# Patient Record
Sex: Female | Born: 1945 | Race: White | Hispanic: No | Marital: Married | Smoking: Current some day smoker
Health system: Southern US, Community
[De-identification: ages and names within clinical notes are randomized; demographics above are authoritative.]

## PROBLEM LIST (undated history)

## (undated) DIAGNOSIS — I10 Essential (primary) hypertension: Secondary | ICD-10-CM

## (undated) DIAGNOSIS — J309 Allergic rhinitis, unspecified: Secondary | ICD-10-CM

## (undated) DIAGNOSIS — IMO0002 Reserved for concepts with insufficient information to code with codable children: Secondary | ICD-10-CM

## (undated) DIAGNOSIS — K449 Diaphragmatic hernia without obstruction or gangrene: Secondary | ICD-10-CM

## (undated) DIAGNOSIS — N3020 Other chronic cystitis without hematuria: Secondary | ICD-10-CM

## (undated) DIAGNOSIS — D1803 Hemangioma of intra-abdominal structures: Secondary | ICD-10-CM

## (undated) DIAGNOSIS — K2289 Other specified disease of esophagus: Secondary | ICD-10-CM

## (undated) DIAGNOSIS — Z8719 Personal history of other diseases of the digestive system: Secondary | ICD-10-CM

## (undated) DIAGNOSIS — E43 Unspecified severe protein-calorie malnutrition: Secondary | ICD-10-CM

## (undated) DIAGNOSIS — K219 Gastro-esophageal reflux disease without esophagitis: Secondary | ICD-10-CM

## (undated) DIAGNOSIS — I209 Angina pectoris, unspecified: Secondary | ICD-10-CM

## (undated) DIAGNOSIS — I712 Thoracic aortic aneurysm, without rupture, unspecified: Secondary | ICD-10-CM

## (undated) HISTORY — PX: WISDOM TOOTH EXTRACTION: SHX21

## (undated) HISTORY — DX: Allergic rhinitis, unspecified: J30.9

## (undated) HISTORY — DX: Diaphragmatic hernia without obstruction or gangrene: K44.9

## (undated) HISTORY — DX: Hemangioma of intra-abdominal structures: D18.03

## (undated) HISTORY — DX: Thoracic aortic aneurysm, without rupture, unspecified: I71.20

## (undated) HISTORY — DX: Essential (primary) hypertension: I10

## (undated) HISTORY — DX: Gastro-esophageal reflux disease without esophagitis: K21.9

## (undated) HISTORY — DX: Unspecified severe protein-calorie malnutrition: E43

## (undated) HISTORY — PX: ABDOMINAL HYSTERECTOMY: SHX81

## (undated) HISTORY — PX: CHOLECYSTECTOMY: SHX55

## (undated) HISTORY — DX: Other specified disease of esophagus: K22.89

---

## 1998-05-13 ENCOUNTER — Other Ambulatory Visit: Admission: RE | Admit: 1998-05-13 | Discharge: 1998-05-13 | Payer: Self-pay | Admitting: Gynecology

## 1999-08-04 ENCOUNTER — Emergency Department (HOSPITAL_COMMUNITY): Admission: EM | Admit: 1999-08-04 | Discharge: 1999-08-04 | Payer: Self-pay | Admitting: Emergency Medicine

## 1999-08-04 ENCOUNTER — Encounter: Payer: Self-pay | Admitting: Emergency Medicine

## 2000-05-20 ENCOUNTER — Other Ambulatory Visit: Admission: RE | Admit: 2000-05-20 | Discharge: 2000-05-20 | Payer: Self-pay | Admitting: Gynecology

## 2000-06-29 ENCOUNTER — Encounter: Payer: Self-pay | Admitting: Internal Medicine

## 2000-06-29 ENCOUNTER — Encounter: Admission: RE | Admit: 2000-06-29 | Discharge: 2000-06-29 | Payer: Self-pay | Admitting: Internal Medicine

## 2000-07-06 ENCOUNTER — Encounter: Admission: RE | Admit: 2000-07-06 | Discharge: 2000-07-06 | Payer: Self-pay | Admitting: Internal Medicine

## 2000-07-06 ENCOUNTER — Encounter: Payer: Self-pay | Admitting: Internal Medicine

## 2000-07-12 ENCOUNTER — Ambulatory Visit (HOSPITAL_COMMUNITY): Admission: RE | Admit: 2000-07-12 | Discharge: 2000-07-13 | Payer: Self-pay | Admitting: General Surgery

## 2000-07-12 ENCOUNTER — Encounter: Payer: Self-pay | Admitting: General Surgery

## 2000-07-12 ENCOUNTER — Encounter (INDEPENDENT_AMBULATORY_CARE_PROVIDER_SITE_OTHER): Payer: Self-pay | Admitting: *Deleted

## 2001-02-21 ENCOUNTER — Observation Stay (HOSPITAL_COMMUNITY): Admission: RE | Admit: 2001-02-21 | Discharge: 2001-02-22 | Payer: Self-pay | Admitting: Urology

## 2001-06-01 ENCOUNTER — Other Ambulatory Visit: Admission: RE | Admit: 2001-06-01 | Discharge: 2001-06-01 | Payer: Self-pay | Admitting: Obstetrics and Gynecology

## 2002-05-11 ENCOUNTER — Ambulatory Visit (HOSPITAL_COMMUNITY): Admission: RE | Admit: 2002-05-11 | Discharge: 2002-05-11 | Payer: Self-pay | Admitting: Gastroenterology

## 2002-05-11 ENCOUNTER — Encounter (INDEPENDENT_AMBULATORY_CARE_PROVIDER_SITE_OTHER): Payer: Self-pay | Admitting: Specialist

## 2003-03-08 ENCOUNTER — Encounter: Admission: RE | Admit: 2003-03-08 | Discharge: 2003-03-08 | Payer: Self-pay | Admitting: Internal Medicine

## 2003-03-08 ENCOUNTER — Encounter: Payer: Self-pay | Admitting: Internal Medicine

## 2004-08-22 ENCOUNTER — Ambulatory Visit: Payer: Self-pay | Admitting: Family Medicine

## 2005-06-04 ENCOUNTER — Ambulatory Visit: Payer: Self-pay | Admitting: Family Medicine

## 2005-06-08 ENCOUNTER — Ambulatory Visit: Payer: Self-pay | Admitting: Family Medicine

## 2005-06-30 ENCOUNTER — Ambulatory Visit: Payer: Self-pay | Admitting: Family Medicine

## 2005-08-07 ENCOUNTER — Ambulatory Visit: Payer: Self-pay | Admitting: Family Medicine

## 2005-10-02 ENCOUNTER — Ambulatory Visit: Payer: Self-pay | Admitting: Family Medicine

## 2009-08-19 ENCOUNTER — Encounter: Admission: RE | Admit: 2009-08-19 | Discharge: 2009-08-19 | Payer: Self-pay | Admitting: Internal Medicine

## 2010-12-19 NOTE — Op Note (Signed)
[facility]  Patient:    Julie Day, Julie Day                       MRN: [id number] Proc. Date: 02/21/01 Adm. Date:  [id number] Attending:  Laqueta Jean                           Operative Report  PREOPERATIVE DIAGNOSIS:  Pelvic prolapse.  POSTOPERATIVE DIAGNOSES:  Urethrocele, cystocele, and enterocele.  OPERATION PERFORMED:  Pubovaginal sling using tutoplast "T" shaped fascia with anterior vaginal vault repair, and enterocele repair.  SURGEON:  Dr. Patsi Sears.  ANESTHESIA:  General endotracheal.  PREPARATION:  After appropriate preanesthesia, the patient was brought to the operating room and placed on the operating table in the dorsal supine position where general endotracheal anesthesia was introduced. She was then replaced in the low Allen stirrup dorsal lithotomy position where the pubis was prepped with Betadine solution and draped in the usual fashion.  DESCRIPTION OF PROCEDURE:  The 10 cc of Marcaine 0.25 with epinephrine 1:200,000 was injected into the large cystourethrocele area. Evaluation showed a large anterior vaginal vault prolapse. A Foley catheter was placed, and an incision was made from the mid urethra, to the scar from where the cervix had been removed during hysterectomy. The subcutaneous tissue was dissected with blunt and sharp dissection, and following this, a 2-0 PDS suture was placed through the remaining portion of the cardinal ligament. This was sutured in a horizontal mattress fashion.  A "T" shaped portion of tutoplast was then fashioned from a 6 x 8 rectangular piece of mentor fascia, and retropubic anchors were placed in a standard fashion w #1 Prolene sutures attached. A "T" shaped sling was then formed, and sutured in place with a right angle clamp around the fascia at the level of the urethra, to ensure that the sling was not sutured too tight.  It was rechecked again to make sure that the fascia was loose.  Following this, the "T" shaped portion of the tissue was sutured over the anterior vaginal vault repair, which was repaired with horizontal mattress 3-0 Vicryl sutures. Following this, the denuded vaginal epithelium was excised, and subcutaneous sutures and 3-0 Vicryl pop-offs were used and then the vaginal epithelium was was closed with inverted running 3-0 Vicryl suture.  A second large bulge was then identified, in the area of the enterocele and rectocele. The 10 more cc of 0.25 Marcaine with epinephrine 1:200,000 was injected, and a midline incision was made and subcutaneous tissue dissected with blunt and sharp dissection. This revealed a large enterocele and no rectocele. The enterocele sac was dissected, and a large circumferential 3-0 Vicryl suture was placed, and the enterocele was pushed back proximal-ward, and the suture ligated. Two portions of fascia then were placed in the vault area, and the denuded vaginal epithelium was again excised, and 3-0 Vicryl suture was then used in the subcutaneous tissue, and running 3-0 Vicryl suture was used to close the epithelial portion of the vaginal vault.  The patient tolerated the procedure well with mild to moderate bleeding. She remained stable during the procedure. There was no rectocele appreciated.  Following this, cystoscopy was accomplished, which showed normal trigone, and photo documentation was accomplished. Following this, a Foley catheter was placed, and vaginal packing was placed. The patient was awakened and taken to the recovery room in good condition. DD:  02/21/01 TD:  02/21/01 Job: [id number]

## 2010-12-19 NOTE — Op Note (Signed)
   NAME:  Julie Day, MEAS NO.:  [id number]   MEDICAL RECORD NO.:  [id number]                   PATIENT TYPE:  AMB   LOCATION:  ENDO                                 FACILITY:  [facility]   PHYSICIAN:  Danise Edge, M.D.                DATE OF BIRTH:  1946/01/04   DATE OF PROCEDURE:  05/11/2002  DATE OF DISCHARGE:                                 OPERATIVE REPORT   PROCEDURE:  Screening colonoscopy with polypectomy.   INDICATIONS:  The patient is a 65 year old female born 2045/12/23.  The  patient is scheduled to undergo her first screening colonoscopy with  polypectomy to prevent colon cancer.   ENDOSCOPIST:  Danise Edge, M.D.   PREMEDICATION:  Fentanyl 75 mcg, Versed 10 mg.   ENDOSCOPE:  Olympus pediatric colonoscope.   DESCRIPTION OF PROCEDURE:  After obtaining informed consent, the patient was  placed in the left lateral decubitus position.  I administered intravenous  fentanyl and intravenous Versed to achieve conscious sedation for the  procedure.  The patient's blood pressure, oxygen saturation, and cardiac  rhythm were monitored throughout the procedure and documented in the medical  record.   Anal inspection was normal.  Digital rectal exam was normal.  The Olympus  pediatric video colonoscope was introduced into the rectum and advanced to  the cecum.  Colonic preparation for the exam today was excellent.   Rectum and sigmoid colon:  Four 1 mm sessile polyps were removed from the  rectum and distal sigmoid colon with the hot biopsy forceps.  All polyps  were submitted in one bottle for pathologic evaluation.   Descending colon normal.   Splenic flexure normal.   Transverse colon normal.   Hepatic flexure normal.   Ascending colon normal.   Cecum and ileocecal valve normal.   ASSESSMENT:  Four 1 mm sessile polyps were removed from the distal sigmoid  colon and rectum.  All polyps were submitted in one bottle for pathologic  evaluation.    RECOMMENDATIONS:  If polyps return neoplastic pathologically, the patient  should undergo a repeat colonoscopy in five years.  If the polyps are non-  neoplastic pathologically, the patient should undergo a repeat colonoscopy  in approximately 10 years assuming her yearly stool Hemoccult card tests are  negative.                                                Danise Edge, M.D.    MJ/MEDQ  D:  05/11/2002  T:  05/11/2002  Job:  [id number]   cc:   Thora Lance, M.D.  [address] [place]  [place] [address]  Fax: [phone]

## 2012-10-20 ENCOUNTER — Other Ambulatory Visit: Payer: Self-pay | Admitting: Internal Medicine

## 2012-10-20 DIAGNOSIS — M5432 Sciatica, left side: Secondary | ICD-10-CM

## 2012-10-26 ENCOUNTER — Ambulatory Visit
Admission: RE | Admit: 2012-10-26 | Discharge: 2012-10-26 | Disposition: A | Discharge status: Home / Self Care | Payer: Medicare Other | Source: Ambulatory Visit | Attending: Internal Medicine | Admitting: Internal Medicine

## 2012-10-26 DIAGNOSIS — M5432 Sciatica, left side: Secondary | ICD-10-CM

## 2013-07-09 ENCOUNTER — Emergency Department (HOSPITAL_COMMUNITY): Payer: Medicare Other

## 2013-07-09 ENCOUNTER — Emergency Department (HOSPITAL_COMMUNITY)
Admission: EM | Admit: 2013-07-09 | Discharge: 2013-07-09 | Disposition: A | Discharge status: Home / Self Care | Payer: Medicare Other | Attending: Emergency Medicine | Admitting: Emergency Medicine

## 2013-07-09 ENCOUNTER — Encounter (HOSPITAL_COMMUNITY): Payer: Self-pay | Admitting: Emergency Medicine

## 2013-07-09 DIAGNOSIS — Z885 Allergy status to narcotic agent status: Secondary | ICD-10-CM | POA: Insufficient documentation

## 2013-07-09 DIAGNOSIS — M62830 Muscle spasm of back: Secondary | ICD-10-CM

## 2013-07-09 DIAGNOSIS — M79609 Pain in unspecified limb: Secondary | ICD-10-CM | POA: Insufficient documentation

## 2013-07-09 DIAGNOSIS — Z79899 Other long term (current) drug therapy: Secondary | ICD-10-CM | POA: Insufficient documentation

## 2013-07-09 DIAGNOSIS — IMO0002 Reserved for concepts with insufficient information to code with codable children: Secondary | ICD-10-CM | POA: Insufficient documentation

## 2013-07-09 DIAGNOSIS — Z87440 Personal history of urinary (tract) infections: Secondary | ICD-10-CM | POA: Insufficient documentation

## 2013-07-09 DIAGNOSIS — M62838 Other muscle spasm: Secondary | ICD-10-CM | POA: Insufficient documentation

## 2013-07-09 DIAGNOSIS — F17200 Nicotine dependence, unspecified, uncomplicated: Secondary | ICD-10-CM | POA: Insufficient documentation

## 2013-07-09 HISTORY — DX: Reserved for concepts with insufficient information to code with codable children: IMO0002

## 2013-07-09 HISTORY — DX: Other chronic cystitis without hematuria: N30.20

## 2013-07-09 MED ORDER — METHOCARBAMOL 500 MG PO TABS: 500.0000 mg | ORAL_TABLET | Freq: Two times a day (BID) | ORAL | Status: DC

## 2013-07-09 NOTE — ED Provider Notes (Signed)
CSN: [id number]     Arrival date & time 07/09/13  1129 History  This chart was scribed for Raymon Mutton, PA working with Ethelda Chick, MD by Quintella Reichert, ED Scribe. This patient was seen in room TR06C/TR06C and the patient's care was started at 2:07 PM.  Chief Complaint  Patient presents with  . Back Pain    The history is provided by the patient and medical records. No language interpreter was used.    HPI Comments: Julie Day is a 67 y.o. female with h/o degenerative disc disease who presents to the Emergency Department complaining of severe left-sided lower back pain that began 2 day ago.  Pt states she has "horrible" spasms in her back whenever she tries to stand up or get out of bed.  Pain is predominantly on the left side but extends all the way across her lower back.  Pt does have a h/o back pain due to degenerative disc disease but states her current pain feels more muscular.  She denies recent falls or injuries but notes that 4 days ago she was sitting on the floor playing with her granddaughter.  She denies weakness, numbness or tingling in legs.  She denies bowel or bladder dysfunction.  Pt has a PCP and states that he is aware of her back pain.  She is not taking any medications for back pain currenlty.  She was referred to an orthopedist for an MRI and received an epidural in April 2014 which provided significant relief.  She has not seen her orthopedist since then.  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  PCP: Lillia Mountain, MD   Past Medical History  Diagnosis Date  . Degenerative disc disease   . Chronic cystitis     Past Surgical History  Procedure Laterality Date  . Abdominal hysterectomy    . Cholecystectomy      History reviewed. No pertinent family history.   History  Substance Use Topics  .  Smoking status: Current Every Day Smoker    Types: Cigarettes  . Smokeless tobacco: Not on file  . Alcohol Use: No    OB History   Grav Para Term Preterm Abortions TAB SAB Ect Mult Living                  Review of Systems  Gastrointestinal: Negative for diarrhea.  Genitourinary: Negative for enuresis.  Musculoskeletal: Positive for back pain.  Neurological: Negative for weakness and numbness.  All other systems reviewed and are negative.     Allergies  Codeine  Home Medications   Current Outpatient Rx  Name  Route  Sig  Dispense  Refill  . acetaminophen (TYLENOL) 325 MG tablet   Oral   Take 650 mg by mouth every 6 (six) hours as needed for moderate pain.         . Calcium Citrate-Vitamin D (CITRACAL + D PO)   Oral   Take 1 tablet by mouth daily.         . cetirizine (ZYRTEC) 10 MG tablet   Oral   Take 10 mg by mouth daily.         Marland Kitchen estradiol (ESTRACE) 0.5 MG tablet   Oral   Take 0.5 mg by mouth daily.         Marland Kitchen ibuprofen (ADVIL,MOTRIN) 200 MG tablet   Oral  Take 400 mg by mouth every 6 (six) hours as needed for moderate pain.         Marland Kitchen lansoprazole (PREVACID) 30 MG capsule   Oral   Take 30 mg by mouth every evening.         . mirabegron ER (MYRBETRIQ) 50 MG TB24 tablet   Oral   Take 50 mg by mouth at bedtime.         . Omega-3 Fatty Acids (FISH OIL) 1200 MG CAPS   Oral   Take 2,400 mg by mouth daily.         Marland Kitchen OVER THE COUNTER MEDICATION   Oral   Take 100 mg by mouth daily. Stool Softner         . triamterene-hydrochlorothiazide (MAXZIDE-25) 37.5-25 MG per tablet   Oral   Take 1 tablet by mouth every evening.         . methocarbamol (ROBAXIN) 500 MG tablet   Oral   Take 1 tablet (500 mg total) by mouth 2 (two) times daily.   20 tablet   0     BP 139/89  Pulse 62  Temp(Src) 97.8 F (36.6 C) (Oral)  Resp 20  SpO2 99%  Physical Exam  Nursing note and vitals reviewed. Constitutional: She is oriented to person,  place, and time. She appears well-developed and well-nourished. No distress.  HENT:  Head: Normocephalic and atraumatic.  Eyes: Conjunctivae and EOM are normal. Pupils are equal, round, and reactive to light. Right eye exhibits no discharge. Left eye exhibits no discharge.  Neck: Normal range of motion. Neck supple.  Negative neck stiffness Negative nuchal rigidity  Negative pain upon palpation to the cervical spine   Cardiovascular: Normal rate, regular rhythm and normal heart sounds.  Exam reveals no friction rub.   No murmur heard. Pulmonary/Chest: Effort normal and breath sounds normal. No respiratory distress. She has no wheezes. She has no rales.  Musculoskeletal: Normal range of motion. She exhibits tenderness.       Back:  Negative bulging, erythema, inflammation, deformities noted to the lumbosacral spine-discomfort upon palpation to mid spinal region. Discomfort upon palpation to lumbosacral spine, mid spinal and bilateral paravertebral regions. Full range of motion to upper and lower extremities identified without difficulty.  Neurological: She is alert and oriented to person, place, and time. She exhibits normal muscle tone. Coordination normal.  Strength 5+5+ upper and lower tremors bilaterally with resistance applied, equal distribution noted Sensation intact  Skin: Skin is warm and dry. She is not diaphoretic.  Psychiatric: She has a normal mood and affect. Her behavior is normal. Thought content normal.    ED Course  Procedures (including critical care time)  DIAGNOSTIC STUDIES: Oxygen Saturation is 99% on room air, normal by my interpretation.    COORDINATION OF CARE: 2:13 PM-Discussed treatment plan which includes muscle relaxer, heat, massage, and f/u with PCP and orthopedics.  Advised return precautions.  Pt expressed understanding and agreed with plan.  Dg Lumbar Spine Complete  07/09/2013   CLINICAL DATA:  Lower back and left leg pain.  EXAM: LUMBAR SPINE -  COMPLETE 4+ VIEW  COMPARISON:  None.  FINDINGS: No fracture or spondylolisthesis is noted. Mild degenerative disc disease is noted at L2-3, L3-4 and L4-5. Minimal levoscoliosis of lumbar spine is noted. Status post cholecystectomy. Mild degenerative disc disease is seen involving the posterior facet joints of L4-5 and L5-S1.  IMPRESSION: Mild multilevel degenerative disc disease. No acute abnormality seen in the lumbar spine.  Electronically Signed   By: Roque Lias M.D.   On: 07/09/2013 13:33   *RADIOLOGY REPORT*  Clinical Data: Left lower extremity radiculopathy. Pain begins in  the groin radiates to the ankle. Left sciatica.  MRI LUMBAR SPINE WITHOUT CONTRAST  Technique: Multiplanar and multiecho pulse sequences of the lumbar  spine were obtained without intravenous contrast.  Comparison: None.  Findings: Normal conus tip at L2. Normal paraspinal soft tissues.  T12-L1 and L1-2: Normal.  L2-3: Slight disc space narrowing. Tiny broad-based disc bulge  with no neural impingement.  L3-4: Slight disc space narrowing. Small broad-based disc bulge  with no neural impingement.  L4-5: Disc space narrowing. Small broad-based disc bulge with  accompanying osteophytes most prominent lateral to the right neural  foramen adjacent to but not compressing the L4 nerve after it has  exited the neural foramen. Slight degenerative changes of the  right facet joint.  L5 S1: Tiny central annular tear and central disc bulge with no  neural impingement. The bulge extends to the left but there is no  neural impingement. Moderate bilateral facet arthritis.  IMPRESSION:  1. 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 Asymmetric bulge  of the disc to the left without neural impingement at L5-S1.  2. No other significant abnormalities.  Original Report Authenticated By: Francene Boyers, M.D.   Labs Review Labs Reviewed - No data to display   Imaging Review Dg Lumbar Spine Complete  07/09/2013   CLINICAL  DATA:  Lower back and left leg pain.  EXAM: LUMBAR SPINE - COMPLETE 4+ VIEW  COMPARISON:  None.  FINDINGS: No fracture or spondylolisthesis is noted. Mild degenerative disc disease is noted at L2-3, L3-4 and L4-5. Minimal levoscoliosis of lumbar spine is noted. Status post cholecystectomy. Mild degenerative disc disease is seen involving the posterior facet joints of L4-5 and L5-S1.  IMPRESSION: Mild multilevel degenerative disc disease. No acute abnormality seen in the lumbar spine.   Electronically Signed   By: Roque Lias M.D.   On: 07/09/2013 13:33    EKG Interpretation   None       MDM   1. Muscle spasm of back     Filed Vitals:   07/09/13 1144  BP: 139/89  Pulse: 62  Temp: 97.8 F (36.6 C)  TempSrc: Oral  Resp: 20  SpO2: 99%   I personally performed the services described in this documentation, which was scribed in my presence. The recorded information has been reviewed and is accurate.  Patient presenting to the ED with low back pain that has been ongoing for the past couple of days, described as more of a muscular discomfort. Reported that she has history of DDD and back pain intermittently. This provider reviewed the patient's chart. Patient has a history of back pain. Most recent MRI of the lumbar spine noted moderate bilateral facet arthritis at L5-S1 with asymmetric bulging of the disc to the left without neural impingement at L5-S1. Patient reported that she was being seen by an orthopedic where epidural shots were administered for pain control. Reported that her last dose was in 11/2012.  Alert and oriented. GCS 15. Heart rate and rhythm normal. Lungs clear to auscultation to upper and lower lobes. Negative deformities noted to the spine. Discomfort upon palpation to the lumbosacral spine - mid-spinal and bilateral paravertebral. Full ROM to upper and lower extremities bilaterally. Mild discomfort noted with motion - sitting to standing position, flexion of the torso.  Strength intact with resistance, equal distribution noted. Negative  neurological deficits noted.  Plain film of lumbar spine noted DDD. Patient had chronic back pain - acute exacerbation of chronic back pain, suspicion high for muscular discomfort secondary to pain with palpation and motion. Doubt cauda equina. Doubt epidural abscess. Doubt nephrolithasis. Patient stable, afebrile. Discharged patient. Discharged patient with muscle relaxers. Referred patient to PCP and orthopedics. Discussed with patient to rest, stay hydrated, avoid strenuous activity. Discussed with patient to continue to monitor symptoms closely and if symptoms are to worsen or change to report back to the ED - strict return instructions given.  Patient agreed to plan of care, understood, all questions answered.   Raymon Mutton, PA-C 07/11/13 1349

## 2013-07-09 NOTE — ED Notes (Signed)
Pt reports left side lower back pain since Friday, hx of back pain. Denies injury or fall, ambulatory at triage.

## 2013-07-13 NOTE — ED Provider Notes (Signed)
Medical screening examination/treatment/procedure(s) were performed by non-physician practitioner and as supervising physician I was immediately available for consultation/collaboration.  EKG Interpretation   None        Martha K Linker, MD 07/13/13 1504 

## 2013-07-18 ENCOUNTER — Ambulatory Visit: Payer: Medicare Other | Attending: Family Medicine | Admitting: Physical Therapy

## 2013-07-18 DIAGNOSIS — R293 Abnormal posture: Secondary | ICD-10-CM | POA: Insufficient documentation

## 2013-07-18 DIAGNOSIS — IMO0001 Reserved for inherently not codable concepts without codable children: Secondary | ICD-10-CM | POA: Insufficient documentation

## 2013-07-18 DIAGNOSIS — R5381 Other malaise: Secondary | ICD-10-CM | POA: Insufficient documentation

## 2013-07-18 DIAGNOSIS — M545 Low back pain, unspecified: Secondary | ICD-10-CM | POA: Insufficient documentation

## 2013-07-19 ENCOUNTER — Ambulatory Visit: Payer: Medicare Other | Admitting: Physical Therapy

## 2013-07-24 ENCOUNTER — Ambulatory Visit: Payer: Medicare Other | Admitting: Physical Therapy

## 2013-07-25 ENCOUNTER — Ambulatory Visit: Payer: Medicare Other | Admitting: Physical Therapy

## 2013-07-31 ENCOUNTER — Ambulatory Visit: Payer: Medicare Other | Admitting: *Deleted

## 2013-08-02 ENCOUNTER — Ambulatory Visit: Payer: Medicare Other | Admitting: *Deleted

## 2013-08-07 ENCOUNTER — Ambulatory Visit: Payer: Medicare Other | Attending: Family Medicine | Admitting: Physical Therapy

## 2013-08-07 DIAGNOSIS — R293 Abnormal posture: Secondary | ICD-10-CM | POA: Insufficient documentation

## 2013-08-07 DIAGNOSIS — M545 Low back pain, unspecified: Secondary | ICD-10-CM | POA: Insufficient documentation

## 2013-08-07 DIAGNOSIS — IMO0001 Reserved for inherently not codable concepts without codable children: Secondary | ICD-10-CM | POA: Insufficient documentation

## 2013-08-07 DIAGNOSIS — R5381 Other malaise: Secondary | ICD-10-CM | POA: Insufficient documentation

## 2013-08-10 ENCOUNTER — Ambulatory Visit: Payer: Medicare Other | Admitting: Physical Therapy

## 2013-08-14 ENCOUNTER — Ambulatory Visit: Payer: Medicare Other | Admitting: Physical Therapy

## 2013-08-17 ENCOUNTER — Ambulatory Visit: Payer: Medicare Other | Admitting: Physical Therapy

## 2013-08-21 ENCOUNTER — Ambulatory Visit: Payer: Medicare Other | Admitting: Physical Therapy

## 2013-08-24 ENCOUNTER — Ambulatory Visit: Payer: Medicare Other | Admitting: Physical Therapy

## 2015-02-13 ENCOUNTER — Ambulatory Visit (INDEPENDENT_AMBULATORY_CARE_PROVIDER_SITE_OTHER): Payer: Medicare HMO | Admitting: Nurse Practitioner

## 2015-02-13 ENCOUNTER — Other Ambulatory Visit: Payer: Self-pay | Admitting: Gastroenterology

## 2015-02-13 ENCOUNTER — Encounter: Payer: Self-pay | Admitting: Nurse Practitioner

## 2015-02-13 VITALS — BP 128/78 | HR 58 | Ht 63.0 in | Wt 149.2 lb

## 2015-02-13 DIAGNOSIS — R1310 Dysphagia, unspecified: Secondary | ICD-10-CM

## 2015-02-13 DIAGNOSIS — R9439 Abnormal result of other cardiovascular function study: Secondary | ICD-10-CM | POA: Diagnosis not present

## 2015-02-13 LAB — TROPONIN I: Troponin I: <0.01 ng/mL (ref <0.06)

## 2015-02-13 NOTE — Addendum Note (Signed)
Addended by: Eulis Foster on: 02/13/2015 04:20 PM   Modules accepted: Orders

## 2015-02-13 NOTE — Addendum Note (Signed)
Addended by: Eulis Foster on: 02/13/2015 04:21 PM   Modules accepted: Orders

## 2015-02-13 NOTE — Patient Instructions (Addendum)
We will be checking the following labs today - stat Troponin, BMET   Medication Instructions:    Continue with your current medicines.  Use your NTG under your tongue for recurrent chest pain. May take one tablet every 5 minutes. If you are still having discomfort after 3 tablets in 15 minutes, call 911.     Testing/Procedures To Be Arranged:  Stress echo  Exercise Stress Echocardiogram An exercise stress echocardiogram is a heart (cardiac) test used to check the function of your heart. This test may also be called an exercise stress echocardiography or stress echo. This stress test will check how well your heart muscle and valves are working and determine if your heart muscle is getting enough blood. You will exercise on a treadmill to naturally increase or stress the functioning of your heart.  An echocardiogram uses sound waves (ultrasound) to produce an image of your heart. If your heart does not work normally, it may indicate coronary artery disease with poor coronary blood supply. The coronary arteries are the arteries that bring blood and oxygen to your heart. LET [facility] CARE PROVIDER KNOW ABOUT:  Any allergies you have.  All medicines you are taking, including vitamins, herbs, eye drops, creams, and over-the-counter medicines.  Previous problems you or members of your family have had with the use of anesthetics.  Any blood disorders you have.  Previous surgeries you have had.  Medical conditions you have.  Possibility of pregnancy, if this applies. RISKS AND COMPLICATIONS Generally, this is a safe procedure. However, as with any procedure, complications can occur. Possible complications can include:  You develop pain or pressure in the following areas:  Chest.  Jaw or neck.  Between your shoulder blades.  Radiating down your left arm.  Dizziness or lightheadedness.  Shortness of breath.  Increased or irregular heartbeat.  Nausea or vomiting.  Heart  attack (rare). BEFORE THE PROCEDURE  Avoid all forms of caffeine for 24 hours before your test or as directed by your health care provider. This includes coffee, tea (even decaffeinated tea), caffeinated sodas, chocolate, cocoa, and certain pain medicines.  Follow your health care provider's instructions regarding eating and drinking before the test.  Take your medicines as directed at regular times with water unless instructed otherwise. Exceptions may include:  If you have diabetes, ask how you are to take your insulin or pills. It is common to adjust insulin dosing the morning of the test.  If you are taking beta-blocker medicines, it is important to talk to your health care provider about these medicines well before the date of your test. Taking beta-blocker medicines may interfere with the test. In some cases, these medicines need to be changed or stopped 24 hours or more before the test.  If you wear a nitroglycerin patch, it may need to be removed prior to the test. Ask your health care provider if the patch should be removed before the test.  If you use an inhaler for any breathing condition, bring it with you to the test.  If you are an outpatient, bring a snack so you can eat right after the stress phase of the test.  Do not smoke for 4 hours prior to the test or as directed by your health care provider.  Wear loose-fitting clothes and comfortable shoes for the test. This test involves walking on a treadmill. PROCEDURE   Multiple electrodes will be put on your chest. If needed, small areas of your chest may be shaved to  get better contact with the electrodes. Once the electrodes are attached to your body, multiple wires will be attached to the electrodes, and your heart rate will be monitored.  You will have an echocardiogram done at rest.  To produce this image of your heart, gel is applied to your chest, and a wand-like tool (transducer) is moved over the chest. The transducer  sends the sound waves through the chest to create the moving images of your heart.  You may need an IV to receive a medication that improves the quality of the pictures.  You will then walk on a treadmill. The treadmill will be started at a slow pace. The treadmill speed and incline will gradually be increased to raise your heart rate.  At the peak of exercise, the treadmill will be stopped. You will lie down immediately on a bed so that a second echocardiogram can be done to visualize your heart's motion with exercise.  The test usually takes 30-60 minutes to complete. AFTER THE PROCEDURE  Your heart rate and blood pressure will be monitored after the test.  You may return to your normal schedule, including diet, activities, and medicines, unless your health care provider tells you otherwise.  Follow-Up:   Will see how your test turns out.    Other Special Instructions:   No Smoking!!  Call the [place] office at [phone] if you have any questions, problems or concerns.

## 2015-02-13 NOTE — Progress Notes (Addendum)
CARDIOLOGY OFFICE NOTE  Date:  0/62/3762    [name] Date of Birth: 25-Apr-1946 Medical Record #[id number]  PCP:  Irven Shelling, MD  Cardiologist:  Johnsie Cancel (new)  Chief Complaint  Patient presents with  . Chest Pain    Work in new patient visit. Seen for Dr. Johnsie Cancel (DOD)    History of Present Illness: Julie Day is a 69 y.o. female who presents today for a new patient visit. Will establish with Dr. Johnsie Cancel (DOD today).   She has a history of HTN. No history of DM, HLD or known CAD. She has had longstanding GERD. She is a smoker - previously 1 pack a day - now just a rare cigarette. FH negative for CAD.   Presents from Dr. Wynetta Emery of GI - has had 6 to 8 weeks of chest heaviness. Notes that it just "hurts". Worse with lying down. Better if upright. Saw her PCP - had her PPI increased and then sent to GI. Symptoms continue - maybe getting worse - certainly no better. Will last 2 to 3 hours or even up to 6 hours. It will just go away. Nothing that she can do to bring on and nothing she can do to make go away. No associated symptoms whatsoever. Not short of breath. Not lightheaded, clammy, nauseated and no radiation. She has NTG - given to her by PCP - she has not taken.    Past Medical History  Diagnosis Date  . Degenerative disc disease   . Chronic cystitis   . Hypertension   . GERD (gastroesophageal reflux disease)   . Allergic rhinitis   . Hemangioma of liver     Past Surgical History  Procedure Laterality Date  . Abdominal hysterectomy    . Cholecystectomy       Medications: Current Outpatient Prescriptions  Medication Sig Dispense Refill  . Calcium Citrate-Vitamin D (CITRACAL + D PO) Take 1 tablet by mouth daily.    . cetirizine (ZYRTEC) 10 MG tablet Take 10 mg by mouth daily.    Marland Kitchen estradiol (ESTRACE) 0.5 MG tablet Take 0.5 mg by mouth daily.    . nitroGLYCERIN (NITROSTAT) 0.4 MG SL tablet Place 0.4 mg under the tongue every 5 (five) minutes as needed  for chest pain.    Marland Kitchen OVER THE COUNTER MEDICATION Take 100 mg by mouth daily. Stool Softner    . oxybutynin (DITROPAN-XL) 5 MG 24 hr tablet Take 5 mg by mouth at bedtime.    . pantoprazole (PROTONIX) 40 MG tablet Take 40 mg by mouth 2 (two) times daily.    Marland Kitchen triamterene-hydrochlorothiazide (MAXZIDE-25) 37.5-25 MG per tablet Take 1 tablet by mouth every evening.     No current facility-administered medications for this visit.    Allergies: Allergies  Allergen Reactions  . Codeine Nausea And Vomiting    Social History: The patient  reports that she has been smoking Cigarettes.  She does not have any smokeless tobacco history on file. She reports that she does not drink alcohol or use illicit drugs.   Family History: The patient's family history includes Hypertension in her mother and sister.   Review of Systems: Please see the history of present illness.   Otherwise, the review of systems is positive for none.   All other systems are reviewed and negative.   Physical Exam: VS:  BP 128/78 mmHg  Pulse 58  Ht 5\' 3"  (1.6 m)  Wt 149 lb 3.2 oz (67.677 kg)  BMI 26.44 kg/m2 .  BMI Body mass index is 26.44 kg/(m^2).  Wt Readings from Last 3 Encounters:  02/13/15 149 lb 3.2 oz (67.677 kg)    General: Pleasant. Well developed, well nourished and in no acute distress.  HEENT: Normal. Neck: Supple, no JVD, carotid bruits, or masses noted.  Cardiac: Regular rate and rhythm. No murmurs, rubs, or gallops. No edema.  Respiratory:  Lungs are clear to auscultation bilaterally with normal work of breathing.  GI: Soft and nontender.  MS: No deformity or atrophy. Gait and ROM intact. Skin: Warm and dry. Color is normal.  Neuro:  Strength and sensation are intact and no gross focal deficits noted.  Psych: Alert, appropriate and with normal affect.   LABORATORY DATA:  EKG:  EKG is ordered today. This demonstrates sinus brady - rate of 58 - reviewed with Dr. Johnsie Cancel.  No results found for: WBC,  HGB, HCT, PLT, GLUCOSE, CHOL, TRIG, HDL, LDLDIRECT, LDLCALC, ALT, AST, NA, K, CL, CREATININE, BUN, CO2, TSH, PSA, INR, GLUF, HGBA1C, MICROALBUR  BNP (last 3 results) No results for input(s): BNP in the last 8760 hours.  ProBNP (last 3 results) No results for input(s): PROBNP in the last 8760 hours.   Other Studies Reviewed Today: N/A  Noted to have had negative stress test in 2001  Assessment/Plan: 1. Chest pain - her risk factors include HTN and smoking. Symptoms are more frequent. She has NTG on hand - she is encouraged to use prn - reminded how to use as well. Will check troponin today as well. Discussed with Dr. Johnsie Cancel - will proceed on with stress echo - scheduled for this Friday Am.   2. GERD - already on PPI - for barium swallow on Friday as well  3. HTN - BP good on current regimen.   4. Smoking - total cessation encouraged  Current medicines are reviewed with the patient today.  The patient does not have concerns regarding medicines other than what has been noted above.  The following changes have been made:  See above.  Labs/ tests ordered today include:    Orders Placed This Encounter  Procedures  . Basic metabolic panel  . Troponin I  . EKG 12-Lead  . ECHO STRESS TEST     Disposition:   FU based on outcomes of her testing.  Patient is agreeable to this plan and will call if any problems develop in the interim.   Signed: Burtis Junes, RN, ANP-C 02/13/2015 3:45 PM  [facility] [address] [place], [place]  [address] Phone: [phone] Fax: [phone]

## 2015-02-14 ENCOUNTER — Other Ambulatory Visit: Payer: Self-pay | Admitting: *Deleted

## 2015-02-14 ENCOUNTER — Telehealth (HOSPITAL_COMMUNITY): Payer: Self-pay

## 2015-02-14 ENCOUNTER — Telehealth: Payer: Self-pay | Admitting: *Deleted

## 2015-02-14 DIAGNOSIS — R079 Chest pain, unspecified: Secondary | ICD-10-CM

## 2015-02-14 LAB — BASIC METABOLIC PANEL
BUN: 13 mg/dL (ref 6–23)
CO2: 29 mEq/L (ref 19–32)
Calcium: 9.9 mg/dL (ref 8.4–10.5)
Chloride: 104 mEq/L (ref 96–112)
Creatinine, Ser: 0.91 mg/dL (ref 0.40–1.20)
GFR: 65.21 mL/min (ref >60.00)
Glucose, Bld: 84 mg/dL (ref 70–99)
Potassium: 3.8 mEq/L (ref 3.5–5.1)
Sodium: 141 mEq/L (ref 135–145)

## 2015-02-14 NOTE — Telephone Encounter (Signed)
Patient given detailed instructions per Stress Test Requisition Sheet for test on 02-15-2015 at 7:30am.Patient Notified to arrive 20 minutes early, and that it is imperative to arrive on time for appointment to keep from having the test rescheduled.  Patient verbalized understanding. Oletta Lamas, Bronwen Pendergraft A

## 2015-02-14 NOTE — Telephone Encounter (Signed)
Informed patient that insurance will not pay for stress echo testing. Will switch patient to GXT test - this insurance will pay for. Patient is relieved and agreeable to have test done prior to leaving [place] beach [place]. Was able to arrange testing tomorrow at hospital. Instructions given to patient and she verbalized understanding and is agreeable to changed plan.

## 2015-02-15 ENCOUNTER — Ambulatory Visit
Admission: RE | Admit: 2015-02-15 | Discharge: 2015-02-15 | Disposition: A | Discharge status: Home / Self Care | Payer: Medicare HMO | Source: Ambulatory Visit | Attending: Gastroenterology | Admitting: Gastroenterology

## 2015-02-15 ENCOUNTER — Ambulatory Visit (HOSPITAL_COMMUNITY)
Admission: RE | Admit: 2015-02-15 | Discharge: 2015-02-15 | Disposition: A | Discharge status: Home / Self Care | Payer: Medicare HMO | Source: Ambulatory Visit | Attending: Nurse Practitioner | Admitting: Nurse Practitioner

## 2015-02-15 ENCOUNTER — Ambulatory Visit (HOSPITAL_COMMUNITY): Payer: Medicare HMO

## 2015-02-15 DIAGNOSIS — R079 Chest pain, unspecified: Secondary | ICD-10-CM | POA: Diagnosis not present

## 2015-02-15 DIAGNOSIS — R1310 Dysphagia, unspecified: Secondary | ICD-10-CM

## 2015-02-20 ENCOUNTER — Other Ambulatory Visit: Payer: Self-pay | Admitting: Gastroenterology

## 2015-02-20 ENCOUNTER — Encounter (HOSPITAL_COMMUNITY): Payer: Self-pay | Admitting: *Deleted

## 2015-02-25 ENCOUNTER — Other Ambulatory Visit: Payer: Self-pay | Admitting: Gastroenterology

## 2015-02-26 ENCOUNTER — Ambulatory Visit (HOSPITAL_COMMUNITY): Payer: Medicare HMO | Admitting: Certified Registered Nurse Anesthetist

## 2015-02-26 ENCOUNTER — Encounter (HOSPITAL_COMMUNITY)
Admission: RE | Disposition: A | Discharge status: Home / Self Care | Payer: Self-pay | Source: Ambulatory Visit | Attending: Gastroenterology

## 2015-02-26 ENCOUNTER — Encounter (HOSPITAL_COMMUNITY): Payer: Self-pay

## 2015-02-26 ENCOUNTER — Ambulatory Visit (HOSPITAL_COMMUNITY)
Admission: RE | Admit: 2015-02-26 | Discharge: 2015-02-26 | Disposition: A | Discharge status: Home / Self Care | Payer: Medicare HMO | Source: Ambulatory Visit | Attending: Gastroenterology | Admitting: Gastroenterology

## 2015-02-26 DIAGNOSIS — Z79899 Other long term (current) drug therapy: Secondary | ICD-10-CM | POA: Diagnosis not present

## 2015-02-26 DIAGNOSIS — K440 Diaphragmatic hernia with obstruction, without gangrene: Secondary | ICD-10-CM | POA: Insufficient documentation

## 2015-02-26 DIAGNOSIS — J449 Chronic obstructive pulmonary disease, unspecified: Secondary | ICD-10-CM | POA: Diagnosis not present

## 2015-02-26 DIAGNOSIS — K219 Gastro-esophageal reflux disease without esophagitis: Secondary | ICD-10-CM | POA: Insufficient documentation

## 2015-02-26 DIAGNOSIS — R1319 Other dysphagia: Secondary | ICD-10-CM | POA: Diagnosis present

## 2015-02-26 DIAGNOSIS — Z85828 Personal history of other malignant neoplasm of skin: Secondary | ICD-10-CM | POA: Diagnosis not present

## 2015-02-26 DIAGNOSIS — R0789 Other chest pain: Secondary | ICD-10-CM | POA: Diagnosis present

## 2015-02-26 DIAGNOSIS — Z9049 Acquired absence of other specified parts of digestive tract: Secondary | ICD-10-CM | POA: Diagnosis not present

## 2015-02-26 DIAGNOSIS — I10 Essential (primary) hypertension: Secondary | ICD-10-CM | POA: Insufficient documentation

## 2015-02-26 DIAGNOSIS — Z87891 Personal history of nicotine dependence: Secondary | ICD-10-CM | POA: Insufficient documentation

## 2015-02-26 HISTORY — PX: ESOPHAGOGASTRODUODENOSCOPY (EGD) WITH PROPOFOL: SHX5813

## 2015-02-26 SURGERY — ESOPHAGOGASTRODUODENOSCOPY (EGD) WITH PROPOFOL
Anesthesia: Monitor Anesthesia Care

## 2015-02-26 MED ORDER — LACTATED RINGERS IV SOLN
INTRAVENOUS | Status: DC
  Administered 2015-02-26: 1000 mL via INTRAVENOUS

## 2015-02-26 MED ORDER — LIDOCAINE HCL (CARDIAC) 20 MG/ML IV SOLN
INTRAVENOUS | Status: AC
  Filled 2015-02-26: qty 5

## 2015-02-26 MED ORDER — SODIUM CHLORIDE 0.9 % IV SOLN: INTRAVENOUS | Status: DC

## 2015-02-26 MED ORDER — PROPOFOL 10 MG/ML IV BOLUS
INTRAVENOUS | Status: AC
  Filled 2015-02-26: qty 20

## 2015-02-26 MED ORDER — PROPOFOL 10 MG/ML IV BOLUS
INTRAVENOUS | Status: DC | PRN
  Administered 2015-02-26: 75 mg via INTRAVENOUS

## 2015-02-26 MED ORDER — LIDOCAINE HCL (CARDIAC) 20 MG/ML IV SOLN
INTRAVENOUS | Status: DC | PRN
  Administered 2015-02-26: 50 mg via INTRAVENOUS

## 2015-02-26 SURGICAL SUPPLY — 14 items

## 2015-02-26 NOTE — Discharge Instructions (Signed)
Conscious Sedation, Adult, Care After °Refer to this sheet in the next few weeks. These instructions provide you with information on caring for yourself after your procedure. Your health care provider may also give you more specific instructions. Your treatment has been planned according to current medical practices, but problems sometimes occur. Call your health care provider if you have any problems or questions after your procedure. °WHAT TO EXPECT AFTER THE PROCEDURE  °After your procedure: °· You may feel sleepy, clumsy, and have poor balance for several hours. °· Vomiting may occur if you eat too soon after the procedure. °HOME CARE INSTRUCTIONS °· Do not participate in any activities where you could become injured for at least 24 hours. Do not: °· Drive. °· Swim. °· Ride a bicycle. °· Operate heavy machinery. °· Cook. °· Use power tools. °· Climb ladders. °· Work from a high place. °· Do not make important decisions or sign legal documents until you are improved. °· If you vomit, drink water, juice, or soup when you can drink without vomiting. Make sure you have little or no nausea before eating solid foods. °· Only take over-the-counter or prescription medicines for pain, discomfort, or fever as directed by your health care provider. °· Make sure you and your family fully understand everything about the medicines given to you, including what side effects may occur. °· You should not drink alcohol, take sleeping pills, or take medicines that cause drowsiness for at least 24 hours. °· If you smoke, do not smoke without supervision. °· If you are feeling better, you may resume normal activities 24 hours after you were sedated. °· Keep all appointments with your health care provider. °SEEK MEDICAL CARE IF: °· Your skin is pale or bluish in color. °· You continue to feel nauseous or vomit. °· Your pain is getting worse and is not helped by medicine. °· You have bleeding or swelling. °· You are still sleepy or  feeling clumsy after 24 hours. °SEEK IMMEDIATE MEDICAL CARE IF: °· You develop a rash. °· You have difficulty breathing. °· You develop any type of allergic problem. °· You have a fever. °MAKE SURE YOU: °· Understand these instructions. °· Will watch your condition. °· Will get help right away if you are not doing well or get worse. °Document Released: 05/10/2013 Document Reviewed: 05/10/2013 °ExitCare® Patient Information ©2015 [employer]. This information is not intended to replace advice given to you by your health care provider. Make sure you discuss any questions you have with your health care provider. ° °Esophagogastroduodenoscopy °Care After °Refer to this sheet in the next few weeks. These instructions provide you with information on caring for yourself after your procedure. Your caregiver may also give you more specific instructions. Your treatment has been planned according to current medical practices, but problems sometimes occur. Call your caregiver if you have any problems or questions after your procedure.  °HOME CARE INSTRUCTIONS °· Do not eat or drink anything until the numbing medicine (local anesthetic) has worn off and your gag reflex has returned. You will know that the local anesthetic has worn off when you can swallow comfortably. °· Do not drive for 12 hours after the procedure or as directed by your caregiver. °· Only take medicines as directed by your caregiver. °SEEK MEDICAL CARE IF:  °· You cannot stop coughing. °· You are not urinating at all or less than usual. °SEEK IMMEDIATE MEDICAL CARE IF: °· You have difficulty swallowing. °· You cannot eat or drink. °·   You have worsening throat or chest pain. °· You have dizziness, lightheadedness, or you faint. °· You have nausea or vomiting. °· You have chills. °· You have a fever. °· You have severe abdominal pain. °· You have black, tarry, or bloody stools. °Document Released: 07/06/2012 Document Reviewed: 07/06/2012 °ExitCare® Patient  Information ©2015 [employer]. This information is not intended to replace advice given to you by your health care provider. Make sure you discuss any questions you have with your health care provider. ° °

## 2015-02-26 NOTE — Anesthesia Preprocedure Evaluation (Addendum)
Anesthesia Evaluation  Patient identified by MRN, date of birth, ID band Patient awake    Reviewed: Allergy & Precautions, NPO status , Patient's Chart, lab work & pertinent test results  History of Anesthesia Complications Negative for: history of anesthetic complications  Airway Mallampati: II  TM Distance: >3 FB Neck ROM: Full    Dental  (+) Chipped, Dental Advisory Given   Pulmonary COPDformer smoker (quit 2010),  breath sounds clear to auscultation        Cardiovascular hypertension, Pt. on medications - anginaRhythm:Regular Rate:Normal  02/15/15 exercise stress test: pt told was OK   Neuro/Psych negative neurological ROS     GI/Hepatic GERD-  Medicated and Controlled,Liver hemangioma   Endo/Other  negative endocrine ROS  Renal/GU negative Renal ROS     Musculoskeletal   Abdominal   Peds  Hematology negative hematology ROS (+)   Anesthesia Other Findings   Reproductive/Obstetrics                          Anesthesia Physical Anesthesia Plan  ASA: II  Anesthesia Plan: MAC   Post-op Pain Management:    Induction: Intravenous  Airway Management Planned: Nasal Cannula and Natural Airway  Additional Equipment:   Intra-op Plan:   Post-operative Plan:   Informed Consent: I have reviewed the patients History and Physical, chart, labs and discussed the procedure including the risks, benefits and alternatives for the proposed anesthesia with the patient or authorized representative who has indicated his/her understanding and acceptance.   Dental advisory given  Plan Discussed with: CRNA and Surgeon  Anesthesia Plan Comments: (Plan routine monitors, MAC)        Anesthesia Quick Evaluation

## 2015-02-26 NOTE — Transfer of Care (Signed)
Immediate Anesthesia Transfer of Care Note  Patient: Julie Day  Procedure(s) Performed: Procedure(s): ESOPHAGOGASTRODUODENOSCOPY (EGD) WITH PROPOFOL (N/A)  Patient Location: PACU  Anesthesia Type:MAC  Level of Consciousness: awake, alert  and oriented  Airway & Oxygen Therapy: Patient Spontanous Breathing and Patient connected to face mask oxygen  Post-op Assessment: Report given to RN and Post -op Vital signs reviewed and stable  Post vital signs: Reviewed and stable  Last Vitals:  Filed Vitals:   02/26/15 0701  BP: 142/59  Pulse: 63  Temp: 36.7 C  Resp: 11    Complications: No apparent anesthesia complications

## 2015-02-26 NOTE — H&P (Signed)
  Problem: Atypical chest pain with esophageal dysphagia. Barium esophagram showed an 11 cm hiatal hernia (the entire fundus of the stomach is in the chest) with intermittent tertiary contractions of the mid and distal esophagus. No esophageal stricture or esophageal obstruction was demonstrated. Normal screening colonoscopies performed in 2003 and 2013.  History: The patient is a 69 year old female born [birth year]. She has long-term gastroesophageal reflux treated with proton pump inhibitor therapy.  She has developed bouts of atypical chest pain. Her bouts of chest pain can occur with exertion as well as at rest. She will developed severe anterior retrosternal chest discomfort which is dull and pressure-like unassociated with nausea and heartburn. If she eats, the intensity of her chest discomfort diminishes and she occasionally experiences esophageal dysphagia without odynophagia.  There is no history of coronary artery disease. In 2001, she underwent a negative cardiac stress test for coronary artery disease.  The patient has been seen by her cardiologist. She underwent a barium esophagram with tablet which showed a large hiatal hernia (the entire fundus of her stomach is in the chest) associated with tertiary contractions of the mid-distal esophagus. No esophageal stricture or esophageal obstruction was demonstrated.  The patient is scheduled to undergo diagnostic esophagogastroduodenoscopy today.  Past medical history: Total abdominal hysterectomy for fibroids. Bilateral tubal ligation. Bladder tacking surgery. Laparoscopic cholecystectomy performed by Dr. Fanny Skates in 2001. Basal cell skin cancer removed from the right chest in 2002. Hypertension. Gastroesophageal reflux. Allergic rhinitis. Muscle contraction headaches. Liver hemangioma involving the right hepatic lobe. Baker's cyst of the left knee. Degenerative disc disease.  Medication allergies: Prevnar caused swelling and  fever  Exam: The patient is alert and lying comfortably on the endoscopy stretcher. Abdomen is soft and nontender to palpation. Lungs are clear to auscultation. Cardiac exam reveals a regular rhythm.  Plan: Proceed with diagnostic esophagogastroduodenoscopy. If the exam is normal, schedule esophageal manometry prior to referral for Nissen fundoplication.

## 2015-02-26 NOTE — Op Note (Signed)
Problems: Atypical chest pain with esophageal dysphagia. Barium esophagram with tablet showed an 11 cm hiatal hernia (the entire gastric fundus was in the chest). Tertiary contractions were present in the mid-distal esophagus. There was no esophageal stricture formation or esophageal obstruction. Normal screening colonoscopies performed in 2003 and 2013.   Endoscopist: Earle Gell  Premedication: Propofol administered by anesthesia  Procedure: Diagnostic esophagogastroduodenoscopy The patient was placed in the left lateral decubitus position. The Pentax gastroscope was passed through the posterior hypopharynx into the proximal esophagus without difficulty. The hypopharynx, larynx, and vocal cords appeared normal.  Esophagoscopy: The proximal, mid, and lower segments of the esophageal mucosa appeared normal. The squamocolumnar junction appeared regular and noted at 35 cm from the incisor teeth. There was no endoscopic evidence for the presence of erosive esophagitis, esophageal stricture formation, for Barrett's esophagus.  Gastroscopy: An 11 cm hiatal hernia was present. Retroflexed view of the gastric cardia and fundus was normal. The gastric body, antrum, and pylorus appeared normal. No gastric erosions were noted at the diaphragmatic hiatus.  Duodenoscopy: The duodenal bulb and descending duodenum appeared normal.  Assessment: Esophagogastroduodenoscopy showed an 11 cm hiatal hernia with normal esophageal, gastric, and duodenal mucosa.  Recommendation: Schedule high resolution esophageal manometry.

## 2015-02-26 NOTE — Anesthesia Postprocedure Evaluation (Signed)
  Anesthesia Post-op Note  Patient: Julie Day  Procedure(s) Performed: Procedure(s): ESOPHAGOGASTRODUODENOSCOPY (EGD) WITH PROPOFOL (N/A)  Patient Location: Endoscopy Unit  Anesthesia Type:MAC  Level of Consciousness: awake, alert , oriented and patient cooperative  Airway and Oxygen Therapy: Patient Spontanous Breathing  Post-op Pain: none  Post-op Assessment: Post-op Vital signs reviewed, Patient's Cardiovascular Status Stable, Respiratory Function Stable, Patent Airway, No signs of Nausea or vomiting, Pain level controlled and No headache              Post-op Vital Signs: Reviewed and stable  Last Vitals:  Filed Vitals:   02/26/15 0840  BP: 136/63  Pulse: 54  Temp:   Resp: 16    Complications: No apparent anesthesia complications

## 2015-02-26 NOTE — Transfer of Care (Signed)
Immediate Anesthesia Transfer of Care Note  Patient: Julie Day  Procedure(s) Performed: Procedure(s): ESOPHAGOGASTRODUODENOSCOPY (EGD) WITH PROPOFOL (N/A)  Patient Location: PACU  Anesthesia Type:MAC  Level of Consciousness: awake, alert  and oriented  Airway & Oxygen Therapy: Patient Spontanous Breathing and Patient connected to face mask oxygen  Post-op Assessment: Report given to RN and Post -op Vital signs reviewed and unstable, Anesthesiologist notified  Post vital signs: Reviewed and stable  Last Vitals:  Filed Vitals:   02/26/15 0701  BP: 142/59  Pulse: 63  Temp: 36.7 C  Resp: 11    Complications: No apparent anesthesia complications

## 2015-02-27 ENCOUNTER — Encounter (HOSPITAL_COMMUNITY)
Admission: RE | Disposition: A | Discharge status: Home / Self Care | Payer: Self-pay | Source: Ambulatory Visit | Attending: Gastroenterology

## 2015-02-27 ENCOUNTER — Ambulatory Visit (HOSPITAL_COMMUNITY)
Admission: RE | Admit: 2015-02-27 | Discharge: 2015-02-27 | Disposition: A | Discharge status: Home / Self Care | Payer: Medicare HMO | Source: Ambulatory Visit | Attending: Gastroenterology | Admitting: Gastroenterology

## 2015-02-27 ENCOUNTER — Encounter (HOSPITAL_COMMUNITY): Payer: Self-pay | Admitting: Gastroenterology

## 2015-02-27 DIAGNOSIS — K219 Gastro-esophageal reflux disease without esophagitis: Secondary | ICD-10-CM | POA: Diagnosis not present

## 2015-02-27 HISTORY — PX: ESOPHAGEAL MANOMETRY: SHX5429

## 2015-02-27 SURGERY — MANOMETRY, ESOPHAGUS

## 2015-02-27 MED ORDER — LIDOCAINE VISCOUS 2 % MT SOLN
OROMUCOSAL | Status: AC
  Filled 2015-02-27: qty 15

## 2015-02-27 SURGICAL SUPPLY — 2 items
FACESHIELD LNG OPTICON STERILE (SAFETY) IMPLANT
GLOVE BIO SURGEON STRL SZ8 (GLOVE) ×4 IMPLANT

## 2015-03-01 ENCOUNTER — Encounter (HOSPITAL_COMMUNITY): Payer: Self-pay | Admitting: Gastroenterology

## 2015-03-04 NOTE — H&P (Signed)
  Problems: Atypical chest pain with esophageal dysphagia. Barium esophagram showed an 11 cm hiatal hernia (the entire fundus of the stomach teasingly chest) with intermittent tertiary contractions of the mid and distal esophagus but no esophageal stricture or esophageal obstruction. 02/26/2015 esophagogastroduodenoscopy was normal except for the presence of a large hiatal hernia. Normal screening colonoscopies performed in 2003 and 2013.  Procedure: Preoperative high resolution esophageal manometry prior to possible hiatal hernia surgery  History: The patient is a 69 year old female born 02-07-1946. She has long-term gastroesophageal reflux treated with proton pump inhibitor therapy.  The patient has developed bouts of atypical chest pain. Her episodes of chest pain can occur with exertion as well as at rest. She experiences severe anterior retrosternal chest discomfort which is dull and pressure-like but unassociated with nausea and heartburn. If she consumes food, the intensity of her chest discomfort diminishes. Intermittently she experiences esophageal dysphagia without odynophagia.  There is no history of coronary artery disease. In 2001, she underwent a negative cardiac stress test for coronary artery disease.  She has been evaluated by her cardiologist.  Her episodes of chest pain may be related to gastric ischemia.  She is scheduled to undergo a preoperative high resolution esophageal manometry followed by referral to Dr. Fanny Skates to be evaluated for surgery.  Past medical history: Total abdominal hysterectomy for fibroids. Bilateral tubal ligation. Bladder tacking surgery. Laparoscopic cholecystectomy performed in 2001. Basal cell skin cancer removed from the right chest in 2002. Hypertension. Gastroesophageal reflux. Allergic rhinitis. Muscle contraction headaches. Liver hemangioma involving the right hepatic lobe. Baker's cyst of the left knee. Degenerative disc  disease.  Medication allergies: Prevnar caused swelling and fever  Plan: Proceed with preoperative high resolution esophageal manometry.

## 2015-04-04 ENCOUNTER — Other Ambulatory Visit: Payer: Self-pay | Admitting: General Surgery

## 2015-04-09 ENCOUNTER — Telehealth: Payer: Self-pay | Admitting: Nurse Practitioner

## 2015-04-09 NOTE — Telephone Encounter (Signed)
New message     Returning call regarding stress echo results.  They do not have results for this test or see where it was ever done

## 2015-04-09 NOTE — Telephone Encounter (Signed)
Call is from [place] @ [facility].  Pt did not have stress echo, according to notes she had exercise tolerance test.  Department closed at 5pm.  Unable to reach anyone at this time.

## 2015-04-10 NOTE — Telephone Encounter (Signed)
I spoke with Glenn/Sandy at [facility] E[facility], report finalized after provider goes in to [facility] and report is interpreted.

## 2015-04-10 NOTE — Telephone Encounter (Signed)
Julie Day aware.

## 2015-04-10 NOTE — Telephone Encounter (Signed)
I spoke to EKG at [facility] yesterday. This study still reads "in process" and is not completed.   Can you call back to [facility] and see if they can get this fixed?  Cecille Rubin

## 2015-05-15 DIAGNOSIS — K219 Gastro-esophageal reflux disease without esophagitis: Secondary | ICD-10-CM | POA: Diagnosis not present

## 2015-05-15 DIAGNOSIS — Z23 Encounter for immunization: Secondary | ICD-10-CM | POA: Diagnosis not present

## 2015-05-15 DIAGNOSIS — I10 Essential (primary) hypertension: Secondary | ICD-10-CM | POA: Diagnosis not present

## 2015-05-21 NOTE — Pre-Procedure Instructions (Signed)
    Julie Day  [birth year]      [facility] - [place], [place] - [address] [place] [address] Phone: [phone] Fax: [phone]    Your procedure is scheduled on 05/31/15.  Report to [facility] Admitting at 730 A.M.  Call this number if you have problems the morning of surgery:  [phone]   Remember:  Do not eat food or drink liquids after midnight.  Take these medicines the morning of surgery with A SIP OF WATER --zytrec,estrace,protonix   Do not wear jewelry, make-up or nail polish.  Do not wear lotions, powders, or perfumes.  You may wear deodorant.  Do not shave 48 hours prior to surgery.  Men may shave face and neck.  Do not bring valuables to the hospital.  [facility] is not responsible for any belongings or valuables.  Contacts, dentures or bridgework may not be worn into surgery.  Leave your suitcase in the car.  After surgery it may be brought to your room.  For patients admitted to the hospital, discharge time will be determined by your treatment team.  Patients discharged the day of surgery will not be allowed to drive home.   Name and phone number of your driver:   Special instructions:   Please read over the following fact sheets that you were given. Pain Booklet, Coughing and Deep Breathing and Surgical Site Infection Prevention

## 2015-05-22 ENCOUNTER — Encounter (HOSPITAL_COMMUNITY): Payer: Self-pay

## 2015-05-22 ENCOUNTER — Ambulatory Visit (HOSPITAL_COMMUNITY)
Admission: RE | Admit: 2015-05-22 | Discharge: 2015-05-22 | Disposition: A | Discharge status: Home / Self Care | Payer: Medicare HMO | Source: Ambulatory Visit | Attending: General Surgery | Admitting: General Surgery

## 2015-05-22 ENCOUNTER — Encounter (HOSPITAL_COMMUNITY)
Admission: RE | Admit: 2015-05-22 | Discharge: 2015-05-22 | Disposition: A | Discharge status: Home / Self Care | Payer: Medicare HMO | Source: Ambulatory Visit | Attending: General Surgery | Admitting: General Surgery

## 2015-05-22 DIAGNOSIS — Z01818 Encounter for other preprocedural examination: Secondary | ICD-10-CM | POA: Insufficient documentation

## 2015-05-22 DIAGNOSIS — Z01811 Encounter for preprocedural respiratory examination: Secondary | ICD-10-CM

## 2015-05-22 DIAGNOSIS — Z87891 Personal history of nicotine dependence: Secondary | ICD-10-CM | POA: Insufficient documentation

## 2015-05-22 DIAGNOSIS — R918 Other nonspecific abnormal finding of lung field: Secondary | ICD-10-CM | POA: Insufficient documentation

## 2015-05-22 DIAGNOSIS — K449 Diaphragmatic hernia without obstruction or gangrene: Secondary | ICD-10-CM | POA: Diagnosis not present

## 2015-05-22 DIAGNOSIS — D1809 Hemangioma of other sites: Secondary | ICD-10-CM | POA: Insufficient documentation

## 2015-05-22 DIAGNOSIS — Z01812 Encounter for preprocedural laboratory examination: Secondary | ICD-10-CM | POA: Insufficient documentation

## 2015-05-22 DIAGNOSIS — K219 Gastro-esophageal reflux disease without esophagitis: Secondary | ICD-10-CM | POA: Diagnosis not present

## 2015-05-22 LAB — CBC WITH DIFFERENTIAL/PLATELET
BASOS PCT: 1 %
Basophils Absolute: 0.1 10*3/uL (ref 0.0–0.1)
Eosinophils Absolute: 0.5 10*3/uL (ref 0.0–0.7)
Eosinophils Relative: 6 %
HEMATOCRIT: 42.6 % (ref 36.0–46.0)
HEMOGLOBIN: 14.5 g/dL (ref 12.0–15.0)
LYMPHS ABS: 2.6 10*3/uL (ref 0.7–4.0)
Lymphocytes Relative: 33 %
MCH: 30.9 pg (ref 26.0–34.0)
MCHC: 34 g/dL (ref 30.0–36.0)
MCV: 90.8 fL (ref 78.0–100.0)
MONO ABS: 0.4 10*3/uL (ref 0.1–1.0)
MONOS PCT: 5 %
NEUTROS ABS: 4.5 10*3/uL (ref 1.7–7.7)
Neutrophils Relative %: 55 %
PLATELETS: 239 10*3/uL (ref 150–400)
RBC: 4.69 MIL/uL (ref 3.87–5.11)
RDW: 13.7 % (ref 11.5–15.5)
WBC: 8 10*3/uL (ref 4.0–10.5)

## 2015-05-22 LAB — COMPREHENSIVE METABOLIC PANEL
ALT: 12 U/L — ABNORMAL LOW (ref 14–54)
AST: 22 U/L (ref 15–41)
Albumin: 3.9 g/dL (ref 3.5–5.0)
Alkaline Phosphatase: 69 U/L (ref 38–126)
Anion gap: 9 (ref 5–15)
BUN: 11 mg/dL (ref 6–20)
CHLORIDE: 100 mmol/L — AB (ref 101–111)
CO2: 29 mmol/L (ref 22–32)
Calcium: 10 mg/dL (ref 8.9–10.3)
Creatinine, Ser: 0.9 mg/dL (ref 0.44–1.00)
GFR calc Af Amer: >60 mL/min (ref >60)
Glucose, Bld: 86 mg/dL (ref 65–99)
POTASSIUM: 3.4 mmol/L — AB (ref 3.5–5.1)
SODIUM: 138 mmol/L (ref 135–145)
Total Bilirubin: 0.5 mg/dL (ref 0.3–1.2)
Total Protein: 7 g/dL (ref 6.5–8.1)

## 2015-05-22 NOTE — Progress Notes (Addendum)
Anesthesia Chart Review: Patient is a 69 year old female scheduled for laparoscopic repair of hiatal hernia, possible open on 05/31/15 by Dr. Dalbert Batman.  History includes former smoker, DDD, GERD, chronic cystitis, liver hemangioma, hysterectomy, cholecystectomy. PCP is Dr. Lavone Orn. She was seen at [facility] on 02/13/15 by Marcellina Millin, NP (Dr. Johnsie Cancel) for chest pain. She had a ETT which was reportedly normal.  02/13/15 EKG: SB  02/15/15 ETT: ECG: Baseline ECG is normal. At baseline, ECG shows </= 74mm ST depression in 2 or more leads. Stress Findings: The patient exercised following the Bruce protocol. The patient reported dyspnea and shortness of breath during the stress test. The patient experienced no angina during the stress test. The test was stopped because the patient complained of fatigue and shortness of breath. Blood pressure and heart rate demonstrated a normal response to exercise. Overall, the patient's exercise capacity was normal. The patient's response to exercise was adequate for diagnosis. Cardiologist interpretation pending. Response to Stress: Horizontal ST segment depression ST segment depression was noted during stress, and returning to baseline after less than 1 minute of recovery. Arrhythmias during stress: none.  Arrhythmias during recovery: none. There were no significant arrhythmias noted during the test. (The report is till not reading as finalized. I sent a staff message to Cecille Rubin to see if she can confirm that results were okay.)   05/22/15 CXR:  IMPRESSION: 1. Mild bilateral from interstitial prominence noted. Mild pneumonitis cannot be excluded. 2. Hiatal hernia again noted.  Preoperative labs noted.   Chart will be left for follow-up regarding finalized stress test results. If results are okay and no acute cardiopulmonary issues then I would anticipate that she could proceed as planned.  George Hugh [facility] Short Stay Center/Anesthesiology Phone [phone] 05/22/2015 6:07 PM  Addendum: Have communicated with both Cecille Rubin and with [facility] in [facility] EKG department. Dr. Johnsie Cancel did read her study and is aware of planned surgery next week. She had 2 mm upsloping ST depression in lateral leads at peak exercise with horizontal ST depression in recovery. He has recommended a nuclear stress test which has been scheduled for 05/28/15. I will send Dr. Dalbert Batman a staff message to update and leave chart for follow-up stress test results and clearance status.    George Hugh [facility] Short Stay Center/Anesthesiology Phone [phone] 05/23/2015 1:33 PM  Addendum:  Nuclear stress test today (05/28/15) showed:   - Nuclear stress EF: 65%.  - There was no ST segment deviation noted during stress.  - The study is normal.  - This is a low risk study.  George Hugh [facility] Short Stay Center/Anesthesiology Phone [phone] 05/28/2015 4:02 PM

## 2015-05-23 ENCOUNTER — Telehealth: Payer: Self-pay | Admitting: *Deleted

## 2015-05-23 ENCOUNTER — Other Ambulatory Visit: Payer: Self-pay | Admitting: *Deleted

## 2015-05-23 DIAGNOSIS — R079 Chest pain, unspecified: Secondary | ICD-10-CM

## 2015-05-23 LAB — EXERCISE TOLERANCE TEST
Estimated workload: 7 METS
Exercise duration (min): 5 min
Exercise duration (sec): 0 s
MPHR: 152 {beats}/min
Peak HR: 144 {beats}/min
Percent HR: 94 %
Rest HR: 68 {beats}/min

## 2015-05-23 NOTE — Telephone Encounter (Signed)
   Not sure what happenned to this study. I was asked to read it this morning. ETT was positive Apparently has surgery next week. Needs stress myovue tomorrow or early next week to clear for surgery    PT NOTIFIED ./CY

## 2015-05-23 NOTE — Telephone Encounter (Signed)
F/u       Pt calling back to speak to Altha Harm, pt has lots of questions about stress test.

## 2015-05-23 NOTE — Telephone Encounter (Signed)
ANSWERED QUESTIONS THAT PT HAD  RE   TESTS .Julie Day

## 2015-05-23 NOTE — Telephone Encounter (Signed)
STRESS MYOVIEW SCHEDULED  FOR  05-28-15 AT  7:15  AM./CY

## 2015-05-28 ENCOUNTER — Ambulatory Visit (HOSPITAL_COMMUNITY): Payer: Medicare HMO | Attending: Internal Medicine

## 2015-05-28 DIAGNOSIS — I10 Essential (primary) hypertension: Secondary | ICD-10-CM | POA: Diagnosis not present

## 2015-05-28 DIAGNOSIS — R079 Chest pain, unspecified: Secondary | ICD-10-CM | POA: Insufficient documentation

## 2015-05-28 DIAGNOSIS — R0609 Other forms of dyspnea: Secondary | ICD-10-CM | POA: Insufficient documentation

## 2015-05-28 LAB — MYOCARDIAL PERFUSION IMAGING
CHL CUP MPHR: 152 {beats}/min
CHL CUP NUCLEAR SDS: 5
CHL CUP NUCLEAR SRS: 1
CHL CUP NUCLEAR SSS: 6
CHL CUP RESTING HR STRESS: 61 {beats}/min
CHL CUP STRESS STAGE 1 DBP: 76 mmHg
CHL CUP STRESS STAGE 1 GRADE: 0 %
CHL CUP STRESS STAGE 2 DBP: 74 mmHg
CHL CUP STRESS STAGE 2 GRADE: 0 %
CHL CUP STRESS STAGE 2 HR: 71 {beats}/min
CHL CUP STRESS STAGE 2 SBP: 139 mmHg
CHL CUP STRESS STAGE 3 HR: 71 {beats}/min
CHL CUP STRESS STAGE 4 SBP: 148 mmHg
CHL CUP STRESS STAGE 4 SPEED: 1.7 mph
CHL CUP STRESS STAGE 5 GRADE: 12 %
CHL CUP STRESS STAGE 6 HR: 142 {beats}/min
CHL CUP STRESS STAGE 6 SPEED: 3.4 mph
CHL CUP STRESS STAGE 7 DBP: 80 mmHg
CHL CUP STRESS STAGE 7 GRADE: 0 %
CHL CUP STRESS STAGE 7 SBP: 160 mmHg
CHL CUP STRESS STAGE 7 SPEED: 0 mph
CHL CUP STRESS STAGE 8 DBP: 67 mmHg
CHL CUP STRESS STAGE 8 SBP: 104 mmHg
CSEPED: 7 min
CSEPEDS: 30 s
CSEPEW: 9.3 METS
CSEPHR: 94 %
CSEPPHR: 142 {beats}/min
LV dias vol: 63 mL
LV sys vol: 22 mL
NUC STRESS TID: 0.94
Percent of predicted max HR: 93 %
RATE: 0.34
Stage 1 HR: 73 {beats}/min
Stage 1 SBP: 139 mmHg
Stage 1 Speed: 0 mph
Stage 2 Speed: 0 mph
Stage 3 Grade: 0.1 %
Stage 3 Speed: 0 mph
Stage 4 DBP: 76 mmHg
Stage 4 Grade: 10 %
Stage 4 HR: 105 {beats}/min
Stage 5 DBP: 80 mmHg
Stage 5 HR: 125 {beats}/min
Stage 5 SBP: 155 mmHg
Stage 5 Speed: 2.5 mph
Stage 6 Grade: 14 %
Stage 7 HR: 121 {beats}/min
Stage 8 Grade: 0 %
Stage 8 HR: 84 {beats}/min
Stage 8 Speed: 0 mph

## 2015-05-28 MED ORDER — TECHNETIUM TC 99M SESTAMIBI GENERIC - CARDIOLITE
10.6000 | Freq: Once | INTRAVENOUS | Status: AC | PRN
  Administered 2015-05-28: 11 via INTRAVENOUS

## 2015-05-28 MED ORDER — TECHNETIUM TC 99M SESTAMIBI GENERIC - CARDIOLITE
33.0000 | Freq: Once | INTRAVENOUS | Status: AC | PRN
  Administered 2015-05-28: 33 via INTRAVENOUS

## 2015-05-29 ENCOUNTER — Telehealth: Payer: Self-pay | Admitting: Cardiovascular Disease

## 2015-05-29 NOTE — Telephone Encounter (Signed)
Pt called for stress test results a preliminary results given pt is aware that MD has not review results  at this time . We will call her back when is does. Pt verbalized understanding.

## 2015-05-29 NOTE — Telephone Encounter (Signed)
New message      Pt is calling to get stress test results.  She has surgery scheduled for Friday and is anxious to know the results

## 2015-05-29 NOTE — H&P (Signed)
Julie Day. [facility]  Location: [facility] Surgery Patient #: [id number] DOB: 1946/03/13 Married / Language: English / Race: White Female      History of Present Illness   The patient is a 69 year old female who presents with a complaint of hiatal hernia with obstructive symptoms. This is a 69 year old Caucasian female, former patient of mine who underwent laparoscopic cholecystectomy by me in 2001. She is referred by Dr. Earle Gell for evaluation and management of a hiatal hernia with obstructive symptoms. Dr. Lavone Orn is her PCP. Dr. Jenkins Rouge is her cardiologist.  The patient has had reflux symptoms for many years with heartburn. She's been taking proton pump inhibitors for a long time. Starting in March of this year she began having episodes of anterior substernal chest pain. This would occur randomly, usually during the middle of the day and would last into the late day. Very distressing to her. No vomiting but occasionally had a little bit of nausea. She says she swallows well 95% of the time but rarely she'll feel like the food sticks in her upper chest and lower neck. No pain when she swallows. Weight has been stable and asked that she's been gaining a little bit of weight.  She underwent cardiac evaluation stress testing by Dr. Johnsie Cancel recently which was normal. She underwent upper endoscopy by Dr. Wynetta Emery which shows an 11 cm hiatal hernia but otherwise the esophagus and gastric and duodenal mucosa looked fine. A barium swallow was performed which shows normal swallowing mechanism, some tertiary contractions in the esophagus but no stricture or mass. 13 mm barium tablet passed easily. There was an 11 cm. hiatal hernia. The radiologist did not characterize this further, only said that the fundus was up in the chest. It looks like the GE junction is in normal location and that this is a paraesophageal hernia. She had esophageal manometry which looks good.  Good relaxation of the lower esophageal sphincter. Good peristalsis propagation.  Comorbidities include tobacco abuse, although she says she is down to 1 or 2 cigarettes a day. She was urged to quit smoking completely. Chronic GERD. Hypertension. Status post laparoscopic cholecystectomy. Status post hysterectomy. Status post bladder suspension through Pfannenstiel incision, possibly with mesh. Multiple hepatic cyst. Small hemangioma in the right hepatic lobe.  I told the patient and her husband that I thought she was having obstructive symptoms from her hiatal hernia, possibly due to intermittent volvulus torsion. Offered to proceed with laparoscopic reduction and repair of her hiatal hernia and Nissen fundoplication. I explained the anatomy and pathophysiology of this situation with them. I discussed the indications, details, techniques, and numerous risk of the surgery. They're aware the risks of bleeding, infection, injury to the spleen or liver with bleeding, recurrence of the hernia, stricture requiring dilatation, recurrence of the hernia, pneumothorax, intestinal perforation with sepsis, conversion to open laparotomy. They understand all of these issues. At this time all of the questions were answered. She is very much in favor of proceeding with this surgery and this will be scheduled.   Other Problems  Arthritis Back Pain Bladder Problems Chest pain Cholelithiasis Gastroesophageal Reflux Disease Hemorrhoids High blood pressure Migraine Headache Oophorectomy Bilateral. Other disease, cancer, significant illness  Past Surgical History Gallbladder Surgery - Open Hysterectomy (not due to cancer) - Complete  Diagnostic Studies History  Colonoscopy 5-10 years ago Mammogram within last year  Allergies  Codeine Phosphate *ANALGESICS - OPIOID*  Medication History  Nitrostat (0.4MG  Tab Sublingual, Sublingual) Active. Oxybutynin  Chloride  ER (10MG  Tablet ER 24HR, Oral) Active. Pantoprazole Sodium (40MG  Tablet DR, Oral) Active. Triamterene-HCTZ (37.5-25MG  Tablet, Oral) Active. Medications Reconciled  Social History  Caffeine use Carbonated beverages, Coffee. No alcohol use No drug use Tobacco use Current some day smoker.  Family History  Alcohol Abuse Father. Arthritis Mother, Sister. Hypertension Mother, Sister. Migraine Headache Mother, Sister.  Pregnancy / Birth History  Age at menarche 65 years. Age of menopause [age] Contraceptive History Oral contraceptives. Gravida 3 Maternal age 59-25 Para 3    Review of Systems  General Not Present- Appetite Loss, Chills, Fatigue, Fever, Night Sweats, Weight Gain and Weight Loss. Skin Not Present- Change in Wart/Mole, Dryness, Hives, Jaundice, New Lesions, Non-Healing Wounds, Rash and Ulcer. HEENT Present- Ringing in the Ears, Seasonal Allergies and Wears glasses/contact lenses. Not Present- Earache, Hearing Loss, Hoarseness, Nose Bleed, Oral Ulcers, Sinus Pain, Sore Throat, Visual Disturbances and Yellow Eyes. Respiratory Not Present- Bloody sputum, Chronic Cough, Difficulty Breathing, Snoring and Wheezing. Breast Not Present- Breast Mass, Breast Pain, Nipple Discharge and Skin Changes. Cardiovascular Present- Chest Pain. Not Present- Difficulty Breathing Lying Down, Leg Cramps, Palpitations, Rapid Heart Rate, Shortness of Breath and Swelling of Extremities. Gastrointestinal Present- Constipation, Difficulty Swallowing, Excessive gas and Indigestion. Not Present- Abdominal Pain, Bloating, Bloody Stool, Change in Bowel Habits, Chronic diarrhea, Gets full quickly at meals, Hemorrhoids, Nausea, Rectal Pain and Vomiting. Female Genitourinary Present- Frequency and Nocturia. Not Present- Painful Urination, Pelvic Pain and Urgency. Musculoskeletal Present- Back Pain and Joint Pain. Not Present- Joint Stiffness, Muscle Pain, Muscle Weakness and Swelling  of Extremities. Neurological Present- Headaches. Not Present- Decreased Memory, Fainting, Numbness, Seizures, Tingling, Tremor, Trouble walking and Weakness. Psychiatric Not Present- Anxiety, Bipolar, Change in Sleep Pattern, Depression, Fearful and Frequent crying. Endocrine Present- Cold Intolerance and Hot flashes. Not Present- Excessive Hunger, Hair Changes, Heat Intolerance and New Diabetes. Hematology Present- Easy Bruising. Not Present- Excessive bleeding, Gland problems, HIV and Persistent Infections.  Vitals   Weight: 150 lb Height: 64in Body Surface Area: 1.75 m Body Mass Index: 25.75 kg/m  Pulse: 60 (Regular)  BP: 120/80 (Sitting, Left Arm, Standard)     Physical Exam  General Mental Status-Alert. General Appearance-Not in acute distress. Build & Nutrition-Well nourished. Posture-Normal posture. Gait-Normal. Note: Husband is with her throughout the encounter.   Head and Neck Head-normocephalic, atraumatic with no lesions or palpable masses. Trachea-midline. Thyroid Gland Characteristics - normal size and consistency and no palpable nodules.  Chest and Lung Exam Chest and lung exam reveals -on auscultation, normal breath sounds, no adventitious sounds and normal vocal resonance.  Cardiovascular Cardiovascular examination reveals -normal heart sounds, regular rate and rhythm with no murmurs and femoral artery auscultation bilaterally reveals normal pulses, no bruits, no thrills.  Abdomen Inspection Inspection of the abdomen reveals - No Hernias. Palpation/Percussion Palpation and Percussion of the abdomen reveal - Soft, Non Tender, No Rigidity (guarding), No hepatosplenomegaly and No Palpable abdominal masses. Note: Abdomen is soft and nontender. Multiple trocar sites. No mass or hernia. Well-healed Pfannenstiel incision.   Neurologic Neurologic evaluation reveals -alert and oriented x 3 with no impairment of recent or remote  memory, normal attention span and ability to concentrate, normal sensation and normal coordination.  Musculoskeletal Normal Exam - Bilateral-Upper Extremity Strength Normal and Lower Extremity Strength Normal.    Assessment & Plan  HIATAL HERNIA WITH GERD (530.81  K21.9) Current Plans You are being scheduled for surgery - Our schedulers will call you.  You have a medium-sized hiatal hernia, and the severe chest pain  that you're having sounds a lot like intermittent twisting and obstruction. 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 Dalbert Batman, M.D., [facility] General and Minimally invasive Surgery Breast and Colorectal Surgery Office:   [phone] Pager:   [phone]

## 2015-05-30 MED ORDER — CHLORHEXIDINE GLUCONATE 4 % EX LIQD: 1.0000 "application " | Freq: Once | CUTANEOUS | Status: DC

## 2015-05-30 MED ORDER — CEFAZOLIN SODIUM-DEXTROSE 2-3 GM-% IV SOLR
2.0000 g | INTRAVENOUS | Status: AC
  Administered 2015-05-31: 2 g via INTRAVENOUS
  Filled 2015-05-30: qty 50

## 2015-05-31 ENCOUNTER — Encounter (HOSPITAL_COMMUNITY): Payer: Self-pay | Admitting: *Deleted

## 2015-05-31 ENCOUNTER — Ambulatory Visit (HOSPITAL_COMMUNITY): Payer: Medicare HMO | Admitting: Anesthesiology

## 2015-05-31 ENCOUNTER — Encounter (HOSPITAL_COMMUNITY)
Admission: AD | Disposition: A | Discharge status: Home / Self Care | Payer: Self-pay | Source: Ambulatory Visit | Attending: General Surgery

## 2015-05-31 ENCOUNTER — Inpatient Hospital Stay (HOSPITAL_COMMUNITY)
Admission: AD | Admit: 2015-05-31 | Discharge: 2015-06-03 | DRG: 327 | Disposition: A | Discharge status: Home / Self Care | Payer: Medicare HMO | Source: Ambulatory Visit | Attending: General Surgery | Admitting: General Surgery

## 2015-05-31 ENCOUNTER — Ambulatory Visit (HOSPITAL_COMMUNITY): Payer: Medicare HMO | Admitting: Vascular Surgery

## 2015-05-31 DIAGNOSIS — D1803 Hemangioma of intra-abdominal structures: Secondary | ICD-10-CM | POA: Diagnosis present

## 2015-05-31 DIAGNOSIS — I10 Essential (primary) hypertension: Secondary | ICD-10-CM | POA: Diagnosis present

## 2015-05-31 DIAGNOSIS — K7689 Other specified diseases of liver: Secondary | ICD-10-CM | POA: Diagnosis present

## 2015-05-31 DIAGNOSIS — K219 Gastro-esophageal reflux disease without esophagitis: Secondary | ICD-10-CM | POA: Diagnosis not present

## 2015-05-31 DIAGNOSIS — K440 Diaphragmatic hernia with obstruction, without gangrene: Secondary | ICD-10-CM | POA: Diagnosis not present

## 2015-05-31 DIAGNOSIS — J9811 Atelectasis: Secondary | ICD-10-CM | POA: Diagnosis not present

## 2015-05-31 DIAGNOSIS — K449 Diaphragmatic hernia without obstruction or gangrene: Secondary | ICD-10-CM

## 2015-05-31 DIAGNOSIS — M25512 Pain in left shoulder: Secondary | ICD-10-CM | POA: Diagnosis not present

## 2015-05-31 DIAGNOSIS — R0989 Other specified symptoms and signs involving the circulatory and respiratory systems: Secondary | ICD-10-CM

## 2015-05-31 DIAGNOSIS — F17210 Nicotine dependence, cigarettes, uncomplicated: Secondary | ICD-10-CM | POA: Diagnosis present

## 2015-05-31 DIAGNOSIS — Z90710 Acquired absence of both cervix and uterus: Secondary | ICD-10-CM

## 2015-05-31 DIAGNOSIS — E876 Hypokalemia: Secondary | ICD-10-CM | POA: Diagnosis not present

## 2015-05-31 HISTORY — PX: HIATAL HERNIA REPAIR: SHX195

## 2015-05-31 HISTORY — DX: Personal history of other diseases of the digestive system: Z87.19

## 2015-05-31 HISTORY — PX: COLON RESECTION: SHX5231

## 2015-05-31 LAB — CBC
HEMATOCRIT: 37.1 % (ref 36.0–46.0)
Hemoglobin: 12.4 g/dL (ref 12.0–15.0)
MCH: 30 pg (ref 26.0–34.0)
MCHC: 33.4 g/dL (ref 30.0–36.0)
MCV: 89.8 fL (ref 78.0–100.0)
Platelets: 207 10*3/uL (ref 150–400)
RBC: 4.13 MIL/uL (ref 3.87–5.11)
RDW: 13.7 % (ref 11.5–15.5)
WBC: 14.1 10*3/uL — ABNORMAL HIGH (ref 4.0–10.5)

## 2015-05-31 LAB — CREATININE, SERUM
Creatinine, Ser: 0.87 mg/dL (ref 0.44–1.00)
GFR calc non Af Amer: >60 mL/min (ref >60)

## 2015-05-31 SURGERY — COLON RESECTION LAPAROSCOPIC
Anesthesia: General | Site: Abdomen

## 2015-05-31 MED ORDER — 0.9 % SODIUM CHLORIDE (POUR BTL) OPTIME
TOPICAL | Status: DC | PRN
  Administered 2015-05-31 (×2): 1000 mL

## 2015-05-31 MED ORDER — ROCURONIUM BROMIDE 100 MG/10ML IV SOLN
INTRAVENOUS | Status: DC | PRN
  Administered 2015-05-31: 20 mg via INTRAVENOUS
  Administered 2015-05-31: 40 mg via INTRAVENOUS
  Administered 2015-05-31: 10 mg via INTRAVENOUS
  Administered 2015-05-31: 30 mg via INTRAVENOUS

## 2015-05-31 MED ORDER — HYDROMORPHONE HCL 1 MG/ML IJ SOLN
1.0000 mg | INTRAMUSCULAR | Status: DC | PRN
  Administered 2015-05-31 – 2015-06-01 (×6): 1 mg via INTRAVENOUS
  Filled 2015-05-31 (×6): qty 1

## 2015-05-31 MED ORDER — GLYCOPYRROLATE 0.2 MG/ML IJ SOLN
INTRAMUSCULAR | Status: AC
  Filled 2015-05-31: qty 3

## 2015-05-31 MED ORDER — FENTANYL CITRATE (PF) 250 MCG/5ML IJ SOLN
INTRAMUSCULAR | Status: AC
  Filled 2015-05-31: qty 5

## 2015-05-31 MED ORDER — ONDANSETRON HCL 4 MG/2ML IJ SOLN
INTRAMUSCULAR | Status: AC
  Filled 2015-05-31: qty 2

## 2015-05-31 MED ORDER — SUGAMMADEX SODIUM 200 MG/2ML IV SOLN
INTRAVENOUS | Status: AC
  Filled 2015-05-31: qty 2

## 2015-05-31 MED ORDER — FENTANYL CITRATE (PF) 100 MCG/2ML IJ SOLN
INTRAMUSCULAR | Status: AC
  Administered 2015-05-31: 25 ug via INTRAVENOUS
  Filled 2015-05-31: qty 2

## 2015-05-31 MED ORDER — SODIUM CHLORIDE 0.9 % IV SOLN
8.0000 mg | Freq: Four times a day (QID) | INTRAVENOUS | Status: DC | PRN
  Administered 2015-06-01: 8 mg via INTRAVENOUS
  Filled 2015-05-31 (×2): qty 4

## 2015-05-31 MED ORDER — NITROGLYCERIN 0.4 MG SL SUBL: 0.4000 mg | SUBLINGUAL_TABLET | SUBLINGUAL | Status: DC | PRN

## 2015-05-31 MED ORDER — EPHEDRINE SULFATE 50 MG/ML IJ SOLN
INTRAMUSCULAR | Status: DC | PRN
  Administered 2015-05-31: 5 mg via INTRAVENOUS
  Administered 2015-05-31: 10 mg via INTRAVENOUS
  Administered 2015-05-31 (×3): 5 mg via INTRAVENOUS

## 2015-05-31 MED ORDER — LACTATED RINGERS IV SOLN
INTRAVENOUS | Status: DC
  Administered 2015-05-31 (×4): via INTRAVENOUS

## 2015-05-31 MED ORDER — ONDANSETRON HCL 4 MG/2ML IJ SOLN: 4.0000 mg | Freq: Once | INTRAMUSCULAR | Status: DC | PRN

## 2015-05-31 MED ORDER — EPHEDRINE SULFATE 50 MG/ML IJ SOLN
INTRAMUSCULAR | Status: AC
  Filled 2015-05-31: qty 1

## 2015-05-31 MED ORDER — PANTOPRAZOLE SODIUM 40 MG IV SOLR
40.0000 mg | Freq: Two times a day (BID) | INTRAVENOUS | Status: DC
  Administered 2015-05-31 – 2015-06-01 (×3): 40 mg via INTRAVENOUS
  Filled 2015-05-31 (×4): qty 40

## 2015-05-31 MED ORDER — DEXTROSE-NACL 5-0.45 % IV SOLN
INTRAVENOUS | Status: DC
  Administered 2015-05-31: 19:00:00 via INTRAVENOUS

## 2015-05-31 MED ORDER — MIDAZOLAM HCL 5 MG/5ML IJ SOLN
INTRAMUSCULAR | Status: DC | PRN
  Administered 2015-05-31: 2 mg via INTRAVENOUS

## 2015-05-31 MED ORDER — NEOSTIGMINE METHYLSULFATE 10 MG/10ML IV SOLN
INTRAVENOUS | Status: DC | PRN
  Administered 2015-05-31: 4 mg via INTRAVENOUS

## 2015-05-31 MED ORDER — ROCURONIUM BROMIDE 50 MG/5ML IV SOLN
INTRAVENOUS | Status: AC
  Filled 2015-05-31: qty 1

## 2015-05-31 MED ORDER — SODIUM CHLORIDE 0.9 % IR SOLN
Status: DC | PRN
  Administered 2015-05-31: 1000 mL

## 2015-05-31 MED ORDER — ENOXAPARIN SODIUM 40 MG/0.4ML ~~LOC~~ SOLN
40.0000 mg | SUBCUTANEOUS | Status: DC
  Administered 2015-06-01 – 2015-06-02 (×2): 40 mg via SUBCUTANEOUS
  Filled 2015-05-31 (×2): qty 0.4

## 2015-05-31 MED ORDER — CETYLPYRIDINIUM CHLORIDE 0.05 % MT LIQD
7.0000 mL | Freq: Two times a day (BID) | OROMUCOSAL | Status: DC
  Administered 2015-05-31 – 2015-06-02 (×3): 7 mL via OROMUCOSAL

## 2015-05-31 MED ORDER — BUPIVACAINE-EPINEPHRINE (PF) 0.5% -1:200000 IJ SOLN
INTRAMUSCULAR | Status: AC
  Filled 2015-05-31: qty 30

## 2015-05-31 MED ORDER — LIDOCAINE HCL (CARDIAC) 20 MG/ML IV SOLN
INTRAVENOUS | Status: AC
  Filled 2015-05-31: qty 5

## 2015-05-31 MED ORDER — CHLORHEXIDINE GLUCONATE 0.12 % MT SOLN
15.0000 mL | Freq: Two times a day (BID) | OROMUCOSAL | Status: DC
  Administered 2015-05-31 – 2015-06-02 (×5): 15 mL via OROMUCOSAL
  Filled 2015-05-31 (×5): qty 15

## 2015-05-31 MED ORDER — NICOTINE 14 MG/24HR TD PT24
14.0000 mg | MEDICATED_PATCH | Freq: Every day | TRANSDERMAL | Status: DC
  Administered 2015-06-01 – 2015-06-02 (×2): 14 mg via TRANSDERMAL
  Filled 2015-05-31 (×3): qty 1

## 2015-05-31 MED ORDER — FENTANYL CITRATE (PF) 100 MCG/2ML IJ SOLN
INTRAMUSCULAR | Status: AC
  Filled 2015-05-31: qty 2

## 2015-05-31 MED ORDER — BUPIVACAINE-EPINEPHRINE 0.5% -1:200000 IJ SOLN
INTRAMUSCULAR | Status: DC | PRN
  Administered 2015-05-31: 8 mL

## 2015-05-31 MED ORDER — PROPOFOL 10 MG/ML IV BOLUS
INTRAVENOUS | Status: AC
  Filled 2015-05-31: qty 20

## 2015-05-31 MED ORDER — ONDANSETRON HCL 4 MG/2ML IJ SOLN
INTRAMUSCULAR | Status: DC | PRN
  Administered 2015-05-31: 4 mg via INTRAVENOUS

## 2015-05-31 MED ORDER — PHENYLEPHRINE HCL 10 MG/ML IJ SOLN
INTRAMUSCULAR | Status: DC | PRN
  Administered 2015-05-31 (×2): 40 ug via INTRAVENOUS

## 2015-05-31 MED ORDER — FENTANYL CITRATE (PF) 100 MCG/2ML IJ SOLN
25.0000 ug | INTRAMUSCULAR | Status: AC | PRN
  Administered 2015-05-31 (×6): 25 ug via INTRAVENOUS

## 2015-05-31 MED ORDER — GLYCOPYRROLATE 0.2 MG/ML IJ SOLN
INTRAMUSCULAR | Status: DC | PRN
  Administered 2015-05-31: 0.6 mg via INTRAVENOUS

## 2015-05-31 MED ORDER — MIDAZOLAM HCL 2 MG/2ML IJ SOLN
INTRAMUSCULAR | Status: AC
  Filled 2015-05-31: qty 4

## 2015-05-31 MED ORDER — PROPOFOL 10 MG/ML IV BOLUS
INTRAVENOUS | Status: DC | PRN
  Administered 2015-05-31: 150 mg via INTRAVENOUS

## 2015-05-31 MED ORDER — FENTANYL CITRATE (PF) 100 MCG/2ML IJ SOLN
INTRAMUSCULAR | Status: DC | PRN
  Administered 2015-05-31: 100 ug via INTRAVENOUS
  Administered 2015-05-31: 50 ug via INTRAVENOUS
  Administered 2015-05-31: 100 ug via INTRAVENOUS
  Administered 2015-05-31: 50 ug via INTRAVENOUS

## 2015-05-31 MED ORDER — SUGAMMADEX SODIUM 200 MG/2ML IV SOLN
INTRAVENOUS | Status: DC | PRN
  Administered 2015-05-31: 140 mg via INTRAVENOUS

## 2015-05-31 MED ORDER — ONDANSETRON 4 MG PO TBDP
4.0000 mg | ORAL_TABLET | Freq: Four times a day (QID) | ORAL | Status: DC | PRN
  Filled 2015-05-31: qty 1

## 2015-05-31 SURGICAL SUPPLY — 53 items
APPLIER CLIP ROT 10 11.4 M/L (STAPLE) ×2
ATTRACTOMAT 16X20 MAGNETIC DRP (DRAPES) ×2 IMPLANT
CANISTER SUCTION 2500CC (MISCELLANEOUS) ×2 IMPLANT
CHLORAPREP W/TINT 26ML (MISCELLANEOUS) ×2 IMPLANT
CLIP APPLIE ROT 10 11.4 M/L (STAPLE) ×1 IMPLANT
COVER MAYO STAND STRL (DRAPES) ×2 IMPLANT
COVER SURGICAL LIGHT HANDLE (MISCELLANEOUS) ×2 IMPLANT
DERMABOND ADVANCED (GAUZE/BANDAGES/DRESSINGS) ×1
DERMABOND ADVANCED .7 DNX12 (GAUZE/BANDAGES/DRESSINGS) ×1 IMPLANT
DEVICE SUT QUICK LOAD TK 5 (STAPLE) ×16 IMPLANT
DEVICE SUT TI-KNOT TK 5X26 (MISCELLANEOUS) ×2 IMPLANT
DEVICE SUTURE ENDOST 10MM (ENDOMECHANICALS) ×2 IMPLANT
DISSECTOR BLUNT TIP ENDO 5MM (MISCELLANEOUS) ×2 IMPLANT
DRAPE WARM FLUID 44X44 (DRAPE) ×2 IMPLANT
ELECT REM PT RETURN 9FT ADLT (ELECTROSURGICAL) ×2
ELECTRODE REM PT RTRN 9FT ADLT (ELECTROSURGICAL) ×1 IMPLANT
FILTER SMOKE EVAC LAPAROSHD (FILTER) ×2 IMPLANT
GAUZE SPONGE 4X4 16PLY XRAY LF (GAUZE/BANDAGES/DRESSINGS) ×2 IMPLANT
GEL ULTRASOUND 20GR AQUASONIC (MISCELLANEOUS) ×4 IMPLANT
GLOVE BIO SURGEON STRL SZ 6.5 (GLOVE) ×4 IMPLANT
GLOVE BIO SURGEON STRL SZ7 (GLOVE) ×2 IMPLANT
GLOVE BIO SURGEON STRL SZ7.5 (GLOVE) ×6 IMPLANT
GLOVE BIOGEL PI IND STRL 7.0 (GLOVE) ×5 IMPLANT
GLOVE BIOGEL PI IND STRL 7.5 (GLOVE) ×3 IMPLANT
GLOVE BIOGEL PI INDICATOR 7.0 (GLOVE) ×5
GLOVE BIOGEL PI INDICATOR 7.5 (GLOVE) ×3
GLOVE EUDERMIC 7 POWDERFREE (GLOVE) ×2 IMPLANT
GOWN STRL REUS W/ TWL LRG LVL3 (GOWN DISPOSABLE) ×5 IMPLANT
GOWN STRL REUS W/ TWL XL LVL3 (GOWN DISPOSABLE) ×2 IMPLANT
GOWN STRL REUS W/TWL LRG LVL3 (GOWN DISPOSABLE) ×5
GOWN STRL REUS W/TWL XL LVL3 (GOWN DISPOSABLE) ×2
KIT BASIN OR (CUSTOM PROCEDURE TRAY) ×2 IMPLANT
KIT ROOM TURNOVER OR (KITS) ×2 IMPLANT
NEEDLE HYPO 25GX1X1/2 BEV (NEEDLE) ×2 IMPLANT
NS IRRIG 1000ML POUR BTL (IV SOLUTION) ×4 IMPLANT
PACK GENERAL/GYN (CUSTOM PROCEDURE TRAY) ×2 IMPLANT
PAD ARMBOARD 7.5X6 YLW CONV (MISCELLANEOUS) ×4 IMPLANT
RELOAD ENDO STITCH (ENDOMECHANICALS) ×2 IMPLANT
SCALPEL HARMONIC ACE (MISCELLANEOUS) ×2 IMPLANT
SCISSORS LAP 5X35 DISP (ENDOMECHANICALS) ×2 IMPLANT
SLEEVE ENDOPATH XCEL 5M (ENDOMECHANICALS) ×6 IMPLANT
SOLUTION ANTI FOG 6CC (MISCELLANEOUS) ×2 IMPLANT
SURGILUBE 2OZ TUBE FLIPTOP (MISCELLANEOUS) IMPLANT
SUT MNCRL AB 4-0 PS2 18 (SUTURE) ×2 IMPLANT
SUT SURGIDAC NAB ES-9 0 48 120 (SUTURE) ×18 IMPLANT
SYR CONTROL 10ML LL (SYRINGE) ×2 IMPLANT
TIP INNERVISION DETACH 56FR (MISCELLANEOUS) ×2 IMPLANT
TOWEL OR 17X26 10 PK STRL BLUE (TOWEL DISPOSABLE) ×2 IMPLANT
TRAY FOLEY CATH 16FRSI W/METER (SET/KITS/TRAYS/PACK) ×2 IMPLANT
TRAY LAPAROSCOPIC MC (CUSTOM PROCEDURE TRAY) IMPLANT
TROCAR XCEL NON-BLD 11X100MML (ENDOMECHANICALS) ×4 IMPLANT
TROCAR XCEL NON-BLD 5MMX100MML (ENDOMECHANICALS) ×2 IMPLANT
TUBING FILTER THERMOFLATOR (ELECTROSURGICAL) ×2 IMPLANT

## 2015-05-31 NOTE — Anesthesia Postprocedure Evaluation (Signed)
  Anesthesia Post-op Note  Patient: Julie Day  Procedure(s) Performed: Procedure(s): LAPAROSCOPIC REPAIR OF HIATAL HERNIA  AND LAPAROSCOPIC NISSEN FUNDOPLICATION (N/A)  Patient Location: PACU  Anesthesia Type:General  Level of Consciousness: awake, alert , oriented and patient cooperative  Airway and Oxygen Therapy: Patient Spontanous Breathing and Patient connected to nasal cannula oxygen  Post-op Pain: mild  Post-op Assessment: Post-op Vital signs reviewed, Patient's Cardiovascular Status Stable, Respiratory Function Stable, Patent Airway, No signs of Nausea or vomiting and Pain level controlled              Post-op Vital Signs: Reviewed and stable  Last Vitals:  Filed Vitals:   05/31/15 1522  BP:   Pulse:   Temp: 36.1 C  Resp:     Complications: No apparent anesthesia complications

## 2015-05-31 NOTE — Op Note (Addendum)
Patient Name:           Julie Day   Date of Surgery:        05/31/2015  Pre op Diagnosis:      Para esophageal hernia  Post op Diagnosis:    Same  Procedure:                 Laparoscopic reduction and repair of paraesophageal hernia, Nissen fundoplication  Surgeon:                     Edsel Petrin. Dalbert Batman, M.D., FACS  Assistant:                      Jackolyn Confer, M.D., FACS  Operative Indications:  . This is a 69 year old Caucasian female, former patient of mine who underwent laparoscopic cholecystectomy by me in 2001. She is referred by Dr. Earle Gell for evaluation and management of a hiatal hernia with obstructive symptoms. Dr. Lavone Orn is her PCP. Dr. Jenkins Rouge is her cardiologist. The patient has had reflux symptoms for many years with heartburn. She's been taking proton pump inhibitors for a long time. Starting in March of this year she began having episodes of anterior substernal chest pain. This would occur randomly, usually during the middle of the day and would last into the late day. Very distressing to her. No vomiting but occasionally had a little bit of nausea. She says she swallows well 95% of the time but rarely she'll feel like the food sticks in her upper chest and lower neck. No pain when she swallows. Weight has been stable and asked that she's been gaining a little bit of weight. She underwent cardiac evaluation stress testing by Dr. Johnsie Cancel recently which was normal. She underwent upper endoscopy by Dr. Wynetta Emery which shows an 11 cm hiatal hernia but otherwise the esophagus and gastric and duodenal mucosa looked fine. A barium swallow was performed which shows normal swallowing mechanism, some tertiary contractions in the esophagus but no stricture or mass. 13 mm barium tablet passed easily. There was an 11 cm. hiatal hernia. The radiologist did not characterize this further, only said that the fundus was up in the chest. It looks like the GE  junction is in normal location and that this is a paraesophageal hernia. She had esophageal manometry which looks good. Good relaxation of the lower esophageal sphincter. Good peristalsis propagation. Comorbidities include tobacco abuse, although she says she is down to 1 or 2 cigarettes a day. She was urged to quit smoking completely. Chronic GERD. Hypertension. Status post laparoscopic cholecystectomy. Status post hysterectomy. Status post bladder suspension through Pfannenstiel incision, possibly with mesh.  I told the patient and her husband that I thought she was having obstructive symptoms from her hiatal hernia, possibly due to intermittent volvulus torsion. Offered to proceed with laparoscopic reduction and repair of her hiatal hernia and Nissen fundoplication. I explained the anatomy and pathophysiology of this situation with them. I discussed the indications, details, techniques, and numerous risk of the surgery. They're aware the risks of bleeding, infection, injury to the spleen or liver with bleeding, recurrence of the hernia, stricture requiring dilatation, recurrence of the hernia, pneumothorax, intestinal perforation with sepsis, conversion to open laparotomy. They understand all of these issues. At this time all of the questions were answered. She is very much in favor of proceeding with this surgery and this will be scheduled.  Operative Findings:  We were able to strip the sac completely out of the mediastinum, closed the diaphragm with pledgeted 0-ethibond sutures over a 50 mm lighted dilator, and perform a Nissen fundoplication with 3 sutures, creating a loose wrap.  The surgery was tedious, slow and technically difficult because exposure was difficult due to a very heavy stiff left lobe of the liver.  The left triangular ligament was actually herniated up into the chest and had to be taken down somewhat.  There were no apparent  intraoperative  complications.  Procedure in Detail:      Following the induction of general endotracheal anesthesia a Foley catheter was placed.  The abdomen was prepped and draped in a sterile fashion.  Intravenous antibodies were given.  Surgical timeout was performed.  0.5% Marcaine with epinephrine was used as local infiltration anesthetic.      A 5 mm optical trocar was placed in the left subcostal region.  This was uneventful.  There was no injury.  Pneumoperitoneum was created uneventfully.  Another 5 mm trocar was placed in the left upper quadrant.  I placed  two11 mm trochars in the upper abdomen one to the right of the midline and one to the left of midline.  5 mm trochars placed in the right upper quadrant.  I placed a 5 mm trocar in the subxiphoid region and then replaced this with a large Technical brewer.  I used this to retract the left lobe of the liver.  I found that the left lobe of the liver was large and very stiff and difficult to retract.  I found that the left triangular ligament and left lobe of the liver was actually herniated up into the anterior part of the hiatus making dissection and exposure much more difficult.      The difficult exposure was managed by slowing down the dissection into smaller steps.  We divided the gastrohepatic ligament with the Harmonic scalpel.  We identified the right crus and dissected some of the hernia sac off of the right crus and continued the dissection of the hernia sac up anteriorly and then across to the left side.  We slowly dissected the hernia sac down from superior to inferior.  I dissected out the left crus.  We created a window behind the esophagus once we had completely dissected the crura and we passed a Penrose drain behind the GE junction.  This was very helpful and allowed [place] to complete the dissection and fully mobilize the esophagus and GE junction below the crura.  We identified and preserved the posterior vagus nerve.  We pulled the hernia sac well  down.  We divided all of the short gastric vessels.  We closed the diaphragm with 3 pledgeted sutures of 0 Ethibond over a 50 mm lighted dilator, which had been placed by DR. Jillyn Hidden.   We then passed a suitable piece of fundus behind the esophagus and then found a suitable piece of fundus on the left of the esophagus.  We checked to make sure these were contiguous and we could do a shoeshine maneuver.  We then created a loose fundoplication over the 50 mm dilator with 3 interrupted sutures of 0 Ethibond.  The sutures were placed in the fundus on the left, anterior wall of the esophagus, and an fundus on the right. All of the sutures were tied with a ti-knot device. This was actually a very loose wrap over the dilator and looked good.  The dilator was then removed.  We  did not have good enough exposure to suture the fundoplication to the anterior diaphragmatic crura and I felt that this was going to torque the fundoplication and so we chose not to do this.  I felt that the diaphragmatic closure was fairly tight anyway.  We did not feel tha mesh was indicated.We did a 4 quadrant examination and found no bleeding or injury.  We reassessed the fundoplication of the diaphragmatic closure and felt that everything looked good.  The pneumoperitoneum was released.  The trocars were removed.  The Nathanson retractor was also removed.  Skin incisions were closed with subcuticular sutures of 4-0 Monocryl and Dermabond.  The patient tolerated the procedure well and was taken to PACU in stable condition.  EBL 25 cc.  Counts correct.  Complications none.     Edsel Petrin. Dalbert Batman, M.D., FACS General and Minimally Invasive Surgery Breast and Colorectal Surgery  05/31/2015 1:07 PM

## 2015-05-31 NOTE — Progress Notes (Signed)
Pt a/o, c/o pain but did not want any pain meds at the time, pts BP at 2100 was 100/58, pt stated she was "a little lightheaded" pt on D5 1/2 NS @ 100 ml/hr, urine output adequate for shift, rechecked BP at 2159 was 102/58, pt stated she did not feel lightheaded, pt stable, resting comfortably in bed

## 2015-05-31 NOTE — Interval H&P Note (Signed)
History and Physical Interval Note:  57/90/3833 3:83 AM  Julie Day  has presented today for surgery, with the diagnosis of Hiatal Hernia  The various methods of treatment have been discussed with the patient and family. After consideration of risks, benefits and other options for treatment, the patient has consented to  Procedure(s): Diaz (N/A) as a surgical intervention .  The patient's history has been reviewed, patient examined, no change in status, stable for surgery.  I have reviewed the patient's chart and labs.  Questions were answered to the patient's satisfaction.     Adin Hector

## 2015-05-31 NOTE — Anesthesia Procedure Notes (Signed)
Procedure Name: Intubation Date/Time: 05/31/2015 9:42 AM Performed by: Laretta Alstrom Pre-anesthesia Checklist: Patient identified, Timeout performed, Emergency Drugs available, Suction available and Patient being monitored Patient Re-evaluated:Patient Re-evaluated prior to inductionOxygen Delivery Method: Circle system utilized Preoxygenation: Pre-oxygenation with 100% oxygen Intubation Type: IV induction Ventilation: Mask ventilation without difficulty Laryngoscope Size: Mac and 3 Grade View: Grade III Tube type: Oral Tube size: 7.0 mm Number of attempts: 1 Placement Confirmation: ETT inserted through vocal cords under direct vision Secured at: 22 cm Dental Injury: Teeth and Oropharynx as per pre-operative assessment

## 2015-05-31 NOTE — Anesthesia Preprocedure Evaluation (Addendum)
Anesthesia Evaluation  Patient identified by MRN, date of birth, ID band Patient awake    Reviewed: Allergy & Precautions, NPO status , Patient's Chart, lab work & pertinent test results  History of Anesthesia Complications Negative for: history of anesthetic complications  Airway Mallampati: II  TM Distance: >3 FB Neck ROM: Full    Dental no notable dental hx. (+) Dental Advisory Given   Pulmonary former smoker,    Pulmonary exam normal breath sounds clear to auscultation       Cardiovascular hypertension, Pt. on medications Normal cardiovascular exam Rhythm:Regular Rate:Normal     Neuro/Psych negative neurological ROS  negative psych ROS   GI/Hepatic Neg liver ROS, GERD  Medicated and Controlled,  Endo/Other  negative endocrine ROS  Renal/GU negative Renal ROS  negative genitourinary   Musculoskeletal  (+) Arthritis ,   Abdominal   Peds negative pediatric ROS (+)  Hematology negative hematology ROS (+)   Anesthesia Other Findings   Reproductive/Obstetrics negative OB ROS                            Anesthesia Physical Anesthesia Plan  ASA: II  Anesthesia Plan: General   Post-op Pain Management:    Induction: Intravenous  Airway Management Planned: Oral ETT  Additional Equipment:   Intra-op Plan:   Post-operative Plan: Extubation in OR  Informed Consent: I have reviewed the patients History and Physical, chart, labs and discussed the procedure including the risks, benefits and alternatives for the proposed anesthesia with the patient or authorized representative who has indicated his/her understanding and acceptance.   Dental advisory given  Plan Discussed with: CRNA  Anesthesia Plan Comments:         Anesthesia Quick Evaluation

## 2015-05-31 NOTE — Transfer of Care (Signed)
Immediate Anesthesia Transfer of Care Note  Patient: Julie Day  Procedure(s) Performed: Procedure(s): LAPAROSCOPIC REPAIR OF HIATAL HERNIA  AND LAPAROSCOPIC NISSEN FUNDOPLICATION (N/A)  Patient Location: PACU  Anesthesia Type:General  Level of Consciousness: awake, alert  and oriented  Airway & Oxygen Therapy: Patient Spontanous Breathing and Patient connected to nasal cannula oxygen  Post-op Assessment: Report given to RN and Post -op Vital signs reviewed and stable  Post vital signs: Reviewed and stable  Last Vitals:  Filed Vitals:   05/31/15 1404  BP:   Pulse:   Temp: 36.5 C  Resp:     Complications: No apparent anesthesia complications

## 2015-06-01 ENCOUNTER — Observation Stay (HOSPITAL_COMMUNITY): Payer: Medicare HMO

## 2015-06-01 DIAGNOSIS — Z9889 Other specified postprocedural states: Secondary | ICD-10-CM | POA: Diagnosis not present

## 2015-06-01 LAB — CBC
HCT: 35.7 % — ABNORMAL LOW (ref 36.0–46.0)
HEMOGLOBIN: 11.8 g/dL — AB (ref 12.0–15.0)
MCH: 30.6 pg (ref 26.0–34.0)
MCHC: 33.1 g/dL (ref 30.0–36.0)
MCV: 92.5 fL (ref 78.0–100.0)
PLATELETS: 184 10*3/uL (ref 150–400)
RBC: 3.86 MIL/uL — AB (ref 3.87–5.11)
RDW: 14.1 % (ref 11.5–15.5)
WBC: 10.2 10*3/uL (ref 4.0–10.5)

## 2015-06-01 LAB — BASIC METABOLIC PANEL
ANION GAP: 5 (ref 5–15)
BUN: 8 mg/dL (ref 6–20)
CALCIUM: 8.2 mg/dL — AB (ref 8.9–10.3)
CO2: 29 mmol/L (ref 22–32)
CREATININE: 0.74 mg/dL (ref 0.44–1.00)
Chloride: 102 mmol/L (ref 101–111)
Glucose, Bld: 111 mg/dL — ABNORMAL HIGH (ref 65–99)
Potassium: 2.8 mmol/L — ABNORMAL LOW (ref 3.5–5.1)
SODIUM: 136 mmol/L (ref 135–145)

## 2015-06-01 MED ORDER — SODIUM CHLORIDE 0.9 % IV SOLN
INTRAVENOUS | Status: DC
  Administered 2015-06-01 – 2015-06-02 (×4): via INTRAVENOUS
  Filled 2015-06-01 (×7): qty 1000

## 2015-06-01 MED ORDER — SODIUM CHLORIDE 0.9 % IV SOLN
8.0000 mg | Freq: Four times a day (QID) | INTRAVENOUS | Status: DC | PRN
  Administered 2015-06-02: 8 mg via INTRAVENOUS
  Filled 2015-06-01 (×4): qty 4

## 2015-06-01 MED ORDER — POTASSIUM CHLORIDE 10 MEQ/100ML IV SOLN
10.0000 meq | INTRAVENOUS | Status: AC
  Administered 2015-06-01 (×4): 10 meq via INTRAVENOUS
  Filled 2015-06-01: qty 100

## 2015-06-01 MED ORDER — ONDANSETRON HCL 4 MG/2ML IJ SOLN
INTRAMUSCULAR | Status: AC
  Filled 2015-06-01: qty 4

## 2015-06-01 MED ORDER — ONDANSETRON 4 MG PO TBDP
8.0000 mg | ORAL_TABLET | Freq: Four times a day (QID) | ORAL | Status: DC | PRN
  Administered 2015-06-02: 8 mg via ORAL
  Filled 2015-06-01: qty 2

## 2015-06-01 MED ORDER — HYDROMORPHONE HCL 1 MG/ML IJ SOLN
0.5000 mg | INTRAMUSCULAR | Status: DC | PRN
  Administered 2015-06-01 – 2015-06-02 (×4): 0.5 mg via INTRAVENOUS
  Filled 2015-06-01 (×4): qty 1

## 2015-06-01 NOTE — Progress Notes (Addendum)
1 Day Post-Op  Subjective: Stable and alert.  No nausea.  Coughing some.  Pain control good.  States she wants to get up and walk in the hall. Afebrile.  Heart rate 77.  SPO2 96%. Hemoglobin 12.4.  WBC 14,100. K 2.8 Objective: Vital signs in last 24 hours: Temp:  [97 F (36.1 C)-98.8 F (37.1 C)] 98.8 F (37.1 C) (10/29 0520) Pulse Rate:  [63-100] 77 (10/29 0520) Resp:  [9-21] 17 (10/29 0520) BP: (99-156)/(50-98) 100/52 mmHg (10/29 0520) SpO2:  [90 %-100 %] 96 % (10/29 0520) Weight:  [68.04 kg (150 lb)-77.111 kg (170 lb)] 77.111 kg (170 lb) (10/28 2100) Last BM Date: 05/30/15  Intake/Output from previous day: 10/28 0701 - 10/29 0700 In: 2500 [I.V.:2500] Out: 650 [Urine:650] Intake/Output this shift: Total I/O In: 0  Out: 250 [Urine:250]    EXAM: General appearance: Alert.  Pleasant.  Cooperative.  Doesn't appear to be in any significant distress other than mild incisional pain. Resp: clear to auscultation bilaterally GI: Soft.  Not distended.  Wounds look clean.  Hypoactive bowel sounds.  Appropriate tenderness.  Lab Results:  Results for orders placed or performed during the hospital encounter of 05/31/15 (from the past 24 hour(s))  CBC     Status: Abnormal   Collection Time: 05/31/15  4:05 PM  Result Value Ref Range   WBC 14.1 (H) 4.0 - 10.5 K/uL   RBC 4.13 3.87 - 5.11 MIL/uL   Hemoglobin 12.4 12.0 - 15.0 g/dL   HCT 37.1 36.0 - 46.0 %   MCV 89.8 78.0 - 100.0 fL   MCH 30.0 26.0 - 34.0 pg   MCHC 33.4 30.0 - 36.0 g/dL   RDW 13.7 11.5 - 15.5 %   Platelets 207 150 - 400 K/uL  Creatinine, serum     Status: None   Collection Time: 05/31/15  4:05 PM  Result Value Ref Range   Creatinine, Ser 0.87 0.44 - 1.00 mg/dL   GFR calc non Af Amer >60 >60 mL/min   GFR calc Af Amer >60 >60 mL/min     Studies/Results: No results found.  Marland Kitchen antiseptic oral rinse  7 mL Mouth Rinse q12n4p  . chlorhexidine  15 mL Mouth Rinse BID  . enoxaparin (LOVENOX) injection  40 mg  Subcutaneous Q24H  . nicotine  14 mg Transdermal Daily  . pantoprazole (PROTONIX) IV  40 mg Intravenous Q12H     Assessment/Plan: s/p Procedure(s): LAPAROSCOPIC REPAIR OF HIATAL HERNIA  AND LAPAROSCOPIC NISSEN FUNDOPLICATION  POD #1.  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  If this looks good begin clear liquid diet.  Hypokalemia-rotate replace potassium in IV.  B-met tomorrow.  Tobacco abuse.  Declines nicotine patch.  Encouraged stop smoking. Essential hypertension Cavernous hemangioma of liver History laparoscopic cholecystectomy History of bladder suspension procedure with mesh    '@PROBHOSP' @     Julie Day 06/01/2015  . .prob

## 2015-06-02 ENCOUNTER — Inpatient Hospital Stay (HOSPITAL_COMMUNITY): Payer: Medicare HMO

## 2015-06-02 DIAGNOSIS — R69 Illness, unspecified: Secondary | ICD-10-CM | POA: Diagnosis not present

## 2015-06-02 DIAGNOSIS — E876 Hypokalemia: Secondary | ICD-10-CM | POA: Diagnosis not present

## 2015-06-02 DIAGNOSIS — J9811 Atelectasis: Secondary | ICD-10-CM | POA: Diagnosis not present

## 2015-06-02 DIAGNOSIS — I10 Essential (primary) hypertension: Secondary | ICD-10-CM | POA: Diagnosis not present

## 2015-06-02 DIAGNOSIS — D1803 Hemangioma of intra-abdominal structures: Secondary | ICD-10-CM | POA: Diagnosis not present

## 2015-06-02 DIAGNOSIS — J811 Chronic pulmonary edema: Secondary | ICD-10-CM | POA: Diagnosis not present

## 2015-06-02 DIAGNOSIS — K440 Diaphragmatic hernia with obstruction, without gangrene: Secondary | ICD-10-CM | POA: Diagnosis not present

## 2015-06-02 DIAGNOSIS — Z90710 Acquired absence of both cervix and uterus: Secondary | ICD-10-CM | POA: Diagnosis not present

## 2015-06-02 DIAGNOSIS — M25512 Pain in left shoulder: Secondary | ICD-10-CM | POA: Diagnosis not present

## 2015-06-02 DIAGNOSIS — K7689 Other specified diseases of liver: Secondary | ICD-10-CM | POA: Diagnosis not present

## 2015-06-02 DIAGNOSIS — K219 Gastro-esophageal reflux disease without esophagitis: Secondary | ICD-10-CM | POA: Diagnosis not present

## 2015-06-02 DIAGNOSIS — F17210 Nicotine dependence, cigarettes, uncomplicated: Secondary | ICD-10-CM | POA: Diagnosis present

## 2015-06-02 LAB — BASIC METABOLIC PANEL
ANION GAP: 7 (ref 5–15)
BUN: 6 mg/dL (ref 6–20)
CHLORIDE: 105 mmol/L (ref 101–111)
CO2: 29 mmol/L (ref 22–32)
Calcium: 8.6 mg/dL — ABNORMAL LOW (ref 8.9–10.3)
Creatinine, Ser: 0.62 mg/dL (ref 0.44–1.00)
Glucose, Bld: 96 mg/dL (ref 65–99)
POTASSIUM: 4.3 mmol/L (ref 3.5–5.1)
SODIUM: 141 mmol/L (ref 135–145)

## 2015-06-02 MED ORDER — ALBUTEROL SULFATE (2.5 MG/3ML) 0.083% IN NEBU
2.5000 mg | INHALATION_SOLUTION | Freq: Four times a day (QID) | RESPIRATORY_TRACT | Status: DC
  Administered 2015-06-02 (×3): 2.5 mg via RESPIRATORY_TRACT
  Filled 2015-06-02 (×4): qty 3

## 2015-06-02 MED ORDER — PANTOPRAZOLE SODIUM 40 MG PO TBEC
40.0000 mg | DELAYED_RELEASE_TABLET | Freq: Two times a day (BID) | ORAL | Status: DC
  Administered 2015-06-02 (×2): 40 mg via ORAL
  Filled 2015-06-02 (×2): qty 1

## 2015-06-02 MED ORDER — OXYCODONE HCL 5 MG/5ML PO SOLN
5.0000 mg | ORAL | Status: DC | PRN
  Administered 2015-06-02: 5 mg via ORAL
  Filled 2015-06-02: qty 5

## 2015-06-02 MED ORDER — WHITE PETROLATUM GEL
Status: AC
  Filled 2015-06-02: qty 1

## 2015-06-02 MED ORDER — ONDANSETRON HCL 4 MG/2ML IJ SOLN
8.0000 mg | Freq: Four times a day (QID) | INTRAMUSCULAR | Status: AC
  Administered 2015-06-02 – 2015-06-03 (×3): 8 mg via INTRAVENOUS
  Filled 2015-06-02 (×2): qty 4

## 2015-06-02 NOTE — Progress Notes (Addendum)
2 Days Post-Op  Subjective: Stable and alert.  Afebrile.  Heart rate 81.  BP 127/70.  SPO2 95% on room air. Swallow looked good yesterday.  No leak or obstruction.  Started clear liquids  Complains of chronic back pain and now bilateral hip pain and burning sensation down to knees.  She's had this before.  She thinks it's due to inactivity and wants to walk more. Had one episode of nausea and extremely low volume emesis. Notices some left shoulder pain.  I told her this might be due to air or a little bit of blood.  Should be self-limited.  Treated for potassium 2.8 yesterday.  Potassium 4.3 this morning.  Electrolytes look normal.  Objective: Vital signs in last 24 hours: Temp:  [98.2 F (36.8 C)-98.6 F (37 C)] 98.3 F (36.8 C) (10/30 0655) Pulse Rate:  [65-81] 81 (10/30 0655) Resp:  [14-18] 16 (10/30 0655) BP: (107-132)/(56-70) 127/70 mmHg (10/30 0655) SpO2:  [89 %-100 %] 95 % (10/30 0655) Last BM Date: 05/30/15  Intake/Output from previous day: 10/29 0701 - 10/30 0700 In: 818.3 [P.O.:250; I.V.:368.3; IV Piggyback:200] Out: 625 [Urine:625] Intake/Output this shift:    EXAM: General appearance: Sitting up in chair.  Mild distress.  Does not look toxic.  Mental status normal.  Husband present. Resp: Has some rhonchi or crackles at both bases GI: Soft.  Appropriately tender.  Active bowel sounds.  Not distended.  Wounds look good.  Extremities: Extends and flexes knees with 5 out of 5 strength.  Plantar and dorsiflexion of both ankles 5 out of 5 strength.  No gross sensory deficit of feet  Lab Results:  Results for orders placed or performed during the hospital encounter of 05/31/15 (from the past 24 hour(s))  Basic metabolic panel     Status: Abnormal   Collection Time: 06/02/15  5:43 AM  Result Value Ref Range   Sodium 141 135 - 145 mmol/L   Potassium 4.3 3.5 - 5.1 mmol/L   Chloride 105 101 - 111 mmol/L   CO2 29 22 - 32 mmol/L   Glucose, Bld 96 65 - 99 mg/dL   BUN 6 6  - 20 mg/dL   Creatinine, Ser 0.62 0.44 - 1.00 mg/dL   Calcium 8.6 (L) 8.9 - 10.3 mg/dL   GFR calc non Af Amer >60 >60 mL/min   GFR calc Af Amer >60 >60 mL/min   Anion gap 7 5 - 15     Studies/Results: Dg Esophagus W/water Sol Cm  06/01/2015  CLINICAL DATA:  Repair of hiatal hernia with Nissen fundoplication on 16/05/9603. EXAM: ESOPHOGRAM TECHNIQUE: Single contrast examination was performed using water soluble contrast. The patient ingested 90 cc of Omnipaque 300. FLUOROSCOPY TIME:  Fluoroscopy Time:  1 minute 29 seconds COMPARISON: Esophagram dated 02/15/2015 FINDINGS: Hiatal hernia has been repaired. Nissen fundoplication is intact. No evidence of extravasation. Contrast passes through the gastroesophageal junction without significant delay. IMPRESSION: Satisfactory appearance of the distal esophagus after repair of hiatal hernia with a Nissen fundoplication. No extravasation. Electronically Signed   By: Lorriane Shire M.D.   On: 06/01/2015 12:26    . antiseptic oral rinse  7 mL Mouth Rinse q12n4p  . chlorhexidine  15 mL Mouth Rinse BID  . enoxaparin (LOVENOX) injection  40 mg Subcutaneous Q24H  . nicotine  14 mg Transdermal Daily  . ondansetron  8 mg Intravenous Q6H  . pantoprazole (PROTONIX) IV  40 mg Intravenous Q12H  . white petrolatum  Assessment/Plan: s/p Procedure(s): LAPAROSCOPIC REPAIR OF HIATAL HERNIA  AND LAPAROSCOPIC NISSEN FUNDOPLICATION  POD #2. 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 Left shoulder pain should be self-limited.  Possibly a little blood in the left subphrenic space.  I explained this to the patient Treatment nausea aggressively.  Zofran every 6 hours for 4 doses. Discontinued dilaudid due to nausea Try oxycodone liquid sparingly. Does not meet discharge criteria.  Admit.  I  advised the patient I will be unable to make rounds for the next several days.  Dr. Jackolyn Confer has agreed to assume her care in my absence.    Back pain with bilateral hip and knee pain.   Sounds neuropathic.  Sounds chronic.   Neurovascular exam intact. Increase ambulation.  Decreased time in bed If this progresses will will need ortho or neurosurgical consult.   Hypokalemia-resolved.  Tobacco abuse. Declines nicotine patch. Encouraged stop smoking. Chest x-ray today. (rhonchi)   Push incentive spirometry.  Essential hypertension Cavernous hemangioma of liver - right lobe History laparoscopic cholecystectomy History of bladder suspension procedure with mesh  @PROBHOSP @  LOS: 0 days    Rilya Longo M 06/02/2015  . .prob

## 2015-06-03 ENCOUNTER — Encounter (HOSPITAL_COMMUNITY): Payer: Self-pay | Admitting: General Surgery

## 2015-06-03 MED ORDER — OXYCODONE HCL 5 MG PO TABS: 5.0000 mg | ORAL_TABLET | ORAL | Status: DC | PRN

## 2015-06-03 MED ORDER — ONDANSETRON HCL 4 MG PO TABS: 4.0000 mg | ORAL_TABLET | ORAL | Status: DC | PRN

## 2015-06-03 NOTE — Progress Notes (Signed)
3 Days Post-Op  Subjective: Feels much better. Hip and back pain better.  Tolerating clear liquids.  Passing gas.  No nausea. Walking. Using spirometer.  Wants to go home.  Objective: Vital signs in last 24 hours: Temp:  [98.1 F (36.7 C)-98.4 F (36.9 C)] 98.1 F (36.7 C) (10/31 0557) Pulse Rate:  [68-91] 68 (10/31 0557) Resp:  [18] 18 (10/31 0557) BP: (144-148)/(69-80) 148/69 mmHg (10/31 0557) SpO2:  [92 %-97 %] 96 % (10/31 0557) Last BM Date: 05/30/15  Intake/Output from previous day: 10/30 0701 - 10/31 0700 In: 4925 [P.O.:920; I.V.:4005] Out: -  Intake/Output this shift:    PE: General- In NAD Abdomen-soft, incisions are clean and intact  Lab Results:   Recent Labs  05/31/15 1605 06/01/15 0557  WBC 14.1* 10.2  HGB 12.4 11.8*  HCT 37.1 35.7*  PLT 207 184   BMET  Recent Labs  06/01/15 0557 06/02/15 0543  NA 136 141  K 2.8* 4.3  CL 102 105  CO2 29 29  GLUCOSE 111* 96  BUN 8 6  CREATININE 0.74 0.62  CALCIUM 8.2* 8.6*   PT/INR No results for input(s): LABPROT, INR in the last 72 hours. Comprehensive Metabolic Panel:    Component Value Date/Time   NA 141 06/02/2015 0543   NA 136 06/01/2015 0557   K 4.3 06/02/2015 0543   K 2.8* 06/01/2015 0557   CL 105 06/02/2015 0543   CL 102 06/01/2015 0557   CO2 29 06/02/2015 0543   CO2 29 06/01/2015 0557   BUN 6 06/02/2015 0543   BUN 8 06/01/2015 0557   CREATININE 0.62 06/02/2015 0543   CREATININE 0.74 06/01/2015 0557   GLUCOSE 96 06/02/2015 0543   GLUCOSE 111* 06/01/2015 0557   CALCIUM 8.6* 06/02/2015 0543   CALCIUM 8.2* 06/01/2015 0557   AST 22 05/22/2015 0932   ALT 12* 05/22/2015 0932   ALKPHOS 69 05/22/2015 0932   BILITOT 0.5 05/22/2015 0932   PROT 7.0 05/22/2015 0932   ALBUMIN 3.9 05/22/2015 0932     Studies/Results: Dg Chest 2 View  06/02/2015  CLINICAL DATA:  Soreness in abdomen from recent procedure. EXAM: CHEST  2 VIEW COMPARISON:  05/22/2015 FINDINGS: Normal heart size. There are small  bilateral pleural effusions identified. Opacity in the left lung base may represent atelectasis and or infiltrate. Pulmonary vascular congestion is noted. IMPRESSION: 1. Pulmonary vascular congestion and small pleural effusions. 2. Left base opacity which may represent atelectasis and/or pneumonia. Electronically Signed   By: Kerby Moors M.D.   On: 06/02/2015 11:29   Dg Esophagus W/water Sol Cm  06/01/2015  CLINICAL DATA:  Repair of hiatal hernia with Nissen fundoplication on 74/03/1447. EXAM: ESOPHOGRAM TECHNIQUE: Single contrast examination was performed using water soluble contrast. The patient ingested 90 cc of Omnipaque 300. FLUOROSCOPY TIME:  Fluoroscopy Time:  1 minute 29 seconds COMPARISON: Esophagram dated 02/15/2015 FINDINGS: Hiatal hernia has been repaired. Nissen fundoplication is intact. No evidence of extravasation. Contrast passes through the gastroesophageal junction without significant delay. IMPRESSION: Satisfactory appearance of the distal esophagus after repair of hiatal hernia with a Nissen fundoplication. No extravasation. Electronically Signed   By: Lorriane Shire M.D.   On: 06/01/2015 12:26    Anti-infectives: Anti-infectives    Start     Dose/Rate Route Frequency Ordered Stop   05/31/15 0800  ceFAZolin (ANCEF) IVPB 2 g/50 mL premix     2 g 100 mL/hr over 30 Minutes Intravenous To [facility] 05/30/15 1321 05/31/15 0932  Assessment Principal Problem:   Paraesophageal hernia with obstruction but no gangrene s/p repair and Nissen fundoplication-significantly improved overall today.    LOS: 1 day   Plan: Discharge. Instructions given to her.   Julie Day 06/03/2015

## 2015-06-03 NOTE — Discharge Instructions (Signed)
Home Instructions No lifting, pushing or pulling over 10 pounds until released to do so by MD May drive when pain-free Use spirometer every hour while awake Do not overeat Stick with level 1 diet (see below) until told otherwise by MD See Dr. Dalbert Batman in office in 2-3 weeks; call to make appointment Call for high fever (> 101), persistent nausea, vomiting, wound problems     EATING AFTER YOUR ESOPHAGEAL SURGERY (Stomach Fundoplication, Hiatal Hernia repair, Achalasia surgery, etc)  After your esophageal surgery, expect some sticking with swallowing over the next 1-2 months.    If food sticks when you eat, it is called "dysphagia".  This is due to swelling around your esophagus at the wrap & hiatal diaphragm repair.  It will gradually ease off over the next few months.  To help you through this temporary phase, we start you out on a pureed (blenderized) diet.  Your first meal in the hospital was thin liquids.  You should have been given a pureed diet by the time you left the hospital.  We ask patients to stay on a pureed diet for the first 2-3 weeks to avoid anything getting "stuck" near your recent surgery.  Don't be alarmed if your ability to swallow doesn't progress according to this plan.  Everyone is different and some diets can advance more or less quickly.     Some BASIC RULES to follow are:  Maintain an upright position whenever eating or drinking.  Take small bites - just a teaspoon size bite at a time.  Eat slowly.  It may also help to eat only one food at a time.  Consider nibbling through smaller, more frequent meals & avoid the urge to eat BIG meals  Do not push through feelings of fullness, nausea, or bloatedness  Do not mix solid foods and liquids in the same mouthful  Try not to "wash foods down" with large gulps of liquids.  Avoid carbonated (bubbly/fizzy) drinks.  Do not use straws  Avoid foods that make you feel gassy or bloated.  Start with bland foods first.   Wait on trying greasy, fried, or spicy meals until you are tolerating more bland solids well.  Understand that it will be hard to burp and belch at first.  This gradually improves with time.  Expect to be more gassy/flatulent/bloated initially.  Walking will help your body manage it better.  Consider using medications for bloating that contain simethicone such as  Maalox or Gas-X   Eat in a relaxed atmosphere & minimize distractions.  Avoid talking while eating.    Do not use straws.  Following each meal, sit in an upright position (90 degree angle) for 60 to 90 minutes.  Going for a short walk can help as well  If food does stick, don't panic.  Try to relax and let the food pass on its own.  Sipping WARM LIQUID such as strong hot black tea can also help slide it down.   Be gradual in changes & use common sense:  -If you are having trouble swallowing a particular food, then avoid it.   -If food is sticking when you advance your diet, go back to thinner previous diet (the lower LEVEL) for 1-2 days.  LEVEL 1 = PUREED DIET  Do for the first 2-3 WEEKS AFTER SURGERY  -Foods in this group are pureed or blenderized to a smooth, mashed potato-like consistency.  -If necessary, the pureed foods can keep their shape with the addition of a  thickening agent.   -Meat should be pureed to a smooth, pasty consistency.  Hot broth or gravy may be added to the pureed meat, approximately 1 oz. of liquid per 3 oz. serving of meat. -CAUTION:  If any foods do not puree into a smooth consistency, swallowing will be more difficult.  (For example, nuts or seeds sometimes do not blend well.)  Hot Foods Cold Foods  Pureed scrambled eggs and cheese Pureed cottage cheese  Baby cereals Thickened juices and nectars  Thinned cooked cereals (no lumps) Thickened milk or eggnog  Pureed [place] toast or pancakes Ensure  Mashed potatoes Ice cream  Pureed parsley, au gratin, scalloped potatoes, candied sweet potatoes  Fruit or [place] ice, sherbet  Pureed buttered or alfredo noodles Plain yogurt  Pureed vegetables (no corn or peas) Instant breakfast  Pureed soups and creamed soups Smooth pudding, mousse, custard  Pureed scalloped apples Whipped gelatin  Gravies Sugar, syrup, honey, jelly  Sauces, cheese, tomato, barbecue, white, creamed Cream  Any baby food Creamer  Alcohol in moderation (not beer or champagne) Margarine  Coffee or tea Mayonnaise   Ketchup, mustard   Apple sauce   SAMPLE MENU:  PUREED DIET Breakfast Lunch Dinner   Orange juice, 1/2 cup  Cream of wheat, 1/2 cup  Pineapple juice, 1/2 cup  Pureed [place], barley soup, 3/4 cup  Pureed Hawaiian chicken, 3 oz   Scrambled eggs, mashed or blended with cheese, 1/2 cup  Tea or coffee, 1 cup   Whole milk, 1 cup   Non-dairy creamer, 2 Tbsp.  Mashed potatoes, 1/2 cup  Pureed cooled broccoli, 1/2 cup  Apple sauce, 1/2 cup  Coffee or tea  Mashed potatoes, 1/2 cup  Pureed spinach, 1/2 cup  Frozen yogurt, 1/2 cup  Tea or coffee      LEVEL 2 = SOFT DIET  After your first 2 weeks, you can advance to a soft diet.   Keep on this diet until everything goes down easily.  Hot Foods Cold Foods  White fish Cottage cheese  Stuffed fish Junior baby fruit  Baby food meals Semi thickened juices  Minced soft cooked, scrambled, poached eggs nectars  Souffle & omelets Ripe mashed bananas  Cooked cereals Canned fruit, pineapple sauce, milk  potatoes Milkshake  Buttered or Alfredo noodles Custard  Cooked cooled vegetable Puddings, including tapioca  Sherbet Yogurt  Vegetable soup or alphabet soup Fruit ice, [place] ice  Gravies Whipped gelatin  Sugar, syrup, honey, jelly Junior baby desserts  Sauces:  Cheese, creamed, barbecue, tomato, white Cream  Coffee or tea Margarine   SAMPLE MENU:  LEVEL 2 Breakfast Lunch Dinner   Orange juice, 1/2 cup  Oatmeal, 1/2 cup  Scrambled eggs with cheese, 1/2 cup  Decaffeinated tea, 1  cup  Whole milk, 1 cup  Non-dairy creamer, 2 Tbsp  Pineapple juice, 1/2 cup  Minced beef, 3 oz  Gravy, 2 Tbsp  Mashed potatoes, 1/2 cup  Minced fresh broccoli, 1/2 cup  Applesauce, 1/2 cup  Coffee, 1 cup  [place], barley soup, 3/4 cup  Minced Hawaiian chicken, 3 oz  Mashed potatoes, 1/2 cup  Cooked spinach, 1/2 cup  Frozen yogurt, 1/2 cup  Non-dairy creamer, 2 Tbsp      LEVEL 3 = CHOPPED DIET  -After all the foods in level 2 (soft diet) are passing through well you should advance up to more chopped foods.  -It is still important to cut these foods into small pieces and eat slowly.  Hot Foods Cold Foods  Poultry Cottage cheese  Chopped Swedish meatballs Yogurt  Meat salads (ground or flaked meat) Milk  Flaked fish (tuna) Milkshakes  Poached or scrambled eggs Soft, cold, dry cereal  Souffles and omelets Fruit juices or nectars  Cooked cereals Chopped canned fruit  Chopped [place] toast or pancakes Canned fruit cocktail  Noodles or pasta (no rice) Pudding, mousse, custard  Cooked vegetables (no frozen peas, corn, or mixed vegetables) Green salad  Canned small sweet peas Ice cream  Creamed soup or vegetable soup Fruit ice, [place] ice  Pureed vegetable soup or alphabet soup Non-dairy creamer  Ground scalloped apples Margarine  Gravies Mayonnaise  Sauces:  Cheese, creamed, barbecue, tomato, white Ketchup  Coffee or tea Mustard   SAMPLE MENU:  LEVEL 3 Breakfast Lunch Dinner   Orange juice, 1/2 cup  Oatmeal, 1/2 cup  Scrambled eggs with cheese, 1/2 cup  Decaffeinated tea, 1 cup  Whole milk, 1 cup  Non-dairy creamer, 2 Tbsp  Ketchup, 1 Tbsp  Margarine, 1 tsp  Salt, 1/4 tsp  Sugar, 2 tsp  Pineapple juice, 1/2 cup  Ground beef, 3 oz  Gravy, 2 Tbsp  Mashed potatoes, 1/2 cup  Cooked spinach, 1/2 cup  Applesauce, 1/2 cup  Decaffeinated coffee  Whole milk  Non-dairy creamer, 2 Tbsp  Margarine, 1 tsp  Salt, 1/4 tsp  Pureed [place],  barley soup, 3/4 cup  Barbecue chicken, 3 oz  Mashed potatoes, 1/2 cup  Ground fresh broccoli, 1/2 cup  Frozen yogurt, 1/2 cup  Decaffeinated tea, 1 cup  Non-dairy creamer, 2 Tbsp  Margarine, 1 tsp  Salt, 1/4 tsp  Sugar, 1 tsp    LEVEL 4:  REGULAR FOODS  -Foods in this group are soft, moist, regularly textured foods.   -This level includes meat and breads, which tend to be the hardest things to swallow.   -Eat very slowly, chew well and continue to avoid carbonated drinks. -most people are at this level in 4-6 weeks  Hot Foods Cold Foods  Baked fish or skinned Soft cheeses - cottage cheese  Souffles and omelets Cream cheese  Eggs Yogurt  Stuffed shells Milk  Spaghetti with meat sauce Milkshakes  Cooked cereal Cold dry cereals (no nuts, dried fruit, coconut)  [place] toast or pancakes Crackers  Buttered toast Fruit juices or nectars  Noodles or pasta (no rice) Canned fruit  Potatoes (all types) Ripe bananas  Soft, cooked vegetables (no corn, lima, or baked beans) Peeled, ripe, fresh fruit  Creamed soups or vegetable soup Cakes (no nuts, dried fruit, coconut)  Canned chicken noodle soup Plain doughnuts  Gravies Ice cream  Bacon dressing Pudding, mousse, custard  Sauces:  Cheese, creamed, barbecue, tomato, white Fruit ice, [place] ice, sherbet  Decaffeinated tea or coffee Whipped gelatin  Pork chops Regular gelatin   Canned fruited gelatin molds   Sugar, syrup, honey, jam, jelly   Cream   Non-dairy   Margarine   Oil   Mayonnaise   Ketchup   Mustard   TROUBLESHOOTING IRREGULAR BOWELS  1) Avoid extremes of bowel movements (no bad constipation/diarrhea)  2) Miralax 17gm mixed in 8oz. water or juice-daily. May use BID as needed.  3) Gas-x,Phazyme, etc. as needed for gas & bloating.  4) Soft,bland diet. No spicy,greasy,fried foods.  5) Prilosec over-the-counter as needed  6) May hold gluten/wheat products from diet to see if symptoms improve.  7) May try  probiotics (Align, Activa, etc) to help calm the bowels down  7) If symptoms become worse call back immediately.  If you have any questions please call our office at [place]: [phone].EATING AFTER YOUR ESOPHAGEAL SURGERY (Stomach Fundoplication, Hiatal Hernia repair, Achalasia surgery, etc)  After your esophageal surgery, expect some sticking with swallowing over the next 1-2 months.    If food sticks when you eat, it is called "dysphagia".  This is due to swelling around your esophagus at the wrap & hiatal diaphragm repair.  It will gradually ease off over the next few months.  To help you through this temporary phase, we start you out on a pureed (blenderized) diet.  Your first meal in the hospital was thin liquids.  You should have been given a pureed diet by the time you left the hospital.  We ask patients to stay on a pureed diet for the first 2-3 weeks to avoid anything getting "stuck" near your recent surgery.  Don't be alarmed if your ability to swallow doesn't progress according to this plan.  Everyone is different and some diets can advance more or less quickly.     Some BASIC RULES to follow are:  Maintain an upright position whenever eating or drinking.  Take small bites - just a teaspoon size bite at a time.  Eat slowly.  It may also help to eat only one food at a time.  Consider nibbling through smaller, more frequent meals & avoid the urge to eat BIG meals  Do not push through feelings of fullness, nausea, or bloatedness  Do not mix solid foods and liquids in the same mouthful  Try not to "wash foods down" with large gulps of liquids.  Avoid carbonated (bubbly/fizzy) drinks.    Avoid foods that make you feel gassy or bloated.  Start with bland foods first.  Wait on trying greasy, fried, or spicy meals until you are tolerating more bland solids well.  Understand that it will be hard to burp and belch at first.  This gradually improves with time.   Expect to be more gassy/flatulent/bloated initially.  Walking will help your body manage it better.  Consider using medications for bloating that contain simethicone such as  Maalox or Gas-X   Eat in a relaxed atmosphere & minimize distractions.  Avoid talking while eating.    Do not use straws.  Following each meal, sit in an upright position (90 degree angle) for 60 to 90 minutes.  Going for a short walk can help as well  If food does stick, don't panic.  Try to relax and let the food pass on its own.  Sipping WARM LIQUID such as strong hot black tea can also help slide it down.   Be gradual in changes & use common sense:  -If you easily tolerating a certain "level" of foods, advance to the next level gradually -If you are having trouble swallowing a particular food, then avoid it.   -If food is sticking when you advance your diet, go back to thinner previous diet (the lower LEVEL) for 1-2 days.  LEVEL 1 = PUREED DIET  Do for the first 2 WEEKS AFTER SURGERY  -Foods in this group are pureed or blenderized to a smooth, mashed potato-like consistency.  -If necessary, the pureed foods can keep their shape with the addition of a thickening agent.   -Meat should be pureed to a smooth, pasty consistency.  Hot broth or gravy may be added to the pureed meat, approximately 1 oz. of liquid per 3 oz. serving of meat. -CAUTION:  If any foods do not puree  into a smooth consistency, swallowing will be more difficult.  (For example, nuts or seeds sometimes do not blend well.)  Hot Foods Cold Foods  Pureed scrambled eggs and cheese Pureed cottage cheese  Baby cereals Thickened juices and nectars  Thinned cooked cereals (no lumps) Thickened milk or eggnog  Pureed [place] toast or pancakes Ensure  Mashed potatoes Ice cream  Pureed parsley, au gratin, scalloped potatoes, candied sweet potatoes Fruit or [place] ice, sherbet  Pureed buttered or alfredo noodles Plain yogurt  Pureed vegetables (no  corn or peas) Instant breakfast  Pureed soups and creamed soups Smooth pudding, mousse, custard  Pureed scalloped apples Whipped gelatin  Gravies Sugar, syrup, honey, jelly  Sauces, cheese, tomato, barbecue, white, creamed Cream  Any baby food Creamer  Alcohol in moderation (not beer or champagne) Margarine  Coffee or tea Mayonnaise   Ketchup, mustard   Apple sauce   SAMPLE MENU:  PUREED DIET Breakfast Lunch Dinner   Orange juice, 1/2 cup  Cream of wheat, 1/2 cup  Pineapple juice, 1/2 cup  Pureed [place], barley soup, 3/4 cup  Pureed Hawaiian chicken, 3 oz   Scrambled eggs, mashed or blended with cheese, 1/2 cup  Tea or coffee, 1 cup   Whole milk, 1 cup   Non-dairy creamer, 2 Tbsp.  Mashed potatoes, 1/2 cup  Pureed cooled broccoli, 1/2 cup  Apple sauce, 1/2 cup  Coffee or tea  Mashed potatoes, 1/2 cup  Pureed spinach, 1/2 cup  Frozen yogurt, 1/2 cup  Tea or coffee      LEVEL 2 = SOFT DIET  After your first 2 weeks, you can advance to a soft diet.   Keep on this diet until everything goes down easily.  Hot Foods Cold Foods  White fish Cottage cheese  Stuffed fish Junior baby fruit  Baby food meals Semi thickened juices  Minced soft cooked, scrambled, poached eggs nectars  Souffle & omelets Ripe mashed bananas  Cooked cereals Canned fruit, pineapple sauce, milk  potatoes Milkshake  Buttered or Alfredo noodles Custard  Cooked cooled vegetable Puddings, including tapioca  Sherbet Yogurt  Vegetable soup or alphabet soup Fruit ice, [place] ice  Gravies Whipped gelatin  Sugar, syrup, honey, jelly Junior baby desserts  Sauces:  Cheese, creamed, barbecue, tomato, white Cream  Coffee or tea Margarine   SAMPLE MENU:  LEVEL 2 Breakfast Lunch Dinner   Orange juice, 1/2 cup  Oatmeal, 1/2 cup  Scrambled eggs with cheese, 1/2 cup  Decaffeinated tea, 1 cup  Whole milk, 1 cup  Non-dairy creamer, 2 Tbsp  Pineapple juice, 1/2 cup  Minced beef, 3  oz  Gravy, 2 Tbsp  Mashed potatoes, 1/2 cup  Minced fresh broccoli, 1/2 cup  Applesauce, 1/2 cup  Coffee, 1 cup  [place], barley soup, 3/4 cup  Minced [place] chicken, 3 oz  Mashed potatoes, 1/2 cup  Cooked spinach, 1/2 cup  Frozen yogurt, 1/2 cup  Non-dairy creamer, 2 Tbsp      LEVEL 3 = CHOPPED DIET  -After all the foods in level 2 (soft diet) are passing through well you should advance up to more chopped foods.  -It is still important to cut these foods into small pieces and eat slowly.  Hot Foods Cold Foods  Poultry Cottage cheese  Chopped Swedish meatballs Yogurt  Meat salads (ground or flaked meat) Milk  Flaked fish (tuna) Milkshakes  Poached or scrambled eggs Soft, cold, dry cereal  Souffles and omelets Fruit juices or nectars  Cooked cereals Chopped canned  fruit  Chopped [place] toast or pancakes Canned fruit cocktail  Noodles or pasta (no rice) Pudding, mousse, custard  Cooked vegetables (no frozen peas, corn, or mixed vegetables) Green salad  Canned small sweet peas Ice cream  Creamed soup or vegetable soup Fruit ice, [place] ice  Pureed vegetable soup or alphabet soup Non-dairy creamer  Ground scalloped apples Margarine  Gravies Mayonnaise  Sauces:  Cheese, creamed, barbecue, tomato, white Ketchup  Coffee or tea Mustard   SAMPLE MENU:  LEVEL 3 Breakfast Lunch Dinner   Orange juice, 1/2 cup  Oatmeal, 1/2 cup  Scrambled eggs with cheese, 1/2 cup  Decaffeinated tea, 1 cup  Whole milk, 1 cup  Non-dairy creamer, 2 Tbsp  Ketchup, 1 Tbsp  Margarine, 1 tsp  Salt, 1/4 tsp  Sugar, 2 tsp  Pineapple juice, 1/2 cup  Ground beef, 3 oz  Gravy, 2 Tbsp  Mashed potatoes, 1/2 cup  Cooked spinach, 1/2 cup  Applesauce, 1/2 cup  Decaffeinated coffee  Whole milk  Non-dairy creamer, 2 Tbsp  Margarine, 1 tsp  Salt, 1/4 tsp  Pureed [place], barley soup, 3/4 cup  Barbecue chicken, 3 oz  Mashed potatoes, 1/2 cup  Ground fresh broccoli,  1/2 cup  Frozen yogurt, 1/2 cup  Decaffeinated tea, 1 cup  Non-dairy creamer, 2 Tbsp  Margarine, 1 tsp  Salt, 1/4 tsp  Sugar, 1 tsp    LEVEL 4:  REGULAR FOODS  -Foods in this group are soft, moist, regularly textured foods.   -This level includes meat and breads, which tend to be the hardest things to swallow.   -Eat very slowly, chew well and continue to avoid carbonated drinks. -most people are at this level in 4-6 weeks  Hot Foods Cold Foods  Baked fish or skinned Soft cheeses - cottage cheese  Souffles and omelets Cream cheese  Eggs Yogurt  Stuffed shells Milk  Spaghetti with meat sauce Milkshakes  Cooked cereal Cold dry cereals (no nuts, dried fruit, coconut)  [place] toast or pancakes Crackers  Buttered toast Fruit juices or nectars  Noodles or pasta (no rice) Canned fruit  Potatoes (all types) Ripe bananas  Soft, cooked vegetables (no corn, lima, or baked beans) Peeled, ripe, fresh fruit  Creamed soups or vegetable soup Cakes (no nuts, dried fruit, coconut)  Canned chicken noodle soup Plain doughnuts  Gravies Ice cream  Bacon dressing Pudding, mousse, custard  Sauces:  Cheese, creamed, barbecue, tomato, white Fruit ice, [place] ice, sherbet  Decaffeinated tea or coffee Whipped gelatin  Pork chops Regular gelatin   Canned fruited gelatin molds   Sugar, syrup, honey, jam, jelly   Cream   Non-dairy   Margarine   Oil   Mayonnaise   Ketchup   Mustard   TROUBLESHOOTING IRREGULAR BOWELS  1) Avoid extremes of bowel movements (no bad constipation/diarrhea)  2) Miralax 17gm mixed in 8oz. water or juice-daily. May use BID as needed.  3) Gas-x,Phazyme, etc. as needed for gas & bloating.  4) Soft,bland diet. No spicy,greasy,fried foods.  5) Prilosec over-the-counter as needed  6) May hold gluten/wheat products from diet to see if symptoms improve.  7) May try probiotics (Align, Activa, etc) to help calm the bowels down  7) If symptoms become worse call back  immediately.    If you have any questions please call our office at [place]: [phone].EATING AFTER YOUR ESOPHAGEAL SURGERY (Stomach Fundoplication, Hiatal Hernia repair, Achalasia surgery, etc)  After your esophageal surgery, expect some sticking with swallowing over the next 1-2  months.    If food sticks when you eat, it is called "dysphagia".  This is due to swelling around your esophagus at the wrap & hiatal diaphragm repair.  It will gradually ease off over the next few months.  To help you through this temporary phase, we start you out on a pureed (blenderized) diet.  Your first meal in the hospital was thin liquids.  You should have been given a pureed diet by the time you left the hospital.  We ask patients to stay on a pureed diet for the first 2-3 weeks to avoid anything getting "stuck" near your recent surgery.  Don't be alarmed if your ability to swallow doesn't progress according to this plan.  Everyone is different and some diets can advance more or less quickly.     Some BASIC RULES to follow are:  Maintain an upright position whenever eating or drinking.  Take small bites - just a teaspoon size bite at a time.  Eat slowly.  It may also help to eat only one food at a time.  Consider nibbling through smaller, more frequent meals & avoid the urge to eat BIG meals  Do not push through feelings of fullness, nausea, or bloatedness  Do not mix solid foods and liquids in the same mouthful  Try not to "wash foods down" with large gulps of liquids.  Avoid carbonated (bubbly/fizzy) drinks.    Avoid foods that make you feel gassy or bloated.  Start with bland foods first.  Wait on trying greasy, fried, or spicy meals until you are tolerating more bland solids well.  Understand that it will be hard to burp and belch at first.  This gradually improves with time.  Expect to be more gassy/flatulent/bloated initially.  Walking will help your body manage it  better.  Consider using medications for bloating that contain simethicone such as  Maalox or Gas-X   Eat in a relaxed atmosphere & minimize distractions.  Avoid talking while eating.    Do not use straws.  Following each meal, sit in an upright position (90 degree angle) for 60 to 90 minutes.  Going for a short walk can help as well  If food does stick, don't panic.  Try to relax and let the food pass on its own.  Sipping WARM LIQUID such as strong hot black tea can also help slide it down.   Be gradual in changes & use common sense:  -If you easily tolerating a certain "level" of foods, advance to the next level gradually -If you are having trouble swallowing a particular food, then avoid it.   -If food is sticking when you advance your diet, go back to thinner previous diet (the lower LEVEL) for 1-2 days.  LEVEL 1 = PUREED DIET  Do for the first 2 WEEKS AFTER SURGERY  -Foods in this group are pureed or blenderized to a smooth, mashed potato-like consistency.  -If necessary, the pureed foods can keep their shape with the addition of a thickening agent.   -Meat should be pureed to a smooth, pasty consistency.  Hot broth or gravy may be added to the pureed meat, approximately 1 oz. of liquid per 3 oz. serving of meat. -CAUTION:  If any foods do not puree into a smooth consistency, swallowing will be more difficult.  (For example, nuts or seeds sometimes do not blend well.)  Hot Foods Cold Foods  Pureed scrambled eggs and cheese Pureed cottage cheese  Baby cereals Thickened juices and  nectars  Thinned cooked cereals (no lumps) Thickened milk or eggnog  Pureed [place] toast or pancakes Ensure  Mashed potatoes Ice cream  Pureed parsley, au gratin, scalloped potatoes, candied sweet potatoes Fruit or [place] ice, sherbet  Pureed buttered or alfredo noodles Plain yogurt  Pureed vegetables (no corn or peas) Instant breakfast  Pureed soups and creamed soups Smooth pudding, mousse,  custard  Pureed scalloped apples Whipped gelatin  Gravies Sugar, syrup, honey, jelly  Sauces, cheese, tomato, barbecue, white, creamed Cream  Any baby food Creamer  Alcohol in moderation (not beer or champagne) Margarine  Coffee or tea Mayonnaise   Ketchup, mustard   Apple sauce   SAMPLE MENU:  PUREED DIET Breakfast Lunch Dinner   [place] juice, 1/2 cup  Cream of wheat, 1/2 cup  Pineapple juice, 1/2 cup  Pureed [place], barley soup, 3/4 cup  Pureed [place] chicken, 3 oz   Scrambled eggs, mashed or blended with cheese, 1/2 cup  Tea or coffee, 1 cup   Whole milk, 1 cup   Non-dairy creamer, 2 Tbsp.  Mashed potatoes, 1/2 cup  Pureed cooled broccoli, 1/2 cup  Apple sauce, 1/2 cup  Coffee or tea  Mashed potatoes, 1/2 cup  Pureed spinach, 1/2 cup  Frozen yogurt, 1/2 cup  Tea or coffee      LEVEL 2 = SOFT DIET  After your first 2 weeks, you can advance to a soft diet.   Keep on this diet until everything goes down easily.  Hot Foods Cold Foods  White fish Cottage cheese  Stuffed fish Junior baby fruit  Baby food meals Semi thickened juices  Minced soft cooked, scrambled, poached eggs nectars  Souffle & omelets Ripe mashed bananas  Cooked cereals Canned fruit, pineapple sauce, milk  potatoes Milkshake  Buttered or Alfredo noodles Custard  Cooked cooled vegetable Puddings, including tapioca  Sherbet Yogurt  Vegetable soup or alphabet soup Fruit ice, [place] ice  Gravies Whipped gelatin  Sugar, syrup, honey, jelly Junior baby desserts  Sauces:  Cheese, creamed, barbecue, tomato, white Cream  Coffee or tea Margarine   SAMPLE MENU:  LEVEL 2 Breakfast Lunch Dinner   Orange juice, 1/2 cup  Oatmeal, 1/2 cup  Scrambled eggs with cheese, 1/2 cup  Decaffeinated tea, 1 cup  Whole milk, 1 cup  Non-dairy creamer, 2 Tbsp  Pineapple juice, 1/2 cup  Minced beef, 3 oz  Gravy, 2 Tbsp  Mashed potatoes, 1/2 cup  Minced fresh broccoli, 1/2 cup  Applesauce,  1/2 cup  Coffee, 1 cup  [place], barley soup, 3/4 cup  Minced Hawaiian chicken, 3 oz  Mashed potatoes, 1/2 cup  Cooked spinach, 1/2 cup  Frozen yogurt, 1/2 cup  Non-dairy creamer, 2 Tbsp      LEVEL 3 = CHOPPED DIET  -After all the foods in level 2 (soft diet) are passing through well you should advance up to more chopped foods.  -It is still important to cut these foods into small pieces and eat slowly.  Hot Foods Cold Foods  Poultry Cottage cheese  Chopped Swedish meatballs Yogurt  Meat salads (ground or flaked meat) Milk  Flaked fish (tuna) Milkshakes  Poached or scrambled eggs Soft, cold, dry cereal  Souffles and omelets Fruit juices or nectars  Cooked cereals Chopped canned fruit  Chopped [place] toast or pancakes Canned fruit cocktail  Noodles or pasta (no rice) Pudding, mousse, custard  Cooked vegetables (no frozen peas, corn, or mixed vegetables) Green salad  Canned small sweet peas Ice cream  Creamed  soup or vegetable soup Fruit ice, [place] ice  Pureed vegetable soup or alphabet soup Non-dairy creamer  Ground scalloped apples Margarine  Gravies Mayonnaise  Sauces:  Cheese, creamed, barbecue, tomato, white Ketchup  Coffee or tea Mustard   SAMPLE MENU:  LEVEL 3 Breakfast Lunch Dinner   Orange juice, 1/2 cup  Oatmeal, 1/2 cup  Scrambled eggs with cheese, 1/2 cup  Decaffeinated tea, 1 cup  Whole milk, 1 cup  Non-dairy creamer, 2 Tbsp  Ketchup, 1 Tbsp  Margarine, 1 tsp  Salt, 1/4 tsp  Sugar, 2 tsp  Pineapple juice, 1/2 cup  Ground beef, 3 oz  Gravy, 2 Tbsp  Mashed potatoes, 1/2 cup  Cooked spinach, 1/2 cup  Applesauce, 1/2 cup  Decaffeinated coffee  Whole milk  Non-dairy creamer, 2 Tbsp  Margarine, 1 tsp  Salt, 1/4 tsp  Pureed [place], barley soup, 3/4 cup  Barbecue chicken, 3 oz  Mashed potatoes, 1/2 cup  Ground fresh broccoli, 1/2 cup  Frozen yogurt, 1/2 cup  Decaffeinated tea, 1 cup  Non-dairy creamer, 2  Tbsp  Margarine, 1 tsp  Salt, 1/4 tsp  Sugar, 1 tsp    LEVEL 4:  REGULAR FOODS  -Foods in this group are soft, moist, regularly textured foods.   -This level includes meat and breads, which tend to be the hardest things to swallow.   -Eat very slowly, chew well and continue to avoid carbonated drinks. -most people are at this level in 4-6 weeks  Hot Foods Cold Foods  Baked fish or skinned Soft cheeses - cottage cheese  Souffles and omelets Cream cheese  Eggs Yogurt  Stuffed shells Milk  Spaghetti with meat sauce Milkshakes  Cooked cereal Cold dry cereals (no nuts, dried fruit, coconut)  [place] toast or pancakes Crackers  Buttered toast Fruit juices or nectars  Noodles or pasta (no rice) Canned fruit  Potatoes (all types) Ripe bananas  Soft, cooked vegetables (no corn, lima, or baked beans) Peeled, ripe, fresh fruit  Creamed soups or vegetable soup Cakes (no nuts, dried fruit, coconut)  Canned chicken noodle soup Plain doughnuts  Gravies Ice cream  Bacon dressing Pudding, mousse, custard  Sauces:  Cheese, creamed, barbecue, tomato, white Fruit ice, [place] ice, sherbet  Decaffeinated tea or coffee Whipped gelatin  Pork chops Regular gelatin   Canned fruited gelatin molds   Sugar, syrup, honey, jam, jelly   Cream   Non-dairy   Margarine   Oil   Mayonnaise   Ketchup   Mustard   TROUBLESHOOTING IRREGULAR BOWELS  1) Avoid extremes of bowel movements (no bad constipation/diarrhea)  2) Miralax 17gm mixed in 8oz. water or juice-daily. May use BID as needed.  3) Gas-x,Phazyme, etc. as needed for gas & bloating.  4) Soft,bland diet. No spicy,greasy,fried foods.  5) Prilosec over-the-counter as needed  6) May hold gluten/wheat products from diet to see if symptoms improve.  7) May try probiotics (Align, Activa, etc) to help calm the bowels down  7) If symptoms become worse call back immediately.    If you have any questions please call our office at [place]: [phone].

## 2015-06-07 NOTE — Discharge Summary (Signed)
Physician Discharge Summary  Patient ID: Julie Day MRN: [id number] DOB/AGE: Apr 16, 1946 69 y.o.  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 Dalbert Batman. Upper GI the day after surgery demonstrated complete reduction of the hernia and no leak. She was started on a clear liquid diet.she had some hypokalemia postoperatively which was corrected. She was coughing some and an exacerbation of her chronic pain and bilateral hip pain from lying in bed. This pain went away with ambulation. Chest x-ray demonstrated findings consistent with atelectasis. Her diet was advanced to full liquids. She tolerated this well. On her third postoperative day she was ambulating, passing gas, tolerating the liquid diet and was ready for discharge. Discharge instructions were given to her.   Disposition: 01-Home or Self Care     Medication List    TAKE these medications        ADVIL 200 MG tablet  Generic drug:  ibuprofen  Take 200 mg by mouth every 6 (six) hours as needed for headache or moderate pain.     cetirizine 10 MG tablet  Commonly known as:  ZYRTEC  Take 10 mg by mouth every morning.     docusate sodium 100 MG capsule  Commonly known as:  COLACE  Take 100 mg by mouth every morning.     estradiol 0.5 MG tablet  Commonly known as:  ESTRACE  Take 0.5 mg by mouth every morning.     nitroGLYCERIN 0.4 MG SL tablet  Commonly known as:  NITROSTAT  Place 0.4 mg under the tongue every 5 (five) minutes as needed for chest pain.     ondansetron 4 MG tablet  Commonly known as:  ZOFRAN  Take 1 tablet (4 mg total) by mouth every 4 (four) hours as needed for nausea or vomiting.     oxybutynin 10 MG 24 hr tablet  Commonly known as:  DITROPAN-XL  Take 10 mg by mouth at bedtime.     oxyCODONE 5 MG immediate release tablet  Commonly known as:  Oxy IR/ROXICODONE  Take 1-2 tablets (5-10 mg total) by mouth every 4 (four) hours as needed for moderate pain, severe pain or breakthrough pain.     pantoprazole 40 MG tablet  Commonly known as:  PROTONIX  Take 40 mg by mouth 2 (two) times daily.     triamterene-hydrochlorothiazide 37.5-25 MG tablet  Commonly known as:  MAXZIDE-25  Take 1 tablet by mouth every morning.     VITAMIN D PO  Take 1 tablet by mouth every morning.         Signed: Odis Hollingshead 06/07/2015, 10:15 AM

## 2015-06-17 ENCOUNTER — Other Ambulatory Visit: Payer: Self-pay | Admitting: General Surgery

## 2015-06-17 DIAGNOSIS — K219 Gastro-esophageal reflux disease without esophagitis: Secondary | ICD-10-CM

## 2015-06-17 DIAGNOSIS — K449 Diaphragmatic hernia without obstruction or gangrene: Secondary | ICD-10-CM

## 2015-06-20 DIAGNOSIS — R300 Dysuria: Secondary | ICD-10-CM | POA: Diagnosis not present

## 2015-06-20 DIAGNOSIS — N3001 Acute cystitis with hematuria: Secondary | ICD-10-CM | POA: Diagnosis not present

## 2015-06-26 DIAGNOSIS — Z1231 Encounter for screening mammogram for malignant neoplasm of breast: Secondary | ICD-10-CM | POA: Diagnosis not present

## 2015-07-03 DIAGNOSIS — R928 Other abnormal and inconclusive findings on diagnostic imaging of breast: Secondary | ICD-10-CM | POA: Diagnosis not present

## 2015-07-03 DIAGNOSIS — R922 Inconclusive mammogram: Secondary | ICD-10-CM | POA: Diagnosis not present

## 2015-07-03 DIAGNOSIS — N6002 Solitary cyst of left breast: Secondary | ICD-10-CM | POA: Diagnosis not present

## 2015-07-03 DIAGNOSIS — N6001 Solitary cyst of right breast: Secondary | ICD-10-CM | POA: Diagnosis not present

## 2015-07-03 DIAGNOSIS — Z1231 Encounter for screening mammogram for malignant neoplasm of breast: Secondary | ICD-10-CM | POA: Diagnosis not present

## 2015-07-30 DIAGNOSIS — R350 Frequency of micturition: Secondary | ICD-10-CM | POA: Diagnosis not present

## 2015-07-30 DIAGNOSIS — N3020 Other chronic cystitis without hematuria: Secondary | ICD-10-CM | POA: Diagnosis not present

## 2015-07-31 ENCOUNTER — Ambulatory Visit
Admission: RE | Admit: 2015-07-31 | Discharge: 2015-07-31 | Disposition: A | Discharge status: Home / Self Care | Payer: Medicare HMO | Source: Ambulatory Visit | Attending: General Surgery | Admitting: General Surgery

## 2015-07-31 DIAGNOSIS — K449 Diaphragmatic hernia without obstruction or gangrene: Secondary | ICD-10-CM

## 2015-07-31 DIAGNOSIS — Z8719 Personal history of other diseases of the digestive system: Secondary | ICD-10-CM | POA: Diagnosis not present

## 2015-07-31 DIAGNOSIS — K219 Gastro-esophageal reflux disease without esophagitis: Secondary | ICD-10-CM

## 2015-08-04 DIAGNOSIS — Z8719 Personal history of other diseases of the digestive system: Secondary | ICD-10-CM

## 2015-08-04 HISTORY — DX: Personal history of other diseases of the digestive system: Z87.19

## 2015-09-03 DIAGNOSIS — B351 Tinea unguium: Secondary | ICD-10-CM | POA: Diagnosis not present

## 2015-09-03 DIAGNOSIS — M79676 Pain in unspecified toe(s): Secondary | ICD-10-CM | POA: Diagnosis not present

## 2015-09-04 DIAGNOSIS — B351 Tinea unguium: Secondary | ICD-10-CM | POA: Diagnosis not present

## 2015-09-05 DIAGNOSIS — D225 Melanocytic nevi of trunk: Secondary | ICD-10-CM | POA: Diagnosis not present

## 2015-09-05 DIAGNOSIS — D229 Melanocytic nevi, unspecified: Secondary | ICD-10-CM

## 2015-09-05 DIAGNOSIS — X32XXXA Exposure to sunlight, initial encounter: Secondary | ICD-10-CM | POA: Diagnosis not present

## 2015-09-05 DIAGNOSIS — D485 Neoplasm of uncertain behavior of skin: Secondary | ICD-10-CM | POA: Diagnosis not present

## 2015-09-05 DIAGNOSIS — L570 Actinic keratosis: Secondary | ICD-10-CM | POA: Diagnosis not present

## 2015-09-05 DIAGNOSIS — C4492 Squamous cell carcinoma of skin, unspecified: Secondary | ICD-10-CM

## 2015-09-05 DIAGNOSIS — C44629 Squamous cell carcinoma of skin of left upper limb, including shoulder: Secondary | ICD-10-CM | POA: Diagnosis not present

## 2015-09-05 HISTORY — DX: Melanocytic nevi, unspecified: D22.9

## 2015-09-05 HISTORY — DX: Squamous cell carcinoma of skin, unspecified: C44.92

## 2015-09-11 DIAGNOSIS — Z0000 Encounter for general adult medical examination without abnormal findings: Secondary | ICD-10-CM | POA: Diagnosis not present

## 2015-09-11 DIAGNOSIS — N3020 Other chronic cystitis without hematuria: Secondary | ICD-10-CM | POA: Diagnosis not present

## 2015-09-11 DIAGNOSIS — R350 Frequency of micturition: Secondary | ICD-10-CM | POA: Diagnosis not present

## 2015-09-16 DIAGNOSIS — D485 Neoplasm of uncertain behavior of skin: Secondary | ICD-10-CM | POA: Diagnosis not present

## 2015-11-08 DIAGNOSIS — Z1389 Encounter for screening for other disorder: Secondary | ICD-10-CM | POA: Diagnosis not present

## 2015-11-08 DIAGNOSIS — K219 Gastro-esophageal reflux disease without esophagitis: Secondary | ICD-10-CM | POA: Diagnosis not present

## 2015-11-08 DIAGNOSIS — Z0000 Encounter for general adult medical examination without abnormal findings: Secondary | ICD-10-CM | POA: Diagnosis not present

## 2015-11-08 DIAGNOSIS — I10 Essential (primary) hypertension: Secondary | ICD-10-CM | POA: Diagnosis not present

## 2015-12-03 DIAGNOSIS — B351 Tinea unguium: Secondary | ICD-10-CM | POA: Diagnosis not present

## 2015-12-03 DIAGNOSIS — M79676 Pain in unspecified toe(s): Secondary | ICD-10-CM | POA: Diagnosis not present

## 2016-05-06 DIAGNOSIS — Z23 Encounter for immunization: Secondary | ICD-10-CM | POA: Diagnosis not present

## 2016-05-13 DIAGNOSIS — K219 Gastro-esophageal reflux disease without esophagitis: Secondary | ICD-10-CM | POA: Diagnosis not present

## 2016-05-13 DIAGNOSIS — I10 Essential (primary) hypertension: Secondary | ICD-10-CM | POA: Diagnosis not present

## 2016-06-03 DIAGNOSIS — R69 Illness, unspecified: Secondary | ICD-10-CM | POA: Diagnosis not present

## 2016-07-09 DIAGNOSIS — N6001 Solitary cyst of right breast: Secondary | ICD-10-CM | POA: Diagnosis not present

## 2016-07-09 DIAGNOSIS — N6002 Solitary cyst of left breast: Secondary | ICD-10-CM | POA: Diagnosis not present

## 2016-09-11 DIAGNOSIS — R350 Frequency of micturition: Secondary | ICD-10-CM | POA: Diagnosis not present

## 2016-09-11 DIAGNOSIS — N3020 Other chronic cystitis without hematuria: Secondary | ICD-10-CM | POA: Diagnosis not present

## 2016-09-15 DIAGNOSIS — H25813 Combined forms of age-related cataract, bilateral: Secondary | ICD-10-CM | POA: Diagnosis not present

## 2016-09-15 DIAGNOSIS — H5200 Hypermetropia, unspecified eye: Secondary | ICD-10-CM | POA: Diagnosis not present

## 2016-09-15 DIAGNOSIS — I10 Essential (primary) hypertension: Secondary | ICD-10-CM | POA: Diagnosis not present

## 2016-09-15 DIAGNOSIS — Z0100 Encounter for examination of eyes and vision without abnormal findings: Secondary | ICD-10-CM | POA: Diagnosis not present

## 2016-10-06 DIAGNOSIS — R69 Illness, unspecified: Secondary | ICD-10-CM | POA: Diagnosis not present

## 2016-10-27 IMAGING — RF DG ESOPHAGUS
8 of 9 series · 19 of 24 positions shown · non-contrast
Comparison: CT scan abdomen dated 04/17/2013

CLINICAL DATA: Dysphagia.  Chest pain.

EXAM:
ESOPHOGRAM / BARIUM SWALLOW / BARIUM TABLET STUDY
TECHNIQUE: Combined double contrast and single contrast examination performed
using effervescent crystals, thick barium liquid, and thin barium
liquid. The patient was observed with fluoroscopy swallowing a 13 mm
barium sulphate tablet.
FLUOROSCOPY TIME:  Radiation Exposure Index (as provided by the
fluoroscopic device): 50 d6ycmG

[Series 1: run · 5 of 14 slices shown (1 of 8)]
[im 1/14]
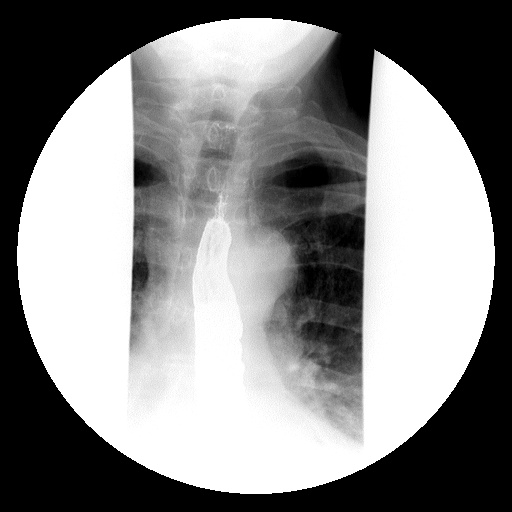
[im 3/14]
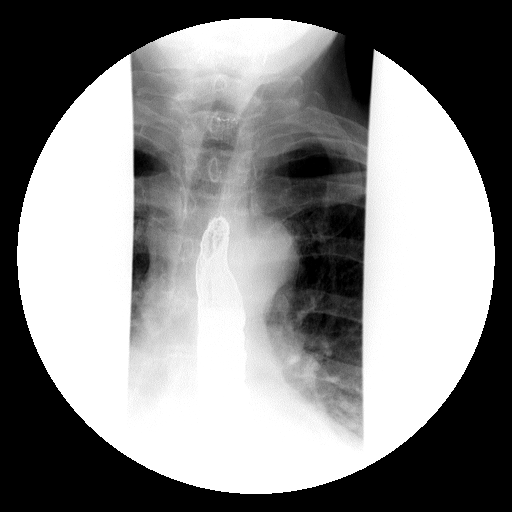
[im 8/14]
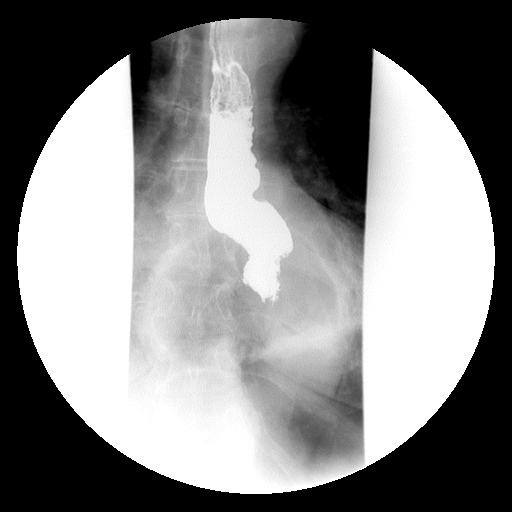
[im 11/14]
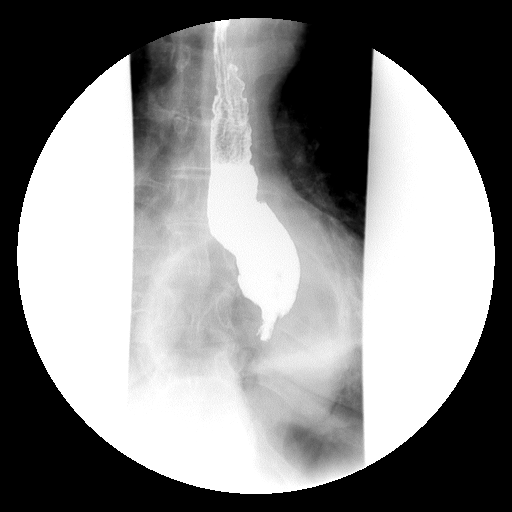
[im 14/14]
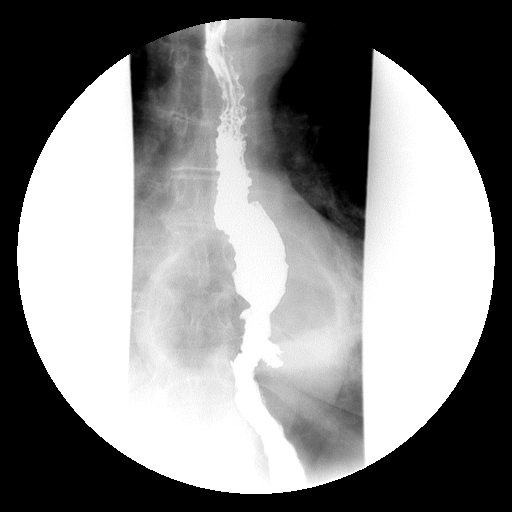

[Series 2: run · 6 of 18 slices shown (2 of 8)]
[im 1/18]
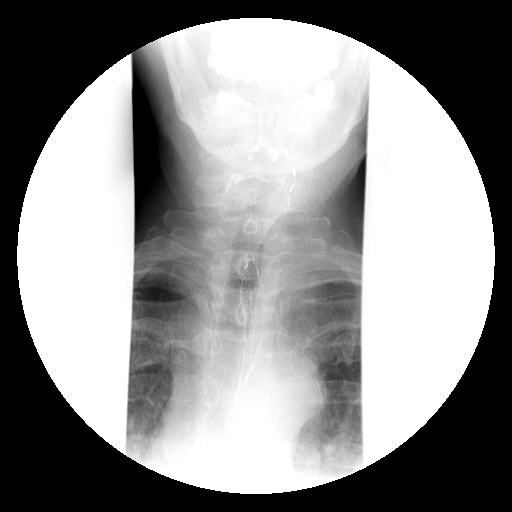
[im 5/18]
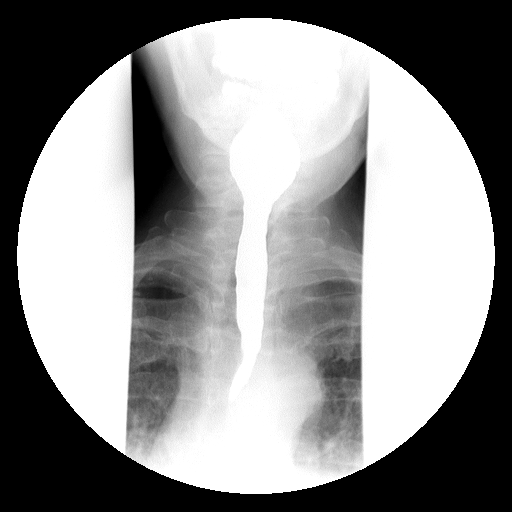
[im 8/18]
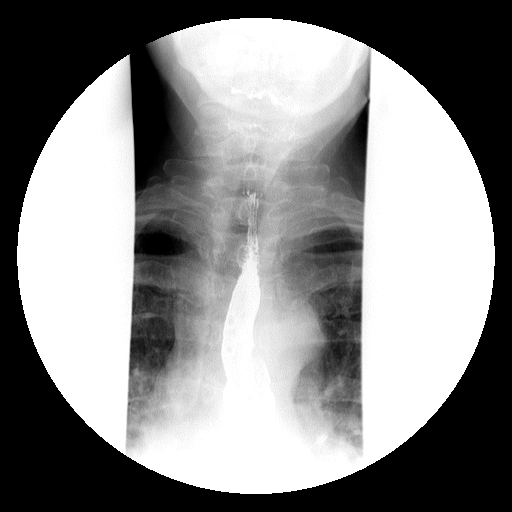
[im 10/18]
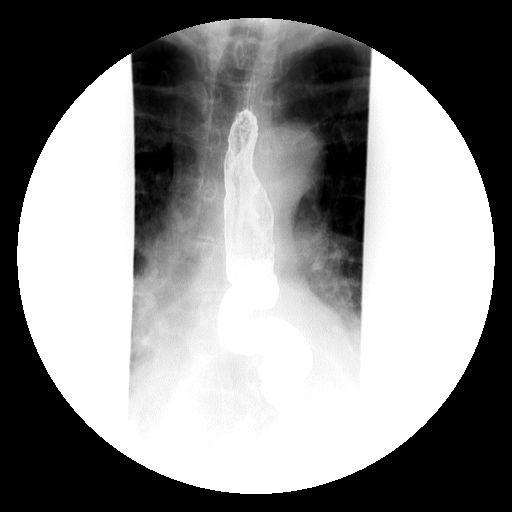
[im 15/18]
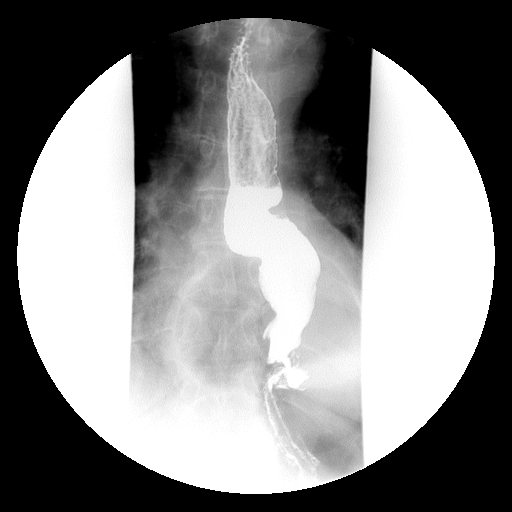
[im 18/18]
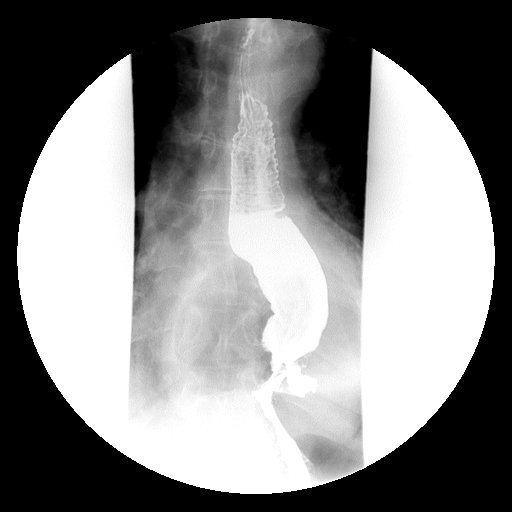

[Series 3: run · 3 of 8 slices shown (3 of 8)]
[im 1/8]
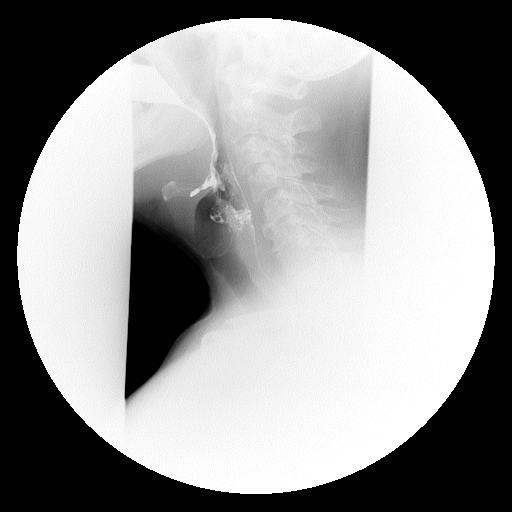
[im 3/8]
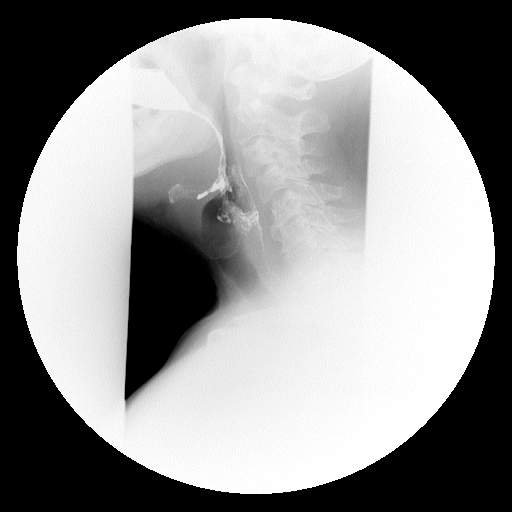
[im 8/8]
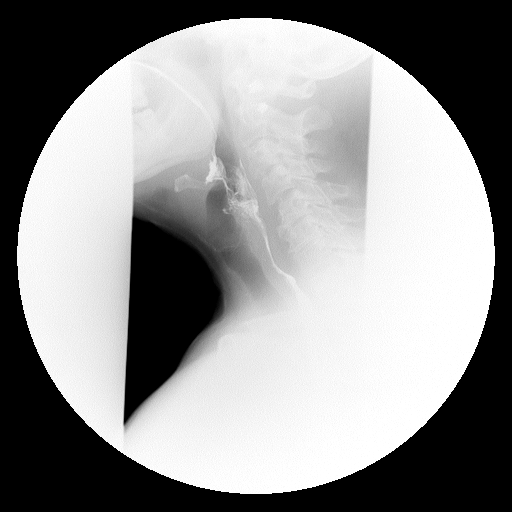

[Series 4: run · 1 of 1 slices shown (4 of 8)]
[im 1/1]
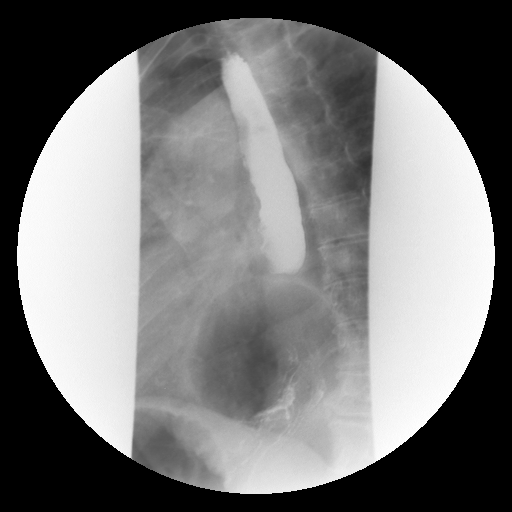

[Series 5: run · 1 of 1 slices shown (5 of 8)]
[im 1/1]
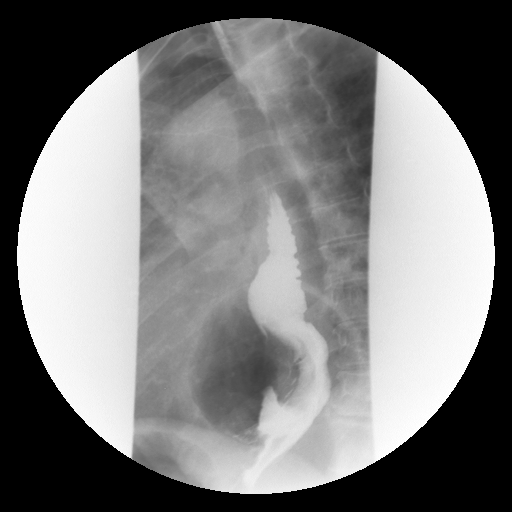

[Series 6: run · 1 of 1 slices shown (6 of 8)]
[im 1/1]
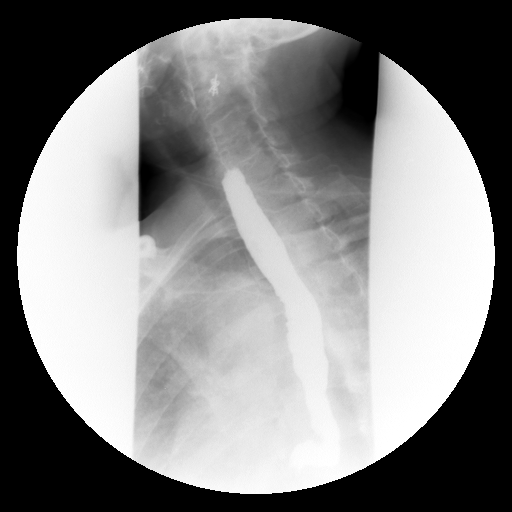

[Series 8: run · 1 of 1 slices shown (7 of 8)]
[im 1/1]
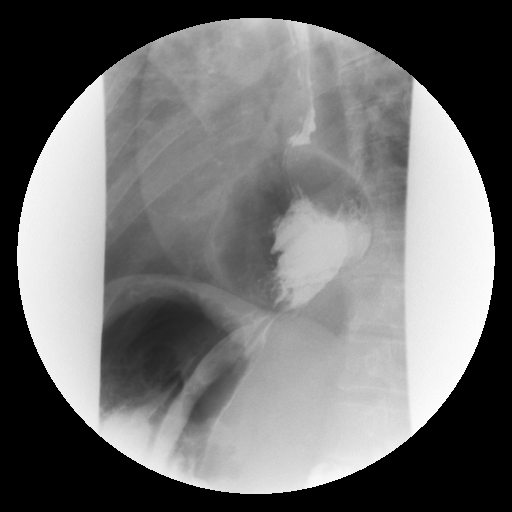

[Series 9: run · 1 of 1 slices shown (8 of 8)]
[im 1/1]
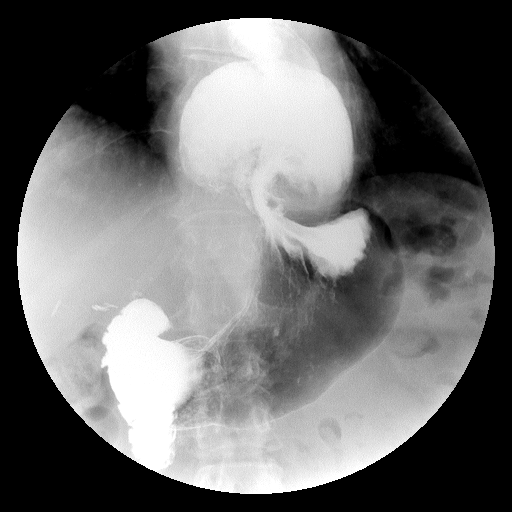

[19 of 24 positions shown; findings below may reference images not displayed]

FINDINGS: The oropharyngeal swallowing mechanisms are normal. There are
multiple tertiary contractions in the mid and distal esophagus.
There is no stricture or mass or esophagitis.

The patient has an 11 cm hiatal hernia. A 13 mm barium tablet passed
immediately from the mouth to the stomach with no delay.
IMPRESSION: 1. Large hiatal hernia, at least 11 cm. The entire fundus of the
stomach is in the chest.
2. Intermittent tertiary contractions in the mid and distal
esophagus. No stricture or visible esophagitis.

## 2016-11-11 DIAGNOSIS — R69 Illness, unspecified: Secondary | ICD-10-CM | POA: Diagnosis not present

## 2016-11-11 DIAGNOSIS — I10 Essential (primary) hypertension: Secondary | ICD-10-CM | POA: Diagnosis not present

## 2016-11-11 DIAGNOSIS — Z1389 Encounter for screening for other disorder: Secondary | ICD-10-CM | POA: Diagnosis not present

## 2016-11-11 DIAGNOSIS — Z0000 Encounter for general adult medical examination without abnormal findings: Secondary | ICD-10-CM | POA: Diagnosis not present

## 2016-11-11 DIAGNOSIS — K219 Gastro-esophageal reflux disease without esophagitis: Secondary | ICD-10-CM | POA: Diagnosis not present

## 2017-03-24 DIAGNOSIS — R69 Illness, unspecified: Secondary | ICD-10-CM | POA: Diagnosis not present

## 2017-05-12 DIAGNOSIS — M79602 Pain in left arm: Secondary | ICD-10-CM | POA: Diagnosis not present

## 2017-05-12 DIAGNOSIS — K219 Gastro-esophageal reflux disease without esophagitis: Secondary | ICD-10-CM | POA: Diagnosis not present

## 2017-05-12 DIAGNOSIS — M79601 Pain in right arm: Secondary | ICD-10-CM | POA: Diagnosis not present

## 2017-05-12 DIAGNOSIS — Z23 Encounter for immunization: Secondary | ICD-10-CM | POA: Diagnosis not present

## 2017-05-12 DIAGNOSIS — I10 Essential (primary) hypertension: Secondary | ICD-10-CM | POA: Diagnosis not present

## 2017-07-14 DIAGNOSIS — Z1231 Encounter for screening mammogram for malignant neoplasm of breast: Secondary | ICD-10-CM | POA: Diagnosis not present

## 2017-07-23 DIAGNOSIS — H66002 Acute suppurative otitis media without spontaneous rupture of ear drum, left ear: Secondary | ICD-10-CM | POA: Diagnosis not present

## 2017-08-18 DIAGNOSIS — R69 Illness, unspecified: Secondary | ICD-10-CM | POA: Diagnosis not present

## 2017-09-17 DIAGNOSIS — R109 Unspecified abdominal pain: Secondary | ICD-10-CM | POA: Diagnosis not present

## 2017-09-17 DIAGNOSIS — N3946 Mixed incontinence: Secondary | ICD-10-CM | POA: Diagnosis not present

## 2017-09-23 ENCOUNTER — Other Ambulatory Visit: Payer: Self-pay

## 2017-09-23 DIAGNOSIS — D485 Neoplasm of uncertain behavior of skin: Secondary | ICD-10-CM | POA: Diagnosis not present

## 2017-09-23 DIAGNOSIS — D225 Melanocytic nevi of trunk: Secondary | ICD-10-CM | POA: Diagnosis not present

## 2017-09-23 DIAGNOSIS — C44722 Squamous cell carcinoma of skin of right lower limb, including hip: Secondary | ICD-10-CM | POA: Diagnosis not present

## 2017-09-23 DIAGNOSIS — L570 Actinic keratosis: Secondary | ICD-10-CM | POA: Diagnosis not present

## 2017-09-23 DIAGNOSIS — C4492 Squamous cell carcinoma of skin, unspecified: Secondary | ICD-10-CM

## 2017-09-23 DIAGNOSIS — B078 Other viral warts: Secondary | ICD-10-CM | POA: Diagnosis not present

## 2017-09-23 DIAGNOSIS — D2272 Melanocytic nevi of left lower limb, including hip: Secondary | ICD-10-CM | POA: Diagnosis not present

## 2017-09-23 HISTORY — DX: Squamous cell carcinoma of skin, unspecified: C44.92

## 2017-10-04 DIAGNOSIS — H5203 Hypermetropia, bilateral: Secondary | ICD-10-CM | POA: Diagnosis not present

## 2017-10-06 DIAGNOSIS — M8588 Other specified disorders of bone density and structure, other site: Secondary | ICD-10-CM | POA: Diagnosis not present

## 2017-10-28 DIAGNOSIS — C44722 Squamous cell carcinoma of skin of right lower limb, including hip: Secondary | ICD-10-CM | POA: Diagnosis not present

## 2017-12-07 DIAGNOSIS — H9313 Tinnitus, bilateral: Secondary | ICD-10-CM | POA: Diagnosis not present

## 2017-12-07 DIAGNOSIS — M4696 Unspecified inflammatory spondylopathy, lumbar region: Secondary | ICD-10-CM | POA: Diagnosis not present

## 2017-12-07 DIAGNOSIS — Z0000 Encounter for general adult medical examination without abnormal findings: Secondary | ICD-10-CM | POA: Diagnosis not present

## 2017-12-07 DIAGNOSIS — E7800 Pure hypercholesterolemia, unspecified: Secondary | ICD-10-CM | POA: Diagnosis not present

## 2017-12-07 DIAGNOSIS — M8580 Other specified disorders of bone density and structure, unspecified site: Secondary | ICD-10-CM | POA: Diagnosis not present

## 2017-12-07 DIAGNOSIS — K219 Gastro-esophageal reflux disease without esophagitis: Secondary | ICD-10-CM | POA: Diagnosis not present

## 2017-12-07 DIAGNOSIS — I10 Essential (primary) hypertension: Secondary | ICD-10-CM | POA: Diagnosis not present

## 2017-12-07 DIAGNOSIS — Z1389 Encounter for screening for other disorder: Secondary | ICD-10-CM | POA: Diagnosis not present

## 2018-03-28 DIAGNOSIS — M545 Low back pain: Secondary | ICD-10-CM | POA: Diagnosis not present

## 2018-04-20 DIAGNOSIS — R69 Illness, unspecified: Secondary | ICD-10-CM | POA: Diagnosis not present

## 2018-04-27 DIAGNOSIS — R69 Illness, unspecified: Secondary | ICD-10-CM | POA: Diagnosis not present

## 2018-04-28 ENCOUNTER — Ambulatory Visit: Payer: Medicare HMO | Attending: Orthopaedic Surgery | Admitting: Physical Therapy

## 2018-04-28 ENCOUNTER — Encounter: Payer: Self-pay | Admitting: Physical Therapy

## 2018-04-28 ENCOUNTER — Other Ambulatory Visit: Payer: Self-pay

## 2018-04-28 DIAGNOSIS — M6281 Muscle weakness (generalized): Secondary | ICD-10-CM

## 2018-04-28 DIAGNOSIS — G8929 Other chronic pain: Secondary | ICD-10-CM | POA: Diagnosis not present

## 2018-04-28 DIAGNOSIS — M545 Low back pain: Secondary | ICD-10-CM | POA: Diagnosis not present

## 2018-04-28 NOTE — Therapy (Signed)
[facility] [place], [place], [address] Phone: [phone]   Fax:  [phone]  Physical Therapy Evaluation  Patient Details  Name: Julie Day MRN: [id number] Date of Birth: 03-Mar-1946 Referring Provider (PT): Melrose Nakayama, MD   Encounter Date: 04/28/2018  PT End of Session - 04/28/18 2210    Visit Number  1    Number of Visits  12    Date for PT Re-Evaluation  06/16/18    Authorization Type  Progress note every 10th visit    PT Start Time  1515    PT Stop Time  1600    PT Time Calculation (min)  45 min    Activity Tolerance  Patient tolerated treatment well    Behavior During Therapy  [facility] for tasks assessed/performed       Past Medical History:  Diagnosis Date  . Allergic rhinitis   . Chronic cystitis   . Degenerative disc disease   . GERD (gastroesophageal reflux disease)   . Hemangioma of liver   . History of hiatal hernia   . Hypertension     Past Surgical History:  Procedure Laterality Date  . ABDOMINAL HYSTERECTOMY    . CHOLECYSTECTOMY    . COLON RESECTION N/A 05/31/2015   Procedure: LAPAROSCOPIC REPAIR OF HIATAL HERNIA  AND LAPAROSCOPIC NISSEN FUNDOPLICATION;  Surgeon: Fanny Skates, MD;  Location: [place];  Service: General;  Laterality: N/A;  . ESOPHAGEAL MANOMETRY N/A 02/27/2015   Procedure: ESOPHAGEAL MANOMETRY (EM);  Surgeon: Garlan Fair, MD;  Location: [facility];  Service: Endoscopy;  Laterality: N/A;  . ESOPHAGOGASTRODUODENOSCOPY (EGD) WITH PROPOFOL N/A 02/26/2015   Procedure: ESOPHAGOGASTRODUODENOSCOPY (EGD) WITH PROPOFOL;  Surgeon: Garlan Fair, MD;  Location: [facility];  Service: Endoscopy;  Laterality: N/A;  . HIATAL HERNIA REPAIR  05/31/2015    There were no vitals filed for this visit.   Subjective Assessment - 04/28/18 2200    Subjective  Patient arrives to physical therapy with reports of right sided low back pain that progressively gotten worse over the past 4-6  months. Patient reported pain with household chores such as vacuuming and mopping. Patient reported she had an injection which helped but didn't alleviate all pain. Patient reports a "grabbing sensation" on the right side. Patient reports difficulties with standing and sitting. Patient's pain at worst is 7/10 and pain at best is 2/10 with ice and walking. Patient reports pain becomes severe at night that it wakes her up 4-5x per night. Patient's goals are to decrease pain, improve ability to perform house hold duties, and to sleep longer throughout the night.    Pertinent History  HTN    Limitations  Sitting;Standing    How long can you sit comfortably?  30-45 minutes    How long can you stand comfortably?  45 minutes    Diagnostic tests  x-ray    Patient Stated Goals  improve ability to perform home duties    Currently in Pain?  Yes    Pain Score  6     Pain Location  Back    Pain Orientation  Right;Lower    Pain Descriptors / Indicators  Aching    Pain Type  Chronic pain    Pain Onset  More than a month ago    Pain Frequency  Constant    Aggravating Factors   sitting standing in one place for long periods of time    Pain Relieving Factors  ice walking    Effect of  Pain on Daily Activities  difficult to perform home activities.         [facility] PT Assessment - 04/28/18 0001      Assessment   Medical Diagnosis  Low back pain    Referring Provider (PT)  Melrose Nakayama, MD    Onset Date/Surgical Date  --   ongoing   Next MD Visit  May 06, 2018    Prior Therapy  yes      Precautions   Precautions  None      Restrictions   Weight Bearing Restrictions  No      Balance Screen   Has the patient fallen in the past 6 months  No    Has the patient had a decrease in activity level because of a fear of falling?   No    Is the patient reluctant to leave their home because of a fear of falling?   No      Home Environment   Living Environment  Private residence    Living Arrangements   Spouse/significant other      Prior Function   Level of Independence  Independent      Posture/Postural Control   Posture/Postural Control  Postural limitations    Postural Limitations  Increased lumbar lordosis;Forward head;Rounded Shoulders      ROM / Strength   AROM / PROM / Strength  Strength      Strength   Strength Assessment Site  Hip;Knee    Right/Left Hip  Right;Left    Right Hip Flexion  4-/5    Right Hip Extension  3/5    Right Hip ABduction  4-/5    Left Hip Flexion  4/5    Left Hip Extension  3+/5    Left Hip ABduction  4-/5    Right/Left Knee  Right;Left    Right Knee Flexion  4/5    Right Knee Extension  4+/5    Left Knee Flexion  4/5    Left Knee Extension  4+/5      Flexibility   Soft Tissue Assessment /Muscle Length  yes    Hamstrings  WNL      Palpation   SI assessment   unequal leg lengths R>L    Palpation comment  Minimal tenderness to palpation at R SIJ      Special Tests    Special Tests  Sacrolliac Tests    Sacroiliac Tests   Sacral Compression      Sacral Compression   Findings  Positive      Transfers   Transfers  Independent with all Transfers      Ambulation/Gait   Gait Pattern  Within Functional Limits                Objective measurements completed on examination: See above findings.              PT Education - 04/28/18 2209    Education Details  bridges, hip adduction, sktc    Person(s) Educated  Patient    Methods  Explanation;Demonstration;Handout    Comprehension  Verbalized understanding;Returned demonstration          PT Long Term Goals - 04/28/18 2213      PT LONG TERM GOAL #1   Title  Patient will be independent with HEP    Time  6    Period  Weeks    Status  New      PT LONG TERM GOAL #2   Title  Patient will demonstrate 4+/5 or greater bilateral LE strength to improve stability during functional tasks.    Time  6    Period  Weeks    Status  New      PT LONG TERM GOAL #3   Title   Patient will report ability to perform household chores with low back pain less than 4/10.    Time  6    Period  Weeks    Status  New      PT LONG TERM GOAL #4   Title  Patient will report ability to sleep throughout the night with pain less than 3/10 in order to get adequate rest.    Time  6    Period  Weeks    Status  New             Plan - 04/28/18 2211    Clinical Impression Statement  Patient is a 71 year old female who presents to physical therapy with right sided low back pain and decreased LE strength. Patient reports minimal tenderness to palpation along R SIJ. Patient noted with unequal leg lengths, R LE> L LE. Patient ambulates with a normal gait pattern. Patient would benefit from skilled physical therapy to address deficits and address patient's goals.    Clinical Presentation  Stable    Clinical Decision Making  Low    Rehab Potential  Good    PT Frequency  2x / week    PT Duration  6 weeks    PT Treatment/Interventions  ADLs/Self Care Home Management;Iontophoresis 4mg /ml Dexamethasone;Neuromuscular re-education;Passive range of motion;Manual techniques;Traction;Ultrasound;Cryotherapy;Electrical Stimulation;Therapeutic activities;Therapeutic exercise;Functional mobility training;Patient/family education;Taping;Balance training    PT Next Visit Plan  Nustep, LE strengthening, core stability, e-stim and ice (can try heat) PRN for pain relief    PT Home Exercise Plan  see patient education section    Consulted and Agree with Plan of Care  Patient       Patient will benefit from skilled therapeutic intervention in order to improve the following deficits and impairments:  Pain, Postural dysfunction, Decreased activity tolerance, Decreased endurance, Decreased strength  Visit Diagnosis: Chronic right-sided low back pain, with sciatica presence unspecified - Plan: PT plan of care cert/re-cert  Muscle weakness (generalized) - Plan: PT plan of care  cert/re-cert     Problem List Patient Active Problem List   Diagnosis Date Noted  . 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 [place], [place], [address] Phone: [phone]   Fax:  [phone]  Name: Julie Day MRN: [id number] Date of Birth: 11-Jun-1946

## 2018-05-02 ENCOUNTER — Encounter: Payer: Self-pay | Admitting: Physical Therapy

## 2018-05-02 ENCOUNTER — Ambulatory Visit: Payer: Medicare HMO | Admitting: Physical Therapy

## 2018-05-02 DIAGNOSIS — M6281 Muscle weakness (generalized): Secondary | ICD-10-CM | POA: Diagnosis not present

## 2018-05-02 DIAGNOSIS — M545 Low back pain: Principal | ICD-10-CM

## 2018-05-02 DIAGNOSIS — G8929 Other chronic pain: Secondary | ICD-10-CM | POA: Diagnosis not present

## 2018-05-02 NOTE — Therapy (Signed)
[facility] [place], [place], [address] Phone: [phone]   Fax:  [phone]  Physical Therapy Treatment  Patient Details  Name: Julie Day MRN: [id number] Date of Birth: May 23, 1946 Referring Provider (PT): Melrose Nakayama, MD   Encounter Date: 05/02/2018  PT End of Session - 05/02/18 1121    Visit Number  2    Number of Visits  12    Date for PT Re-Evaluation  06/16/18    Authorization Type  Progress note every 10th visit    PT Start Time  1115    PT Stop Time  1203    PT Time Calculation (min)  48 min    Activity Tolerance  Patient tolerated treatment well    Behavior During Therapy  [facility] for tasks assessed/performed       Past Medical History:  Diagnosis Date  . Allergic rhinitis   . Chronic cystitis   . Degenerative disc disease   . GERD (gastroesophageal reflux disease)   . Hemangioma of liver   . History of hiatal hernia   . Hypertension     Past Surgical History:  Procedure Laterality Date  . ABDOMINAL HYSTERECTOMY    . CHOLECYSTECTOMY    . COLON RESECTION N/A 05/31/2015   Procedure: LAPAROSCOPIC REPAIR OF HIATAL HERNIA  AND LAPAROSCOPIC NISSEN FUNDOPLICATION;  Surgeon: Fanny Skates, MD;  Location: [place];  Service: General;  Laterality: N/A;  . ESOPHAGEAL MANOMETRY N/A 02/27/2015   Procedure: ESOPHAGEAL MANOMETRY (EM);  Surgeon: Garlan Fair, MD;  Location: [facility];  Service: Endoscopy;  Laterality: N/A;  . ESOPHAGOGASTRODUODENOSCOPY (EGD) WITH PROPOFOL N/A 02/26/2015   Procedure: ESOPHAGOGASTRODUODENOSCOPY (EGD) WITH PROPOFOL;  Surgeon: Garlan Fair, MD;  Location: [facility];  Service: Endoscopy;  Laterality: N/A;  . HIATAL HERNIA REPAIR  05/31/2015    There were no vitals filed for this visit.  Subjective Assessment - 05/02/18 1118    Subjective  Patient reports right sided low back pain. She reports she has been complaint with HEP.    Pertinent History  HTN    Limitations   Sitting;Standing    How long can you sit comfortably?  30-45 minutes    How long can you stand comfortably?  45 minutes    Diagnostic tests  x-ray    Patient Stated Goals  improve ability to perform home duties    Currently in Pain?  Yes    Pain Score  3     Pain Location  Back    Pain Orientation  Right;Lower    Pain Descriptors / Indicators  Aching    Pain Type  Chronic pain    Pain Onset  More than a month ago    Pain Frequency  Constant         [facility] PT Assessment - 05/02/18 0001      Assessment   Medical Diagnosis  Low back pain    Next MD Visit  May 06, 2018    Prior Therapy  yes                   [facility] PT Treatment/Exercise - 05/02/18 0001      Self-Care   Self-Care  ADL's    ADL's  review of body mechanics for household chores      Exercises   Exercises  Lumbar      Lumbar Exercises: Stretches   Lower Trunk Rotation  3 reps;30 seconds      Lumbar Exercises: Standing  Heel Raises  20 reps    Row  Strengthening;Both;20 reps    Row Limitations  Pink XTS    Shoulder Extension  Strengthening;20 reps    Shoulder Extension Limitations  Pink XTS    Other Standing Lumbar Exercises  lateral stepping at countertop x2 minutes      Lumbar Exercises: Supine   Clam  20 reps   yellow theraband   Bent Knee Raise  20 reps    Bridge  20 reps;Compliant      Modalities   Modalities  --             PT Education - 05/02/18 1209    Education Details  body mechanics for vacuuming, sweeping, and for sink activities    Person(s) Educated  Patient    Methods  Explanation;Demonstration;Handout    Comprehension  Verbalized understanding          PT Long Term Goals - 04/28/18 2213      PT LONG TERM GOAL #1   Title  Patient will be independent with HEP    Time  6    Period  Weeks    Status  New      PT LONG TERM GOAL #2   Title  Patient will demonstrate 4+/5 or greater bilateral LE strength to improve stability during functional tasks.     Time  6    Period  Weeks    Status  New      PT LONG TERM GOAL #3   Title  Patient will report ability to perform household chores with low back pain less than 4/10.    Time  6    Period  Weeks    Status  New      PT LONG TERM GOAL #4   Title  Patient will report ability to sleep throughout the night with pain less than 3/10 in order to get adequate rest.    Time  6    Period  Weeks    Status  New            Plan - 05/02/18 1147    Clinical Impression Statement  Patient was able to complete treatment well with minimal reports of pain. Patient was able to demonstrate good form with all exercises after demonstration. Patient and PT reviewed HEP; patient was able to demonstrate with good form, just required intermittent cues for repetitions. Patient inquired when to return to MD; patient provided with certification end date and if she would like return to MD, to do so around that date. Patient reported agreement. Patient and PT reviewed body mechanics for household chores. Patient provided with handout. Patient reported understanding.     Clinical Presentation  Stable    Clinical Decision Making  Low    Rehab Potential  Good    PT Frequency  2x / week    PT Duration  6 weeks    PT Treatment/Interventions  ADLs/Self Care Home Management;Iontophoresis 4mg /ml Dexamethasone;Neuromuscular re-education;Passive range of motion;Manual techniques;Traction;Ultrasound;Cryotherapy;Electrical Stimulation;Therapeutic activities;Therapeutic exercise;Functional mobility training;Patient/family education;Taping;Balance training    PT Next Visit Plan  Nustep, LE strengthening, core stability, e-stim and ice (can try heat) PRN for pain relief    Consulted and Agree with Plan of Care  Patient       Patient will benefit from skilled therapeutic intervention in order to improve the following deficits and impairments:  Pain, Postural dysfunction, Decreased activity tolerance, Decreased endurance, Decreased  strength  Visit Diagnosis: Chronic right-sided low back pain,  with sciatica presence unspecified  Muscle weakness (generalized)     Problem List Patient Active Problem List   Diagnosis Date Noted  . 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 [place], [place], [address] Phone: [phone]   Fax:  [phone]  Name: Jordon Bourquin MRN: [id number] Date of Birth: 12-Apr-1946

## 2018-05-05 ENCOUNTER — Ambulatory Visit: Payer: Medicare HMO | Attending: Orthopaedic Surgery | Admitting: Physical Therapy

## 2018-05-05 DIAGNOSIS — G8929 Other chronic pain: Secondary | ICD-10-CM | POA: Insufficient documentation

## 2018-05-05 DIAGNOSIS — M6281 Muscle weakness (generalized): Secondary | ICD-10-CM

## 2018-05-05 DIAGNOSIS — M545 Low back pain: Secondary | ICD-10-CM | POA: Diagnosis not present

## 2018-05-05 NOTE — Therapy (Signed)
[facility] [place], [place], [address] Phone: [phone]   Fax:  [phone]  Physical Therapy Treatment  Patient Details  Name: Julie Day MRN: [id number] Date of Birth: 01-Aug-1946 Referring Provider (PT): Melrose Nakayama, MD   Encounter Date: 05/05/2018  PT End of Session - 05/05/18 1607    Visit Number  3    Number of Visits  12    Date for PT Re-Evaluation  06/16/18    Authorization Type  Progress note every 10th visit    PT Start Time  0320    PT Stop Time  0405    PT Time Calculation (min)  45 min    Activity Tolerance  Patient tolerated treatment well    Behavior During Therapy  [facility] for tasks assessed/performed       Past Medical History:  Diagnosis Date  . Allergic rhinitis   . Chronic cystitis   . Degenerative disc disease   . GERD (gastroesophageal reflux disease)   . Hemangioma of liver   . History of hiatal hernia   . Hypertension     Past Surgical History:  Procedure Laterality Date  . ABDOMINAL HYSTERECTOMY    . CHOLECYSTECTOMY    . COLON RESECTION N/A 05/31/2015   Procedure: LAPAROSCOPIC REPAIR OF HIATAL HERNIA  AND LAPAROSCOPIC NISSEN FUNDOPLICATION;  Surgeon: Fanny Skates, MD;  Location: [facility];  Service: General;  Laterality: N/A;  . ESOPHAGEAL MANOMETRY N/A 02/27/2015   Procedure: ESOPHAGEAL MANOMETRY (EM);  Surgeon: Garlan Fair, MD;  Location: [facility];  Service: Endoscopy;  Laterality: N/A;  . ESOPHAGOGASTRODUODENOSCOPY (EGD) WITH PROPOFOL N/A 02/26/2015   Procedure: ESOPHAGOGASTRODUODENOSCOPY (EGD) WITH PROPOFOL;  Surgeon: Garlan Fair, MD;  Location: [facility];  Service: Endoscopy;  Laterality: N/A;  . HIATAL HERNIA REPAIR  05/31/2015    There were no vitals filed for this visit.  Subjective Assessment - 05/05/18 1537    Subjective  Pt relays her back is getting better but she was in [employer] earlier today and she must have turned wrong or something but she felt a  catch in her Rt low back.     Currently in Pain?  Yes    Pain Score  2     Pain Location  Back    Pain Orientation  Right;Lower    Pain Descriptors / Indicators  Aching                       [facility] Adult PT Treatment/Exercise - 05/05/18 0001      Exercises   Exercises  Lumbar      Lumbar Exercises: Stretches   Lower Trunk Rotation  3 reps;30 seconds    Other Lumbar Stretch Exercise  supine feet on ball, Knees to chest with PT overpressure into Lt rotation to open Rt lower lumbar facets, 5 sec X 15      Lumbar Exercises: Aerobic   Nustep  10 min       Lumbar Exercises: Standing   Row  Strengthening;Both;20 reps    Row Limitations  Pink XTS    Shoulder Extension  Strengthening;20 reps    Shoulder Extension Limitations  Pink XTS    Other Standing Lumbar Exercises  lateral stepping at countertop x2 minutes      Lumbar Exercises: Supine   Clam  20 reps   red   Bent Knee Raise  20 reps    Bridge  20 reps;Compliant      Manual Therapy  Manual therapy comments  pt supine long axis distraction Rt LE sustained 30 sec X 5 reps, then pt supine hip flexed to 90 deg and adducted and posterior hip mobs grade 2  30 sec X 5.              PT Education - 05/05/18 1606    Education Details  rationale for MT for hip/lumbar decompression    Person(s) Educated  Patient    Methods  Explanation    Comprehension  Verbalized understanding          PT Long Term Goals - 04/28/18 2213      PT LONG TERM GOAL #1   Title  Patient will be independent with HEP    Time  6    Period  Weeks    Status  New      PT LONG TERM GOAL #2   Title  Patient will demonstrate 4+/5 or greater bilateral LE strength to improve stability during functional tasks.    Time  6    Period  Weeks    Status  New      PT LONG TERM GOAL #3   Title  Patient will report ability to perform household chores with low back pain less than 4/10.    Time  6    Period  Weeks    Status  New      PT  LONG TERM GOAL #4   Title  Patient will report ability to sleep throughout the night with pain less than 3/10 in order to get adequate rest.    Time  6    Period  Weeks    Status  New            Plan - 05/05/18 1608    Clinical Impression Statement  Pt is making good progress with PT with less pain, and improved strength and improved activity tolerance. She does still have a catching pain located near Rt SI joint. She was trialed with MT for LAD, posterior hip mobs, and Rt lower lumbar facet gapping. She relays this helped improve symptoms post sesison.     PT Frequency  2x / week    PT Duration  6 weeks    PT Treatment/Interventions  ADLs/Self Care Home Management;Iontophoresis 4mg /ml Dexamethasone;Neuromuscular re-education;Passive range of motion;Manual techniques;Traction;Ultrasound;Cryotherapy;Electrical Stimulation;Therapeutic activities;Therapeutic exercise;Functional mobility training;Patient/family education;Taping;Balance training    PT Next Visit Plan  assess response to MT from last visit, Nustep, LE strengthening, core stability, e-stim and ice (can try heat) PRN for pain relief    PT Home Exercise Plan  see patient education section    Consulted and Agree with Plan of Care  Patient       Patient will benefit from skilled therapeutic intervention in order to improve the following deficits and impairments:  Pain, Postural dysfunction, Decreased activity tolerance, Decreased endurance, Decreased strength  Visit Diagnosis: Muscle weakness (generalized)  Chronic right-sided low back pain without sciatica     Problem List Patient Active Problem List   Diagnosis Date Noted  . 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 [place], [place], [address] Phone: [phone]   Fax:  [phone]  Name: Julie Day MRN: [id number] Date  of Birth: 02-Feb-1946

## 2018-05-06 DIAGNOSIS — R69 Illness, unspecified: Secondary | ICD-10-CM | POA: Diagnosis not present

## 2018-05-10 ENCOUNTER — Encounter: Payer: Medicare HMO | Admitting: *Deleted

## 2018-05-12 ENCOUNTER — Encounter: Payer: Medicare HMO | Admitting: Physical Therapy

## 2018-05-16 ENCOUNTER — Ambulatory Visit: Payer: Medicare HMO | Admitting: Physical Therapy

## 2018-05-16 DIAGNOSIS — G8929 Other chronic pain: Secondary | ICD-10-CM | POA: Diagnosis not present

## 2018-05-16 DIAGNOSIS — M6281 Muscle weakness (generalized): Secondary | ICD-10-CM

## 2018-05-16 DIAGNOSIS — M545 Low back pain: Secondary | ICD-10-CM

## 2018-05-16 NOTE — Therapy (Signed)
[facility] [place], [place], [address] Phone: [phone]   Fax:  [phone]  Physical Therapy Treatment  Patient Details  Name: Julie Day MRN: [id number] Date of Birth: [birth year] Referring Provider (PT): Melrose Nakayama, MD   Encounter Date: 05/16/2018  PT End of Session - 05/16/18 1126    Visit Number  4    Number of Visits  12    Date for PT Re-Evaluation  06/16/18    Authorization Type  Progress note every 10th visit    PT Start Time  1115   10 mintutes for knee assessment   PT Stop Time  1201    PT Time Calculation (min)  46 min    Activity Tolerance  Patient tolerated treatment well    Behavior During Therapy  [facility] for tasks assessed/performed       Past Medical History:  Diagnosis Date  . Allergic rhinitis   . Chronic cystitis   . Degenerative disc disease   . GERD (gastroesophageal reflux disease)   . Hemangioma of liver   . History of hiatal hernia   . Hypertension     Past Surgical History:  Procedure Laterality Date  . ABDOMINAL HYSTERECTOMY    . CHOLECYSTECTOMY    . COLON RESECTION N/A 05/31/2015   Procedure: LAPAROSCOPIC REPAIR OF HIATAL HERNIA  AND LAPAROSCOPIC NISSEN FUNDOPLICATION;  Surgeon: Fanny Skates, MD;  Location: [facility];  Service: General;  Laterality: N/A;  . ESOPHAGEAL MANOMETRY N/A 02/27/2015   Procedure: ESOPHAGEAL MANOMETRY (EM);  Surgeon: Garlan Fair, MD;  [facility]: [facility];  Service: Endoscopy;  Laterality: N/A;  . ESOPHAGOGASTRODUODENOSCOPY (EGD) WITH PROPOFOL N/A 02/26/2015   Procedure: ESOPHAGOGASTRODUODENOSCOPY (EGD) WITH PROPOFOL;  Surgeon: Garlan Fair, MD;  Location: [facility];  Service: Endoscopy;  Laterality: N/A;  . HIATAL HERNIA REPAIR  05/31/2015    There were no vitals filed for this visit.  Subjective Assessment - 05/16/18 1129    Subjective  Patient reports low back is feeling better since start of therapy but reports her right knee has  been in pain since long axis distraction and hip mobilization. Pain in back is no more than a 2/10    Pertinent History  HTN    Limitations  Sitting;Standing    How long can you sit comfortably?  30-45 minutes    How long can you stand comfortably?  45 minutes    Diagnostic tests  x-ray    Patient Stated Goals  improve ability to perform home duties    Pain Score  2     Pain Location  Back    Pain Orientation  Right    Pain Descriptors / Indicators  Aching    Pain Type  Chronic pain    Pain Onset  More than a month ago    Pain Frequency  Constant         [facility] PT Assessment - 05/16/18 0001      Assessment   Medical Diagnosis  Low back pain    Next MD Visit  MD after therapy      Precautions   Precautions  None                   [facility] Adult PT Treatment/Exercise - 05/16/18 0001      Exercises   Exercises  Lumbar      Lumbar Exercises: Stretches   Single Knee to Chest Stretch  Right;30 seconds;3 reps    Lower Trunk Rotation  3 reps;30 seconds    Other Lumbar Stretch Exercise  calf stretch on rockerboard 3x30"      Lumbar Exercises: Standing   Row  Strengthening;Both;20 reps    Row Limitations  Pink XTS    Shoulder Extension  Strengthening;20 reps    Shoulder Extension Limitations  Pink XTS    Other Standing Lumbar Exercises  hip abduction x20 each    Other Standing Lumbar Exercises  wood chop x10 each Pink XTS      Lumbar Exercises: Supine   Clam  20 reps   red   Bent Knee Raise  20 reps    Bridge  20 reps;Compliant    Straight Leg Raise  10 reps                  PT Long Term Goals - 04/28/18 2213      PT LONG TERM GOAL #1   Title  Patient will be independent with HEP    Time  6    Period  Weeks    Status  New      PT LONG TERM GOAL #2   Title  Patient will demonstrate 4+/5 or greater bilateral LE strength to improve stability during functional tasks.    Time  6    Period  Weeks    Status  New      PT LONG TERM GOAL #3   Title   Patient will report ability to perform household chores with low back pain less than 4/10.    Time  6    Period  Weeks    Status  New      PT LONG TERM GOAL #4   Title  Patient will report ability to sleep throughout the night with pain less than 3/10 in order to get adequate rest.    Time  6    Period  Weeks    Status  New            Plan - 05/16/18 1133    Clinical Impression Statement  Prior to start of therapeutic exercises, patient's right knee was assessed. Patient noted with increased muscle tone in proximal calf muscles and noted with tenderness to palpation to medial joint line. Patient (-) for valgus test. Patient requested not to perform long axis distraction or perform nustep secondary to knee pain. Patient was able to perform lumbar spine exercises with minimal reports of back pain but stated some right knee discomfort with standing hip abduction. Patient's exercises were performed in supine for remaining of session to decrease knee discomfort. Patient was able to complete remaining exercises with minimal lumbar pain.    Clinical Presentation  Stable    Clinical Decision Making  Low    Rehab Potential  Good    PT Frequency  2x / week    PT Duration  6 weeks    PT Treatment/Interventions  ADLs/Self Care Home Management;Iontophoresis 4mg /ml Dexamethasone;Neuromuscular re-education;Passive range of motion;Manual techniques;Traction;Ultrasound;Cryotherapy;Electrical Stimulation;Therapeutic activities;Therapeutic exercise;Functional mobility training;Patient/family education;Taping;Balance training    PT Next Visit Plan  assess knee pain, if tolerable, Nustep, LE strengthening, core stability, e-stim and ice (can try heat) PRN for pain relief    Consulted and Agree with Plan of Care  Patient       Patient will benefit from skilled therapeutic intervention in order to improve the following deficits and impairments:  Pain, Postural dysfunction, Decreased activity tolerance,  Decreased endurance, Decreased strength  Visit Diagnosis: Muscle weakness (generalized)  Chronic right-sided low  back pain without sciatica     Problem List Patient Active Problem List   Diagnosis Date Noted  . 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 [place], [place], [address] Phone: [phone]   Fax:  [phone]  Name: Gloriajean Okun MRN: [id number] Date of Birth: Sep 08, 1945

## 2018-05-19 ENCOUNTER — Encounter: Payer: Self-pay | Admitting: Physical Therapy

## 2018-05-19 ENCOUNTER — Ambulatory Visit: Payer: Medicare HMO | Admitting: Physical Therapy

## 2018-05-19 DIAGNOSIS — G8929 Other chronic pain: Secondary | ICD-10-CM | POA: Diagnosis not present

## 2018-05-19 DIAGNOSIS — M6281 Muscle weakness (generalized): Secondary | ICD-10-CM

## 2018-05-19 DIAGNOSIS — M545 Low back pain: Secondary | ICD-10-CM

## 2018-05-19 NOTE — Therapy (Signed)
[facility] [place], [place], [address] Phone: [phone]   Fax:  [phone]  Physical Therapy Treatment  Patient Details  Name: Julie Day MRN: [id number] Date of Birth: 1945-12-04 Referring Provider (PT): Melrose Nakayama, MD   Encounter Date: 05/19/2018  PT End of Session - 05/19/18 2127    Visit Number  5    Number of Visits  12    Date for PT Re-Evaluation  06/16/18    Authorization Type  Progress note every 10th visit    PT Start Time  1515   15 minutes spent to assess right knee   PT Stop Time  1602    PT Time Calculation (min)  47 min    Activity Tolerance  Patient tolerated treatment well    Behavior During Therapy  [facility] for tasks assessed/performed       Past Medical History:  Diagnosis Date  . Allergic rhinitis   . Chronic cystitis   . Degenerative disc disease   . GERD (gastroesophageal reflux disease)   . Hemangioma of liver   . History of hiatal hernia   . Hypertension     Past Surgical History:  Procedure Laterality Date  . ABDOMINAL HYSTERECTOMY    . CHOLECYSTECTOMY    . COLON RESECTION N/A 05/31/2015   Procedure: LAPAROSCOPIC REPAIR OF HIATAL HERNIA  AND LAPAROSCOPIC NISSEN FUNDOPLICATION;  Surgeon: Fanny Skates, MD;  Location: [facility];  Service: General;  Laterality: N/A;  . ESOPHAGEAL MANOMETRY N/A 02/27/2015   Procedure: ESOPHAGEAL MANOMETRY (EM);  Surgeon: Garlan Fair, MD;  [facility]: [facility];  Service: Endoscopy;  Laterality: N/A;  . ESOPHAGOGASTRODUODENOSCOPY (EGD) WITH PROPOFOL N/A 02/26/2015   Procedure: ESOPHAGOGASTRODUODENOSCOPY (EGD) WITH PROPOFOL;  Surgeon: Garlan Fair, MD;  [facility];  Service: Endoscopy;  Laterality: N/A;  . HIATAL HERNIA REPAIR  05/31/2015    There were no vitals filed for this visit.  Subjective Assessment - 05/19/18 1531    Subjective  Patient reported back pain is 2/10.     Pertinent History  HTN    Limitations   Sitting;Standing    How long can you sit comfortably?  30-45 minutes    How long can you stand comfortably?  45 minutes    Diagnostic tests  x-ray    Patient Stated Goals  improve ability to perform home duties    Currently in Pain?  Yes    Pain Score  2     Pain Location  Back    Pain Orientation  Right    Pain Descriptors / Indicators  Aching    Pain Type  Chronic pain    Pain Onset  More than a month ago    Pain Frequency  Constant         [facility] PT Assessment - 05/19/18 0001      Assessment   Medical Diagnosis  Low back pain    Next MD Visit  MD after therapy                   [facility] Adult PT Treatment/Exercise - 05/19/18 0001      Exercises   Exercises  Lumbar      Lumbar Exercises: Stretches   Double Knee to Chest Stretch  3 reps;30 seconds      Lumbar Exercises: Standing   Heel Raises  20 reps    Row  Strengthening;Both;20 reps    Row Limitations  Pink XTS    Shoulder Extension  Strengthening;20  reps    Shoulder Extension Limitations  Pink XTS    Other Standing Lumbar Exercises  lateral stepping x2 minutes    Other Standing Lumbar Exercises  wood chop x20 each Pink XTS      Lumbar Exercises: Supine   Bent Knee Raise  20 reps;3 seconds    Bridge  20 reps;Compliant    Straight Leg Raise  10 reps      Lumbar Exercises: Sidelying   Clam  Both;20 reps      Manual Therapy   Manual Therapy  Myofascial release    Myofascial Release  TPR to R glute to decrease tone                  PT Long Term Goals - 04/28/18 2213      PT LONG TERM GOAL #1   Title  Patient will be independent with HEP    Time  6    Period  Weeks    Status  New      PT LONG TERM GOAL #2   Title  Patient will demonstrate 4+/5 or greater bilateral LE strength to improve stability during functional tasks.    Time  6    Period  Weeks    Status  New      PT LONG TERM GOAL #3   Title  Patient will report ability to perform household chores with low back pain less  than 4/10.    Time  6    Period  Weeks    Status  New      PT LONG TERM GOAL #4   Title  Patient will report ability to sleep throughout the night with pain less than 3/10 in order to get adequate rest.    Time  6    Period  Weeks    Status  New            Plan - 05/19/18 1532    Clinical Impression Statement  Patient's knee was reassessed by Mali Applegate, PT as patient reported knee is still painful. Patient instructed to use TENS unit on knee, ice massage for 3 minutes, and add biofreeze. Patient reported understanding. Patient was able to tolerate lumbar exercises with no reports of increased pain. Patient required tactile cuing to prevent rolling back. Patient noted with decrease pain after STW and TPR to right glute.     Clinical Presentation  Stable    Clinical Decision Making  Low    Rehab Potential  Good    PT Frequency  2x / week    PT Duration  6 weeks    PT Treatment/Interventions  ADLs/Self Care Home Management;Iontophoresis 4mg /ml Dexamethasone;Neuromuscular re-education;Passive range of motion;Manual techniques;Traction;Ultrasound;Cryotherapy;Electrical Stimulation;Therapeutic activities;Therapeutic exercise;Functional mobility training;Patient/family education;Taping;Balance training    PT Next Visit Plan  assess knee pain, if tolerable, Nustep, LE strengthening, core stability, e-stim and ice (can try heat) PRN for pain relief    Consulted and Agree with Plan of Care  Patient       Patient will benefit from skilled therapeutic intervention in order to improve the following deficits and impairments:  Pain, Postural dysfunction, Decreased activity tolerance, Decreased endurance, Decreased strength  Visit Diagnosis: Muscle weakness (generalized)  Chronic right-sided low back pain without sciatica     Problem List Patient Active Problem List   Diagnosis Date Noted  . 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

## 2018-05-23 ENCOUNTER — Ambulatory Visit: Payer: Medicare HMO | Admitting: Physical Therapy

## 2018-05-23 ENCOUNTER — Encounter: Payer: Self-pay | Admitting: Physical Therapy

## 2018-05-23 DIAGNOSIS — M545 Low back pain, unspecified: Secondary | ICD-10-CM

## 2018-05-23 DIAGNOSIS — G8929 Other chronic pain: Secondary | ICD-10-CM | POA: Diagnosis not present

## 2018-05-23 DIAGNOSIS — M6281 Muscle weakness (generalized): Secondary | ICD-10-CM

## 2018-05-23 NOTE — Therapy (Signed)
[facility] [place], [place], [address] Phone: [phone]   Fax:  [phone]  Physical Therapy Treatment  Patient Details  Name: Julie Day MRN: [id number] Date of Birth: [birth year] Referring Provider (PT): Melrose Nakayama, MD   Encounter Date: 05/23/2018  PT End of Session - 05/23/18 1129    Visit Number  6    Number of Visits  12    Date for PT Re-Evaluation  06/16/18    Authorization Type  Progress note every 10th visit    PT Start Time  1115    PT Stop Time  1159    PT Time Calculation (min)  44 min    Activity Tolerance  Patient tolerated treatment well    Behavior During Therapy  [facility] for tasks assessed/performed       Past Medical History:  Diagnosis Date  . Allergic rhinitis   . Chronic cystitis   . Degenerative disc disease   . GERD (gastroesophageal reflux disease)   . Hemangioma of liver   . History of hiatal hernia   . Hypertension     Past Surgical History:  Procedure Laterality Date  . ABDOMINAL HYSTERECTOMY    . CHOLECYSTECTOMY    . COLON RESECTION N/A 05/31/2015   Procedure: LAPAROSCOPIC REPAIR OF HIATAL HERNIA  AND LAPAROSCOPIC NISSEN FUNDOPLICATION;  Surgeon: Fanny Skates, MD;  Location: [place];  Service: General;  Laterality: N/A;  . ESOPHAGEAL MANOMETRY N/A 02/27/2015   Procedure: ESOPHAGEAL MANOMETRY (EM);  Surgeon: Garlan Fair, MD;  [facility];  Service: Endoscopy;  Laterality: N/A;  . ESOPHAGOGASTRODUODENOSCOPY (EGD) WITH PROPOFOL N/A 02/26/2015   Procedure: ESOPHAGOGASTRODUODENOSCOPY (EGD) WITH PROPOFOL;  Surgeon: Garlan Fair, MD;  [facility]: [facility];  Service: Endoscopy;  Laterality: N/A;  . HIATAL HERNIA REPAIR  05/31/2015    There were no vitals filed for this visit.  Subjective Assessment - 05/23/18 1122    Subjective  Patient reports back pain has improved and states pain is at 1/10. TPR seemed to help alleviate pain.    Pertinent History  HTN     Limitations  Sitting;Standing    How long can you sit comfortably?  30-45 minutes    How long can you stand comfortably?  45 minutes    Diagnostic tests  x-ray    Patient Stated Goals  improve ability to perform home duties    Currently in Pain?  Yes    Pain Score  1     Pain Location  Back    Pain Orientation  Right    Pain Descriptors / Indicators  Aching    Pain Type  Chronic pain    Pain Onset  More than a month ago    Pain Frequency  Constant         [facility] PT Assessment - 05/23/18 0001      Assessment   Medical Diagnosis  Low back pain    Next MD Visit  MD after therapy      Strength   Right/Left Hip  Right;Left    Right Hip Flexion  4-/5    Right Hip Extension  3+/5    Right Hip ABduction  4/5    Left Hip Flexion  4/5    Left Hip Extension  3+/5    Left Hip ABduction  4/5    Right/Left Knee  Right;Left    Right Knee Flexion  4/5    Right Knee Extension  4/5    Left Knee Flexion  4/5    Left Knee Extension  4+/5                   [facility] Adult PT Treatment/Exercise - 05/23/18 0001      Exercises   Exercises  Lumbar      Lumbar Exercises: Stretches   Double Knee to Chest Stretch  3 reps;30 seconds    Figure 4 Stretch  3 reps;30 seconds      Lumbar Exercises: Standing   Row  Strengthening;Both;20 reps    Row Limitations  Pink XTS    Shoulder Extension  Strengthening;20 reps    Shoulder Extension Limitations  Pink XTS    Other Standing Lumbar Exercises  wood chop x20 each Pink XTS      Lumbar Exercises: Supine   Bridge with [employer]  20 reps;Compliant;5 seconds    Straight Leg Raise  20 reps      Lumbar Exercises: Sidelying   Clam  Both;20 reps    Hip Abduction  Both;10 reps      Manual Therapy   Manual Therapy  Myofascial release;Soft tissue mobilization    Soft tissue mobilization  STW/M to lumbar paraspinals and upper gluteals to decrease pain and tone    Myofascial Release  TPR to R glute to decrease tone                   PT Long Term Goals - 05/23/18 1129      PT LONG TERM GOAL #1   Title  Patient will be independent with HEP    Time  6    Period  Weeks    Status  Achieved      PT LONG TERM GOAL #2   Title  Patient will demonstrate 4+/5 or greater bilateral LE strength to improve stability during functional tasks.    Time  6    Period  Weeks    Status  On-going      PT LONG TERM GOAL #3   Title  Patient will report ability to perform household chores with low back pain less than 4/10.    Time  6    Period  Weeks    Status  On-going   8/10      PT LONG TERM GOAL #4   Title  Patient will report ability to sleep throughout the night with pain less than 3/10 in order to get adequate rest.    Time  6    Period  Weeks    Status  On-going   3-4/10           Plan - 05/23/18 1206    Clinical Impression Statement  Patient was able to toerate treatment well with no reports of increased lower back pain. Patient's right knee has made minimal improvements in pain therefore, nustep was deferred. Patients goals were assessed and still ongoing. Patient has made improvements in hip strength buthas not achieved strength goal. Patient noted with minimal tenderness to palpation during STW/M and reported decrease of pain at end of manual therapy technique. Patient provided with red theraband to perform rows, extensions, and wood chops at home. Patient reported understanding.    Clinical Presentation  Stable    Clinical Decision Making  Low    Rehab Potential  Good    PT Frequency  2x / week    PT Duration  6 weeks    PT Treatment/Interventions  ADLs/Self Care Home Management;Iontophoresis 4mg /ml Dexamethasone;Neuromuscular re-education;Passive range of motion;Manual techniques;Traction;Ultrasound;Cryotherapy;Dealer  Stimulation;Therapeutic activities;Therapeutic exercise;Functional mobility training;Patient/family education;Taping;Balance training    PT Next Visit Plan  assess knee  pain, if tolerable, Nustep, LE strengthening, core stability, e-stim and ice (can try heat) PRN for pain relief    Consulted and Agree with Plan of Care  Patient       Patient will benefit from skilled therapeutic intervention in order to improve the following deficits and impairments:  Pain, Postural dysfunction, Decreased activity tolerance, Decreased endurance, Decreased strength  Visit Diagnosis: Muscle weakness (generalized)  Chronic right-sided low back pain without sciatica     Problem List Patient Active Problem List   Diagnosis Date Noted  . 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 [place], [place], [address] Phone: [phone]   Fax:  [phone]  Name: Sheryn Aldaz MRN: [id number] Date of Birth: 30-Mar-1946

## 2018-05-26 ENCOUNTER — Ambulatory Visit: Payer: Medicare HMO | Admitting: Physical Therapy

## 2018-05-26 ENCOUNTER — Encounter: Payer: Self-pay | Admitting: Physical Therapy

## 2018-05-26 DIAGNOSIS — M545 Low back pain: Secondary | ICD-10-CM | POA: Diagnosis not present

## 2018-05-26 DIAGNOSIS — G8929 Other chronic pain: Secondary | ICD-10-CM | POA: Diagnosis not present

## 2018-05-26 DIAGNOSIS — M6281 Muscle weakness (generalized): Secondary | ICD-10-CM | POA: Diagnosis not present

## 2018-05-26 NOTE — Patient Instructions (Signed)
[facility]  DRY NEEDLING CONSENT FORM   Trigger point dry needling is a physical therapy approach to treat Myofascial Pain and Dysfunction.  Dry Needling (DN) is a valuable and effective way to deactivate myofascial trigger points (muscle knots/pain). It is skilled intervention that uses a thin filiform needle to penetrate the skin and stimulate underlying myofascial trigger points, muscular, and connective tissues for the management of neuromusculoskeletal pain and movement impairments.  A local twitch response (LTR) will be elicited.  This can sometimes feel like a deep ache in the muscle during the procedure. Multiple trigger points in multiple muscles can be treated during each treatment.  No medication of any kind is injected.   As with any medical treatment and procedure, there are possible adverse events.  While significant adverse events are uncommon, they do sometimes occur and must be considered prior to giving consent.  1. Dry needling often causes a "post needling soreness".  There can be an increase in pain from a couple of hours to 2-3 days, followed by an improvement in the overall pain state. 2. Any time a needle is used there is a risk of infection.  However, we are using new, sterile, and disposable needles; infections are extremely rare. 3. There is a possibility that you may bleed or bruise.  You may feel tired and some nausea following treatment. 4. There is a rare possibility of a pneumothorax (air in the chest cavity). 5. Allergic reaction to nickel in the stainless steel needle. 6. If a nerve is touched, it may cause paresthesia (a prickling/shock sensation) which is usually brief, but may continue for a couple of days.  Following treatment stay hydrated.  Continue regular activities but not too vigorous initially after treatment for 24-48 hours.  Dry Needling is best when combined with other physical therapy interventions such as  strengthening, stretching and other therapeutic modalities.   PLEASE ANSWER THE FOLLOWING QUESTIONS:  Do you have a lack of sensation?   Y/N  Do you have a phobia or fear of needles  Y/N  Are you pregnant?    Y/N If yes:  How many weeks? __________ Do you have any implanted devices?  Y/N If yes:  Pacemaker/Spinal Cord Stimulator/Deep Brain Stimulator/Insulin Pump/Other: ________________ Do you have any implants?  Y/N If yes: Breast/Facial/Pecs/Buttocks/Calves/Hip  Replacement/ Knee Replacement/Other: _________ Do you take any blood thinners?   Y/N If yes: Coumadin (Warfarin)/Other: ___________________ Do you have a bleeding disorder?   Y/N If yes: What kind: _________________________________ Do you take any immunosuppressants?  Y/N If yes:   What kind: _________________________________ Do you take anti-inflammatories?   Y/N If yes: What kind: Advil/Aspirin/Other: ________________ Have you ever been diagnosed with Scoliosis? Y/N Have you had back surgery?   Y/N If yes:  Laminectomy/Fusion/Other: ___________________   I have read, or had read to me, the above.  I have had the opportunity to ask any questions.  All of my questions have been answered to my satisfaction and I understand the risks involved with dry needling.  I consent to examination and treatment at [facility], including dry needling, of any and all of my involved and affected muscles.  

## 2018-05-26 NOTE — Therapy (Signed)
[facility] [place], [place], [address] Phone: [phone]   Fax:  [phone]  Physical Therapy Treatment  Patient Details  Name: Quianna Avery MRN: [id number] Date of Birth: 12-06-1945 Referring Provider (PT): Melrose Nakayama, MD   Encounter Date: 05/26/2018  PT End of Session - 05/26/18 1754    Visit Number  7    Number of Visits  12    Date for PT Re-Evaluation  06/16/18    Authorization Type  Progress note every 10th visit    PT Start Time  0313    PT Stop Time  0403    PT Time Calculation (min)  50 min    Activity Tolerance  Patient tolerated treatment well    Behavior During Therapy  [facility] for tasks assessed/performed       Past Medical History:  Diagnosis Date  . Allergic rhinitis   . Chronic cystitis   . Degenerative disc disease   . GERD (gastroesophageal reflux disease)   . Hemangioma of liver   . History of hiatal hernia   . Hypertension     Past Surgical History:  Procedure Laterality Date  . ABDOMINAL HYSTERECTOMY    . CHOLECYSTECTOMY    . COLON RESECTION N/A 05/31/2015   Procedure: LAPAROSCOPIC REPAIR OF HIATAL HERNIA  AND LAPAROSCOPIC NISSEN FUNDOPLICATION;  Surgeon: Fanny Skates, MD;  Location: [facility];  Service: General;  Laterality: N/A;  . ESOPHAGEAL MANOMETRY N/A 02/27/2015   Procedure: ESOPHAGEAL MANOMETRY (EM);  Surgeon: Garlan Fair, MD;  [facility];  Service: Endoscopy;  Laterality: N/A;  . ESOPHAGOGASTRODUODENOSCOPY (EGD) WITH PROPOFOL N/A 02/26/2015   Procedure: ESOPHAGOGASTRODUODENOSCOPY (EGD) WITH PROPOFOL;  Surgeon: Garlan Fair, MD;  [facility];  Service: Endoscopy;  Laterality: N/A;  . HIATAL HERNIA REPAIR  05/31/2015    There were no vitals filed for this visit.  Subjective Assessment - 05/26/18 1757    Subjective  I'm doing better.    Pertinent History  HTN    Limitations  Sitting;Standing    How long can you sit comfortably?  30-45 minutes     How long can you stand comfortably?  45 minutes    Diagnostic tests  x-ray    Patient Stated Goals  improve ability to perform home duties    Currently in Pain?  Yes    Pain Score  2     Pain Location  Back    Pain Orientation  Right    Pain Descriptors / Indicators  Aching    Pain Type  Chronic pain    Pain Onset  More than a month ago                       [facility] Adult PT Treatment/Exercise - 05/26/18 0001      Modalities   Modalities  Electrical Stimulation;Moist Heat;Ultrasound      Moist Heat Therapy   Number Minutes Moist Heat  20 Minutes    Moist Heat Location  Lumbar Spine      Electrical Stimulation   Electrical Stimulation Location  Left upper glut.    Electrical Stimulation Action  Pre-mod.    Electrical Stimulation Parameters  80-150 Hz x 20 minutes.    Electrical Stimulation Goals  Tone;Pain      Ultrasound   Ultrasound Location  Left SIJ/upper glut.    Ultrasound Parameters  Combo e'stim/U/S at 1.50 W/CM2 x 12 minutes.    Ultrasound Goals  Pain  Manual Therapy   Manual Therapy  Soft tissue mobilization    Soft tissue mobilization  STW/M x 12 minutes to left SIJ, upper glut max/med to reduce pain and release trigger points.                  PT Long Term Goals - 05/23/18 1129      PT LONG TERM GOAL #1   Title  Patient will be independent with HEP    Time  6    Period  Weeks    Status  Achieved      PT LONG TERM GOAL #2   Title  Patient will demonstrate 4+/5 or greater bilateral LE strength to improve stability during functional tasks.    Time  6    Period  Weeks    Status  On-going      PT LONG TERM GOAL #3   Title  Patient will report ability to perform household chores with low back pain less than 4/10.    Time  6    Period  Weeks    Status  On-going   8/10      PT LONG TERM GOAL #4   Title  Patient will report ability to sleep throughout the night with pain less than 3/10 in order to get adequate rest.     Time  6    Period  Weeks    Status  On-going   3-4/10           Plan - 05/26/18 1801    Clinical Impression Statement  Patient did well with treatment today.  She was found to have trigger points in left upper glut max and glut medius.  We discussed dry needling with the patient and she was provided with a handout on this subject.       Patient will benefit from skilled therapeutic intervention in order to improve the following deficits and impairments:  Pain, Postural dysfunction, Decreased activity tolerance, Decreased endurance, Decreased strength  Visit Diagnosis: Muscle weakness (generalized)  Chronic right-sided low back pain without sciatica     Problem List Patient Active Problem List   Diagnosis Date Noted  . 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

## 2018-05-30 ENCOUNTER — Encounter: Payer: Self-pay | Admitting: Physical Therapy

## 2018-05-30 ENCOUNTER — Ambulatory Visit: Payer: Medicare HMO | Admitting: Physical Therapy

## 2018-05-30 DIAGNOSIS — M6281 Muscle weakness (generalized): Secondary | ICD-10-CM

## 2018-05-30 DIAGNOSIS — G8929 Other chronic pain: Secondary | ICD-10-CM

## 2018-05-30 DIAGNOSIS — M545 Low back pain: Secondary | ICD-10-CM

## 2018-05-30 NOTE — Therapy (Signed)
[facility] [place], [place], [address] Phone: [phone]   Fax:  [phone]  Physical Therapy Treatment  Patient Details  Name: Julie Day MRN: [id number] Date of Birth: 1945-09-25 Referring Provider (PT): Melrose Nakayama, MD   Encounter Date: 05/30/2018  PT End of Session - 05/30/18 1153    Visit Number  8    Number of Visits  12    Date for PT Re-Evaluation  06/16/18    Authorization Type  Progress note every 10th visit    PT Start Time  1116    PT Stop Time  1208    PT Time Calculation (min)  52 min    Activity Tolerance  Patient tolerated treatment well    Behavior During Therapy  [facility] for tasks assessed/performed       Past Medical History:  Diagnosis Date  . Allergic rhinitis   . Chronic cystitis   . Degenerative disc disease   . GERD (gastroesophageal reflux disease)   . Hemangioma of liver   . History of hiatal hernia   . Hypertension     Past Surgical History:  Procedure Laterality Date  . ABDOMINAL HYSTERECTOMY    . CHOLECYSTECTOMY    . COLON RESECTION N/A 05/31/2015   Procedure: LAPAROSCOPIC REPAIR OF HIATAL HERNIA  AND LAPAROSCOPIC NISSEN FUNDOPLICATION;  Surgeon: Fanny Skates, MD;  Location: [facility];  Service: General;  Laterality: N/A;  . ESOPHAGEAL MANOMETRY N/A 02/27/2015   Procedure: ESOPHAGEAL MANOMETRY (EM);  Surgeon: Garlan Fair, MD;  [facility];  Service: Endoscopy;  Laterality: N/A;  . ESOPHAGOGASTRODUODENOSCOPY (EGD) WITH PROPOFOL N/A 02/26/2015   Procedure: ESOPHAGOGASTRODUODENOSCOPY (EGD) WITH PROPOFOL;  Surgeon: Garlan Fair, MD;  [facility];  Service: Endoscopy;  Laterality: N/A;  . HIATAL HERNIA REPAIR  05/31/2015    There were no vitals filed for this visit.  Subjective Assessment - 05/30/18 1124    Subjective  Patient arrived with discomfort due to sitting too long    Pertinent History  HTN    Limitations  Sitting;Standing    How long can  you sit comfortably?  30-45 minutes    How long can you stand comfortably?  45 minutes    Diagnostic tests  x-ray    Patient Stated Goals  improve ability to perform home duties    Currently in Pain?  Yes    Pain Score  5     Pain Location  Back    Pain Orientation  Right    Pain Descriptors / Indicators  Discomfort;Sore    Pain Type  Chronic pain    Pain Onset  More than a month ago    Pain Frequency  Constant    Aggravating Factors   prolong sitting    Pain Relieving Factors  walking                       [facility] Adult PT Treatment/Exercise - 05/30/18 0001      Moist Heat Therapy   Number Minutes Moist Heat  15 Minutes    Moist Heat Location  Lumbar Spine      Electrical Stimulation   Electrical Stimulation Location  right upper glut    Electrical Stimulation Action  premod    Electrical Stimulation Parameters  80-150hz  x51min    Electrical Stimulation Goals  Tone;Pain      Ultrasound   Ultrasound Location  right uper glut    Ultrasound Parameters  [place] @ 1.5w/cm2/50%/43mhz  x18min      Manual Therapy   Manual Therapy  Soft tissue mobilization;Myofascial release    Manual therapy comments  manual STW/MFR to right upper glut/SI Joint area of pain to decrease pain    Myofascial Release  TPR to R glute to decrease tone             PT Education - 05/30/18 1159    Education Details  Educated patient on core activation exercises in supine and may progress to prone UE to LE to alt UE/LE per tolerance    Person(s) Educated  Patient    Methods  Explanation;Demonstration    Comprehension  Verbalized understanding          PT Long Term Goals - 05/23/18 1129      PT LONG TERM GOAL #1   Title  Patient will be independent with HEP    Time  6    Period  Weeks    Status  Achieved      PT LONG TERM GOAL #2   Title  Patient will demonstrate 4+/5 or greater bilateral LE strength to improve stability during functional tasks.    Time  6    Period  Weeks     Status  On-going      PT LONG TERM GOAL #3   Title  Patient will report ability to perform household chores with low back pain less than 4/10.    Time  6    Period  Weeks    Status  On-going   8/10      PT LONG TERM GOAL #4   Title  Patient will report ability to sleep throughout the night with pain less than 3/10 in order to get adequate rest.    Time  6    Period  Weeks    Status  On-going   3-4/10           Plan - 05/30/18 1201    Clinical Impression Statement  Patient tolerated treatment well today. Patient has ongoing discomfort where her right side muscle"grabs" and for unknown reason. Patient reported more pain with prolong sitting and relief with standing. Patient would like to try dry needle to help reduce her pain. Patient educated on core progression per tolerance. goals ongoing    Rehab Potential  Good    PT Frequency  2x / week    PT Duration  6 weeks    PT Treatment/Interventions  ADLs/Self Care Home Management;Iontophoresis 4mg /ml Dexamethasone;Neuromuscular re-education;Passive range of motion;Manual techniques;Traction;Ultrasound;Cryotherapy;Electrical Stimulation;Therapeutic activities;Therapeutic exercise;Functional mobility training;Patient/family education;Taping;Balance training    PT Next Visit Plan  cont with DN per PT, assess knee pain, if tolerable, Nustep, LE strengthening, core stability, e-stim and ice (can try heat) PRN for pain relief    Consulted and Agree with Plan of Care  Patient       Patient will benefit from skilled therapeutic intervention in order to improve the following deficits and impairments:  Pain, Postural dysfunction, Decreased activity tolerance, Decreased endurance, Decreased strength  Visit Diagnosis: Muscle weakness (generalized)  Chronic right-sided low back pain without sciatica     Problem List Patient Active Problem List   Diagnosis Date Noted  . 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

## 2018-06-02 ENCOUNTER — Encounter: Payer: Medicare HMO | Admitting: *Deleted

## 2018-06-06 ENCOUNTER — Ambulatory Visit: Payer: Medicare HMO | Attending: Orthopaedic Surgery | Admitting: Physical Therapy

## 2018-06-06 DIAGNOSIS — M6281 Muscle weakness (generalized): Secondary | ICD-10-CM

## 2018-06-06 DIAGNOSIS — M545 Low back pain: Secondary | ICD-10-CM | POA: Diagnosis not present

## 2018-06-06 DIAGNOSIS — G8929 Other chronic pain: Secondary | ICD-10-CM | POA: Diagnosis not present

## 2018-06-06 NOTE — Patient Instructions (Addendum)
  Hamstring Stretch, Reclined (Strap, Doorframe)   Lengthen bottom leg on floor. Extend top leg along edge of doorframe or press foot up into yoga strap. Hold for 30 seconds. Repeat 3_ times each leg.  Outer Hip Stretch: Reclined IT Band Stretch (Strap)   Strap around opposite foot, pull across only as far as possible with shoulders on mat. Hold for __30-60__ seconds. Repeat __3__ times each leg. 2-3 x/day.  Then switch the strap to your opposite hand and bring your leg out to that side for a stretch to your inner thigh. Same hold times.  Copyright  VHI. All rights reserved.   Madelyn Flavors, PT'  Quadriceps (Prone)   On stomach with sheet around ankles, knees together, hips down, pull heels toward bottom. Keep hips flat. Hold __30__ seconds. Repeat _3__ times. Do __3__ sessions per day. CAUTION: Stretch should be gentle, steady and slow.  Piriformis Hip Stretch  Put right ankle over left knee. Let right knee fall downward, but keep ankle in place. Feel the stretch in hip. May push down gently with hand to feel stretch. Hold _30-60___ seconds while counting out loud. Repeat with other leg. Repeat ___3_ times. Do __2__ sessions per day.   Trigger Point Dry Needling  . What is Trigger Point Dry Needling (DN)? o DN is a physical therapy technique used to treat muscle pain and dysfunction. Specifically, DN helps deactivate muscle trigger points (muscle knots).  o A thin filiform needle is used to penetrate the skin and stimulate the underlying trigger point. The goal is for a local twitch response (LTR) to occur and for the trigger point to relax. No medication of any kind is injected during the procedure.   . What Does Trigger Point Dry Needling Feel Like?  o The procedure feels different for each individual patient. Some patients report that they do not actually feel the needle enter the skin and overall the process is not painful. Very mild bleeding may occur. However, many  patients feel a deep cramping in the muscle in which the needle was inserted. This is the local twitch response.   Marland Kitchen How Will I feel after the treatment? o Soreness is normal, and the onset of soreness may not occur for a few hours. Typically this soreness does not last longer than two days.  o Bruising is uncommon, however; ice can be used to decrease any possible bruising.  o In rare cases feeling tired or nauseous after the treatment is normal. In addition, your symptoms may get worse before they get better, this period will typically not last longer than 24 hours.   . What Can I do After My Treatment? o Increase your hydration by drinking more water for the next 24 hours. o You may place ice or heat on the areas treated that have become sore, however, do not use heat on inflamed or bruised areas. Heat often brings more relief post needling. o You can continue your regular activities, but vigorous activity is not recommended initially after the treatment for 24 hours. o DN is best combined with other physical therapy such as strengthening, stretching, and other therapies.    Madelyn Flavors, PT 06/06/18 12:07 PM [facility] [place], [place], [address] Phone: [phone]   Fax:  [phone]

## 2018-06-06 NOTE — Therapy (Signed)
[facility] [place], [place], [address] Phone: [phone]   Fax:  [phone]  Physical Therapy Treatment  Patient Details  Name: Julie Day MRN: [id number] Date of Birth: 06/24/1946 Referring Provider (PT): Melrose Nakayama, MD   Encounter Date: 06/06/2018  PT End of Session - 06/06/18 1119    Visit Number  9    Number of Visits  12    Date for PT Re-Evaluation  06/16/18    Authorization Type  Progress note every 10th visit    PT Start Time  1118    PT Stop Time  1218    PT Time Calculation (min)  60 min    Activity Tolerance  Patient tolerated treatment well    Behavior During Therapy  [facility] for tasks assessed/performed       Past Medical History:  Diagnosis Date  . Allergic rhinitis   . Chronic cystitis   . Degenerative disc disease   . GERD (gastroesophageal reflux disease)   . Hemangioma of liver   . History of hiatal hernia   . Hypertension     Past Surgical History:  Procedure Laterality Date  . ABDOMINAL HYSTERECTOMY    . CHOLECYSTECTOMY    . COLON RESECTION N/A 05/31/2015   Procedure: LAPAROSCOPIC REPAIR OF HIATAL HERNIA  AND LAPAROSCOPIC NISSEN FUNDOPLICATION;  Surgeon: Fanny Skates, MD;  Location: [place];  Service: General;  Laterality: N/A;  . ESOPHAGEAL MANOMETRY N/A 02/27/2015   Procedure: ESOPHAGEAL MANOMETRY (EM);  Surgeon: Garlan Fair, MD;  [facility];  Service: Endoscopy;  Laterality: N/A;  . ESOPHAGOGASTRODUODENOSCOPY (EGD) WITH PROPOFOL N/A 02/26/2015   Procedure: ESOPHAGOGASTRODUODENOSCOPY (EGD) WITH PROPOFOL;  Surgeon: Garlan Fair, MD;  [facility];  Service: Endoscopy;  Laterality: N/A;  . HIATAL HERNIA REPAIR  05/31/2015    There were no vitals filed for this visit.  Subjective Assessment - 06/06/18 1119    Subjective  Patient had a bad day Saturday where pain was "grabbing" her.    Pertinent History  HTN    Patient Stated Goals  improve ability to  perform home duties    Currently in Pain?  Yes    Pain Score  2     Pain Location  Back    Pain Orientation  Right    Pain Descriptors / Indicators  Aching;Sore    Pain Type  Chronic pain                       [facility] Adult PT Treatment/Exercise - 06/06/18 0001      Self-Care   Self-Care  Other Self-Care Comments    Other Self-Care Comments   DN education and aftercare      Exercises   Exercises  Knee/Hip      Knee/Hip Exercises: Stretches   Active Hamstring Stretch  Right;1 rep;30 seconds    Active Hamstring Stretch Limitations  with strap    Quad Stretch  Right;1 rep;30 seconds    ITB Stretch  Right;1 rep;30 seconds    ITB Stretch Limitations  with strap    Piriformis Stretch  Right;1 rep;20 seconds    Other Knee/Hip Stretches  supine hip ADD stretch with strap x 30 sec Right      Modalities   Modalities  Electrical Stimulation;Moist Heat      Moist Heat Therapy   Number Minutes Moist Heat  15 Minutes    Moist Heat Location  Lumbar Spine;Other (comment)   R quads  Programme researcher, broadcasting/film/video  premod    Electrical Stimulation Parameters  80-150 Hz x 15 min    Electrical Stimulation Goals  Pain;Tone      Manual Therapy   Manual Therapy  Soft tissue mobilization;Myofascial release    Soft tissue mobilization  to R lumbar, gluteals and lateral quads    Myofascial Release  to R ITB       Trigger Point Dry Needling - 06/06/18 1209    Consent Given?  Yes    Education Handout Provided  Yes    Muscles Treated Upper Body  Longissimus    Muscles Treated Lower Body  Gluteus minimus;Gluteus maximus;Piriformis;Quadriceps    Longissimus Response  Twitch response elicited;Palpable increased muscle length    Gluteus Maximus Response  Twitch response elicited;Palpable increased muscle length    Gluteus Minimus Response  Twitch response elicited;Palpable increased muscle length     Piriformis Response  Twitch response elicited;Palpable increased muscle length    Quadriceps Response  Twitch response elicited;Palpable increased muscle length   Rt lateral          PT Education - 06/06/18 1213    Education Details  HEP and self care    Person(s) Educated  Patient    Methods  Explanation;Demonstration;Handout;Verbal cues    Comprehension  Verbalized understanding;Returned demonstration          PT Long Term Goals - 05/23/18 1129      PT LONG TERM GOAL #1   Title  Patient will be independent with HEP    Time  6    Period  Weeks    Status  Achieved      PT LONG TERM GOAL #2   Title  Patient will demonstrate 4+/5 or greater bilateral LE strength to improve stability during functional tasks.    Time  6    Period  Weeks    Status  On-going      PT LONG TERM GOAL #3   Title  Patient will report ability to perform household chores with low back pain less than 4/10.    Time  6    Period  Weeks    Status  On-going   8/10      PT LONG TERM GOAL #4   Title  Patient will report ability to sleep throughout the night with pain less than 3/10 in order to get adequate rest.    Time  6    Period  Weeks    Status  On-going   3-4/10           Plan - 06/06/18 1214    Clinical Impression Statement  Patient was educated re: DN and gave consent. She had an excellent response in R lumbar multifidi/logissimus and reported decreased pain with mobility immediately following. Patient cont to c/o R knee pain which started during PT so PT assessed. She has tenderness along R MJL and along lateral quads and ITB. R ITB and quads are tight as well as R piriformis and hip ADDuctors. Lateral quad was also DN and stretches issued to address tightness.    PT Treatment/Interventions  ADLs/Self Care Home Management;Iontophoresis 4mg /ml Dexamethasone;Neuromuscular re-education;Passive range of motion;Manual techniques;Traction;Ultrasound;Cryotherapy;Electrical  Stimulation;Therapeutic activities;Therapeutic exercise;Functional mobility training;Patient/family education;Taping;Balance training    PT Next Visit Plan  FOTO/PN 10TH VISIT; Assess DN. Continue with ITB release as tolerated.  Nustep, LE strengthening, core stability, e-stim and ice (can try heat) PRN for pain  relief       Patient will benefit from skilled therapeutic intervention in order to improve the following deficits and impairments:  Pain, Postural dysfunction, Decreased activity tolerance, Decreased endurance, Decreased strength  Visit Diagnosis: Muscle weakness (generalized)  Chronic right-sided low back pain without sciatica     Problem List Patient Active Problem List   Diagnosis Date Noted  . 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 [place], [place], [address] Phone: [phone]   Fax:  [phone]  Name: Miciah Shealy MRN: [id number] Date of Birth: 11-Apr-1946

## 2018-06-09 ENCOUNTER — Ambulatory Visit: Payer: Medicare HMO | Admitting: *Deleted

## 2018-06-09 DIAGNOSIS — M545 Low back pain: Secondary | ICD-10-CM

## 2018-06-09 DIAGNOSIS — M6281 Muscle weakness (generalized): Secondary | ICD-10-CM

## 2018-06-09 DIAGNOSIS — G8929 Other chronic pain: Secondary | ICD-10-CM | POA: Diagnosis not present

## 2018-06-09 NOTE — Therapy (Signed)
[facility] [place], [place], [address] Phone: [phone]   Fax:  [phone]  Physical Therapy Treatment  Patient Details  Name: Julie Day MRN: [id number] Date of Birth: 05-15-1946 Referring Provider (PT): Melrose Nakayama, MD   Encounter Date: 06/09/2018  PT End of Session - 06/09/18 2310    Visit Number  10    Number of Visits  12    Date for PT Re-Evaluation  06/16/18    PT Start Time  9628    PT Stop Time  1605    PT Time Calculation (min)  50 min       Past Medical History:  Diagnosis Date  . Allergic rhinitis   . Chronic cystitis   . Degenerative disc disease   . GERD (gastroesophageal reflux disease)   . Hemangioma of liver   . History of hiatal hernia   . Hypertension     Past Surgical History:  Procedure Laterality Date  . ABDOMINAL HYSTERECTOMY    . CHOLECYSTECTOMY    . COLON RESECTION N/A 05/31/2015   Procedure: LAPAROSCOPIC REPAIR OF HIATAL HERNIA  AND LAPAROSCOPIC NISSEN FUNDOPLICATION;  Surgeon: Fanny Skates, MD;  Location: [place];  Service: General;  Laterality: N/A;  . ESOPHAGEAL MANOMETRY N/A 02/27/2015   Procedure: ESOPHAGEAL MANOMETRY (EM);  Surgeon: Garlan Fair, MD;  [facility];  Service: Endoscopy;  Laterality: N/A;  . ESOPHAGOGASTRODUODENOSCOPY (EGD) WITH PROPOFOL N/A 02/26/2015   Procedure: ESOPHAGOGASTRODUODENOSCOPY (EGD) WITH PROPOFOL;  Surgeon: Garlan Fair, MD;  [facility];  Service: Endoscopy;  Laterality: N/A;  . HIATAL HERNIA REPAIR  05/31/2015    There were no vitals filed for this visit.  Subjective Assessment - 06/09/18 1522    Subjective  DN helped with the pain some. 2/10    Pertinent History  HTN    Limitations  Sitting;Standing    How long can you sit comfortably?  30-45 minutes    How long can you stand comfortably?  45 minutes    Diagnostic tests  x-ray    Patient Stated Goals  improve ability to perform home duties    Currently  in Pain?  Yes    Pain Score  2     Pain Location  Back    Pain Orientation  Right    Pain Descriptors / Indicators  Aching;Sore                       [facility] Adult PT Treatment/Exercise - 06/09/18 0001      Modalities   Modalities  Electrical Stimulation;Moist Heat      Moist Heat Therapy   Number Minutes Moist Heat  15 Minutes    Moist Heat Location  Lumbar Spine;Other (comment)      Acupuncturist Location  R lumbar/glut  IFC x 15 mins 80-150hz     Electrical Stimulation Goals  Pain;Tone      Ultrasound   Ultrasound Location  RT upper glute/ SIJ prone    Ultrasound Parameters  Combo @ 1.5 w/ cm2 x 12 mins    Ultrasound Goals  Pain      Manual Therapy   Manual Therapy  Soft tissue mobilization;Myofascial release    Manual therapy comments  manual STW/MFR to right upper glut/SI Joint area of pain to decrease pain    Myofascial Release  --                  PT  Long Term Goals - 05/23/18 1129      PT LONG TERM GOAL #1   Title  Patient will be independent with HEP    Time  6    Period  Weeks    Status  Achieved      PT LONG TERM GOAL #2   Title  Patient will demonstrate 4+/5 or greater bilateral LE strength to improve stability during functional tasks.    Time  6    Period  Weeks    Status  On-going      PT LONG TERM GOAL #3   Title  Patient will report ability to perform household chores with low back pain less than 4/10.    Time  6    Period  Weeks    Status  On-going   8/10      PT LONG TERM GOAL #4   Title  Patient will report ability to sleep throughout the night with pain less than 3/10 in order to get adequate rest.    Time  6    Period  Weeks    Status  On-going   3-4/10           Plan - 06/09/18 2319    Clinical Impression Statement  pt arrived today reporting doing a little better after DN last RX. Pt did well with [place] Combo and with STW and had notable decrease in  tightness/soreness Rt  SIJ and glute. Normal modality response    Clinical Presentation  Stable    Clinical Decision Making  Low    Rehab Potential  Good    PT Frequency  2x / week    PT Duration  6 weeks    PT Treatment/Interventions  ADLs/Self Care Home Management;Iontophoresis 4mg /ml Dexamethasone;Neuromuscular re-education;Passive range of motion;Manual techniques;Traction;Ultrasound;Cryotherapy;Electrical Stimulation;Therapeutic activities;Therapeutic exercise;Functional mobility training;Patient/family education;Taping;Balance training    PT Next Visit Plan  FOTO/PN 10TH VISIT; Assess DN. Continue with ITB release as tolerated.  Nustep, LE strengthening, core stability, e-stim and ice (can try heat) PRN for pain relief    PT Home Exercise Plan  see patient education section       Patient will benefit from skilled therapeutic intervention in order to improve the following deficits and impairments:  Pain, Postural dysfunction, Decreased activity tolerance, Decreased endurance, Decreased strength  Visit Diagnosis: Muscle weakness (generalized)  Chronic right-sided low back pain without sciatica     Problem List Patient Active Problem List   Diagnosis Date Noted  . 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 [place], [place], [address] Phone: [phone]   Fax:  [phone]  Name: Gelena Klosinski MRN: [id number] Date of Birth: 22-Nov-1945

## 2018-06-13 DIAGNOSIS — K219 Gastro-esophageal reflux disease without esophagitis: Secondary | ICD-10-CM | POA: Diagnosis not present

## 2018-06-13 DIAGNOSIS — I10 Essential (primary) hypertension: Secondary | ICD-10-CM | POA: Diagnosis not present

## 2018-06-13 DIAGNOSIS — M25561 Pain in right knee: Secondary | ICD-10-CM | POA: Diagnosis not present

## 2018-06-14 ENCOUNTER — Encounter: Payer: Self-pay | Admitting: Physical Therapy

## 2018-06-14 ENCOUNTER — Ambulatory Visit: Payer: Medicare HMO | Admitting: Physical Therapy

## 2018-06-14 DIAGNOSIS — M545 Low back pain: Secondary | ICD-10-CM | POA: Diagnosis not present

## 2018-06-14 DIAGNOSIS — M6281 Muscle weakness (generalized): Secondary | ICD-10-CM

## 2018-06-14 DIAGNOSIS — G8929 Other chronic pain: Secondary | ICD-10-CM

## 2018-06-14 NOTE — Therapy (Signed)
[facility] [place], [place], [address] Phone: [phone]   Fax:  [phone]  Physical Therapy Treatment  Patient Details  Name: Julie Day MRN: [id number] Date of Birth: 05/10/1946 Referring Provider (PT): Melrose Nakayama, MD   Encounter Date: 06/14/2018  PT End of Session - 06/14/18 1230    Visit Number  11    Number of Visits  12    Date for PT Re-Evaluation  06/16/18    Authorization Type  Progress note every 10th visit    PT Start Time  1030    PT Stop Time  1124    PT Time Calculation (min)  54 min    Activity Tolerance  Patient tolerated treatment well    Behavior During Therapy  [facility] for tasks assessed/performed       Past Medical History:  Diagnosis Date  . Allergic rhinitis   . Chronic cystitis   . Degenerative disc disease   . GERD (gastroesophageal reflux disease)   . Hemangioma of liver   . History of hiatal hernia   . Hypertension     Past Surgical History:  Procedure Laterality Date  . ABDOMINAL HYSTERECTOMY    . CHOLECYSTECTOMY    . COLON RESECTION N/A 05/31/2015   Procedure: LAPAROSCOPIC REPAIR OF HIATAL HERNIA  AND LAPAROSCOPIC NISSEN FUNDOPLICATION;  Surgeon: Fanny Skates, MD;  Location: [place];  Service: General;  Laterality: N/A;  . ESOPHAGEAL MANOMETRY N/A 02/27/2015   Procedure: ESOPHAGEAL MANOMETRY (EM);  Surgeon: Garlan Fair, MD;  [facility];  Service: Endoscopy;  Laterality: N/A;  . ESOPHAGOGASTRODUODENOSCOPY (EGD) WITH PROPOFOL N/A 02/26/2015   Procedure: ESOPHAGOGASTRODUODENOSCOPY (EGD) WITH PROPOFOL;  Surgeon: Garlan Fair, MD;  [facility];  Service: Endoscopy;  Laterality: N/A;  . HIATAL HERNIA REPAIR  05/31/2015    There were no vitals filed for this visit.  Subjective Assessment - 06/14/18 1235    Subjective  I'm better.  Pain at a 2 today.    Patient Stated Goals  improve ability to perform home duties    Currently in Pain?  Yes    Pain  Score  2     Pain Location  Back    Pain Orientation  Right    Pain Descriptors / Indicators  Aching;Sore    Pain Type  Chronic pain    Pain Onset  More than a month ago                       [facility] Adult PT Treatment/Exercise - 06/14/18 0001      Modalities   Modalities  Electrical Stimulation;Moist Heat;Ultrasound      Moist Heat Therapy   Number Minutes Moist Heat  20 Minutes    Moist Heat Location  Lumbar Spine      Electrical Stimulation   Electrical Stimulation Location  Right affected upper glut    Electrical Stimulation Action  Pre-mod.    Electrical Stimulation Parameters  80-150 Hz x 20 minutes.    Electrical Stimulation Goals  Tone;Pain      Ultrasound   Ultrasound Location  ight upper glut.    Ultrasound Parameters  Combo e'stim/U/S at 1.50 W/CM2 x 12 minutes.    Ultrasound Goals  Pain      Manual Therapy   Manual Therapy  Soft tissue mobilization    Soft tissue mobilization  STW/M to right affected upper gluteal region x 12 minutes including ischemic release technique.  PT Long Term Goals - 05/23/18 1129      PT LONG TERM GOAL #1   Title  Patient will be independent with HEP    Time  6    Period  Weeks    Status  Achieved      PT LONG TERM GOAL #2   Title  Patient will demonstrate 4+/5 or greater bilateral LE strength to improve stability during functional tasks.    Time  6    Period  Weeks    Status  On-going      PT LONG TERM GOAL #3   Title  Patient will report ability to perform household chores with low back pain less than 4/10.    Time  6    Period  Weeks    Status  On-going   8/10      PT LONG TERM GOAL #4   Title  Patient will report ability to sleep throughout the night with pain less than 3/10 in order to get adequate rest.    Time  6    Period  Weeks    Status  On-going   3-4/10           Plan - 06/14/18 1240    Clinical Impression Statement  Excellent response to treatment today.   She had a palpable trigger point in her right upper gluteal region today.  She had no pain repoirts after tretament.    PT Treatment/Interventions  ADLs/Self Care Home Management;Iontophoresis 4mg /ml Dexamethasone;Neuromuscular re-education;Passive range of motion;Manual techniques;Traction;Ultrasound;Cryotherapy;Electrical Stimulation;Therapeutic activities;Therapeutic exercise;Functional mobility training;Patient/family education;Taping;Balance training    PT Next Visit Plan  FOTO/PN 10TH VISIT; Assess DN. Continue with ITB release as tolerated.  Nustep, LE strengthening, core stability, e-stim and ice (can try heat) PRN for pain relief    PT Home Exercise Plan  see patient education section    Consulted and Agree with Plan of Care  Patient       Patient will benefit from skilled therapeutic intervention in order to improve the following deficits and impairments:  Pain, Postural dysfunction, Decreased activity tolerance, Decreased endurance, Decreased strength  Visit Diagnosis: Muscle weakness (generalized)  Chronic right-sided low back pain without sciatica     Problem List Patient Active Problem List   Diagnosis Date Noted  . 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 [place], [place], [address] Phone: [phone]   Fax:  [phone]  Name: Alzena Gerber MRN: [id number] Date of Birth: 1945-11-08

## 2018-06-16 ENCOUNTER — Encounter: Payer: Self-pay | Admitting: Physical Therapy

## 2018-06-16 ENCOUNTER — Ambulatory Visit: Payer: Medicare HMO | Admitting: Physical Therapy

## 2018-06-16 DIAGNOSIS — M6281 Muscle weakness (generalized): Secondary | ICD-10-CM | POA: Diagnosis not present

## 2018-06-16 DIAGNOSIS — G8929 Other chronic pain: Secondary | ICD-10-CM

## 2018-06-16 DIAGNOSIS — M545 Low back pain, unspecified: Secondary | ICD-10-CM

## 2018-06-16 NOTE — Therapy (Signed)
[facility] [place], [place], [address] Phone: [phone]   Fax:  [phone]  Physical Therapy Treatment/Discharge  Patient Details  Name: Julie Day MRN: [id number] Date of Birth: [birth year] Referring Provider (PT): Melrose Nakayama, MD   Encounter Date: 06/16/2018  PT End of Session - 06/16/18 1519    Visit Number  12    Number of Visits  12    Date for PT Re-Evaluation  06/16/18    Authorization Type  Progress note every 10th visit    PT Start Time  1519    PT Stop Time  1604    PT Time Calculation (min)  45 min    Activity Tolerance  Patient tolerated treatment well    Behavior During Therapy  [facility] for tasks assessed/performed       Past Medical History:  Diagnosis Date  . Allergic rhinitis   . Chronic cystitis   . Degenerative disc disease   . GERD (gastroesophageal reflux disease)   . Hemangioma of liver   . History of hiatal hernia   . Hypertension     Past Surgical History:  Procedure Laterality Date  . ABDOMINAL HYSTERECTOMY    . CHOLECYSTECTOMY    . COLON RESECTION N/A 05/31/2015   Procedure: LAPAROSCOPIC REPAIR OF HIATAL HERNIA  AND LAPAROSCOPIC NISSEN FUNDOPLICATION;  Surgeon: Fanny Skates, MD;  Location: [place];  Service: General;  Laterality: N/A;  . ESOPHAGEAL MANOMETRY N/A 02/27/2015   Procedure: ESOPHAGEAL MANOMETRY (EM);  Surgeon: Garlan Fair, MD;  [facility];  Service: Endoscopy;  Laterality: N/A;  . ESOPHAGOGASTRODUODENOSCOPY (EGD) WITH PROPOFOL N/A 02/26/2015   Procedure: ESOPHAGOGASTRODUODENOSCOPY (EGD) WITH PROPOFOL;  Surgeon: Garlan Fair, MD;  [facility];  Service: Endoscopy;  Laterality: N/A;  . HIATAL HERNIA REPAIR  05/31/2015    There were no vitals filed for this visit.  Subjective Assessment - 06/16/18 1518    Subjective  Reports her low back is aggravated as she was walking and standing on concrete.    Pertinent History  HTN    Limitations   Sitting;Standing    How long can you sit comfortably?  30-45 minutes    How long can you stand comfortably?  45 minutes    Diagnostic tests  x-ray    Patient Stated Goals  improve ability to perform home duties    Currently in Pain?  Yes    Pain Score  3     Pain Location  Back    Pain Orientation  Lower    Pain Descriptors / Indicators  Discomfort    Pain Type  Chronic pain    Pain Onset  More than a month ago    Pain Frequency  Intermittent    Aggravating Factors   Movement         [facility] PT Assessment - 06/16/18 0001      Assessment   Medical Diagnosis  Low back pain    Referring Provider (PT)  Melrose Nakayama, MD    Next MD Visit  06/22/2018    Prior Therapy  yes      Precautions   Precautions  None      Restrictions   Weight Bearing Restrictions  No      ROM / Strength   AROM / PROM / Strength  Strength      Strength   Overall Strength  Within functional limits for tasks performed    Strength Assessment Site  Hip;Knee    Right/Left  Hip  Left;Right    Right Hip Flexion  4+/5    Right Hip ABduction  4+/5    Left Hip Flexion  4+/5    Left Hip ABduction  4+/5    Right/Left Knee  Right;Left    Right Knee Flexion  4+/5    Right Knee Extension  4+/5    Left Knee Flexion  4+/5    Left Knee Extension  4+/5                   [facility] Adult PT Treatment/Exercise - 06/16/18 0001      Modalities   Modalities  Electrical Stimulation;Moist Heat;Ultrasound      Moist Heat Therapy   Number Minutes Moist Heat  15 Minutes    Moist Heat Location  Lumbar Spine      Electrical Stimulation   Electrical Stimulation Location  R low back/glute    Electrical Stimulation Action  Pre-Mod    Electrical Stimulation Parameters  80-150 hz x15 min    Electrical Stimulation Goals  Tone;Pain      Ultrasound   Ultrasound Location  R low back    Ultrasound Parameters  1.5 w/cm2, 100%, 1 mhz x69m   Ultrasound Goals  Pain      Manual Therapy   Manual Therapy  Soft tissue  mobilization    Soft tissue mobilization  STW to R lumbar paraspinals, QL, superior glute                  PT Long Term Goals - 06/16/18 1547      PT LONG TERM GOAL #1   Title  Patient will be independent with HEP    Time  6    Period  Weeks    Status  Achieved      PT LONG TERM GOAL #2   Title  Patient will demonstrate 4+/5 or greater bilateral LE strength to improve stability during functional tasks.    Time  6    Period  Weeks    Status  Achieved      PT LONG TERM GOAL #3   Title  Patient will report ability to perform household chores with low back pain less than 4/10.    Time  6    Period  Weeks    Status  Not Met   5/10      PT LONG TERM GOAL #4   Title  Patient will report ability to sleep throughout the night with pain less than 3/10 in order to get adequate rest.    Time  6    Period  Weeks    Status  Not Met   4-5/10 and still waking her at night 06/16/2018           Plan - 06/16/18 1643    Clinical Impression Statement  Patient presented in clinic with reports of continued R low back pain and soreness along superior glute. Increased pain with movement and from shopping yesterday on concrete floors. Normal modalities response noted following removal of the modalities. Patient able to meet goals except for LBP goals for ADLs and sleeping. Patient to return to MD next week.    Rehab Potential  Good    PT Frequency  2x / week    PT Duration  6 weeks    PT Treatment/Interventions  ADLs/Self Care Home Management;Iontophoresis 418mml Dexamethasone;Neuromuscular re-education;Passive range of motion;Manual techniques;Traction;Ultrasound;Cryotherapy;Electrical Stimulation;Therapeutic activities;Therapeutic exercise;Functional mobility training;Patient/family education;Taping;Balance training    PT Next Visit  Plan  FOTO/PN 10TH VISIT; Assess DN. Continue with ITB release as tolerated.  Nustep, LE strengthening, core stability, e-stim and ice (can try heat) PRN  for pain relief    PT Home Exercise Plan  see patient education section    Consulted and Agree with Plan of Care  Patient       Patient will benefit from skilled therapeutic intervention in order to improve the following deficits and impairments:  Pain, Postural dysfunction, Decreased activity tolerance, Decreased endurance, Decreased strength  Visit Diagnosis: Muscle weakness (generalized)  Chronic right-sided low back pain without sciatica     Problem List Patient Active Problem List   Diagnosis Date Noted  . 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  Patient goals were partially met. Patient is being discharged due to lack of progress.  ?????       Standley Brooking, [place] 06/16/18 4:58 PM   [facility] [place], [place], [address] Phone: [phone]   Fax:  [phone]  Name: Sameria Morss MRN: [id number] Date of Birth: [birth year]

## 2018-06-22 DIAGNOSIS — M1711 Unilateral primary osteoarthritis, right knee: Secondary | ICD-10-CM | POA: Diagnosis not present

## 2018-06-22 DIAGNOSIS — M1712 Unilateral primary osteoarthritis, left knee: Secondary | ICD-10-CM | POA: Diagnosis not present

## 2018-07-22 DIAGNOSIS — Z1231 Encounter for screening mammogram for malignant neoplasm of breast: Secondary | ICD-10-CM | POA: Diagnosis not present

## 2018-07-22 DIAGNOSIS — Z803 Family history of malignant neoplasm of breast: Secondary | ICD-10-CM | POA: Diagnosis not present

## 2018-08-10 DIAGNOSIS — M791 Myalgia, unspecified site: Secondary | ICD-10-CM | POA: Diagnosis not present

## 2018-08-10 DIAGNOSIS — M2550 Pain in unspecified joint: Secondary | ICD-10-CM | POA: Diagnosis not present

## 2018-08-24 DIAGNOSIS — R69 Illness, unspecified: Secondary | ICD-10-CM | POA: Diagnosis not present

## 2018-08-29 DIAGNOSIS — R69 Illness, unspecified: Secondary | ICD-10-CM | POA: Diagnosis not present

## 2018-09-01 DIAGNOSIS — M353 Polymyalgia rheumatica: Secondary | ICD-10-CM | POA: Diagnosis not present

## 2018-09-21 DIAGNOSIS — R69 Illness, unspecified: Secondary | ICD-10-CM | POA: Diagnosis not present

## 2018-10-31 DIAGNOSIS — M8580 Other specified disorders of bone density and structure, unspecified site: Secondary | ICD-10-CM | POA: Diagnosis not present

## 2018-10-31 DIAGNOSIS — M353 Polymyalgia rheumatica: Secondary | ICD-10-CM | POA: Diagnosis not present

## 2018-11-02 DIAGNOSIS — M353 Polymyalgia rheumatica: Secondary | ICD-10-CM | POA: Diagnosis not present

## 2018-12-13 DIAGNOSIS — Z0000 Encounter for general adult medical examination without abnormal findings: Secondary | ICD-10-CM | POA: Diagnosis not present

## 2018-12-13 DIAGNOSIS — M2550 Pain in unspecified joint: Secondary | ICD-10-CM | POA: Diagnosis not present

## 2018-12-13 DIAGNOSIS — M4696 Unspecified inflammatory spondylopathy, lumbar region: Secondary | ICD-10-CM | POA: Diagnosis not present

## 2018-12-13 DIAGNOSIS — Z1389 Encounter for screening for other disorder: Secondary | ICD-10-CM | POA: Diagnosis not present

## 2018-12-13 DIAGNOSIS — I10 Essential (primary) hypertension: Secondary | ICD-10-CM | POA: Diagnosis not present

## 2018-12-13 DIAGNOSIS — M8580 Other specified disorders of bone density and structure, unspecified site: Secondary | ICD-10-CM | POA: Diagnosis not present

## 2018-12-20 DIAGNOSIS — M1990 Unspecified osteoarthritis, unspecified site: Secondary | ICD-10-CM | POA: Diagnosis not present

## 2018-12-20 DIAGNOSIS — M25559 Pain in unspecified hip: Secondary | ICD-10-CM | POA: Diagnosis not present

## 2018-12-20 DIAGNOSIS — M25551 Pain in right hip: Secondary | ICD-10-CM | POA: Diagnosis not present

## 2018-12-20 DIAGNOSIS — M25512 Pain in left shoulder: Secondary | ICD-10-CM | POA: Diagnosis not present

## 2018-12-20 DIAGNOSIS — M25552 Pain in left hip: Secondary | ICD-10-CM | POA: Diagnosis not present

## 2018-12-20 DIAGNOSIS — M25511 Pain in right shoulder: Secondary | ICD-10-CM | POA: Diagnosis not present

## 2018-12-20 DIAGNOSIS — M353 Polymyalgia rheumatica: Secondary | ICD-10-CM | POA: Diagnosis not present

## 2019-01-23 DIAGNOSIS — R69 Illness, unspecified: Secondary | ICD-10-CM | POA: Diagnosis not present

## 2019-01-25 DIAGNOSIS — M1990 Unspecified osteoarthritis, unspecified site: Secondary | ICD-10-CM | POA: Diagnosis not present

## 2019-01-25 DIAGNOSIS — G43B0 Ophthalmoplegic migraine, not intractable: Secondary | ICD-10-CM | POA: Diagnosis not present

## 2019-01-25 DIAGNOSIS — M25559 Pain in unspecified hip: Secondary | ICD-10-CM | POA: Diagnosis not present

## 2019-01-25 DIAGNOSIS — H35033 Hypertensive retinopathy, bilateral: Secondary | ICD-10-CM | POA: Diagnosis not present

## 2019-01-25 DIAGNOSIS — M25569 Pain in unspecified knee: Secondary | ICD-10-CM | POA: Diagnosis not present

## 2019-01-25 DIAGNOSIS — D3132 Benign neoplasm of left choroid: Secondary | ICD-10-CM | POA: Diagnosis not present

## 2019-01-25 DIAGNOSIS — M25511 Pain in right shoulder: Secondary | ICD-10-CM | POA: Diagnosis not present

## 2019-01-25 DIAGNOSIS — H25813 Combined forms of age-related cataract, bilateral: Secondary | ICD-10-CM | POA: Diagnosis not present

## 2019-01-25 DIAGNOSIS — M353 Polymyalgia rheumatica: Secondary | ICD-10-CM | POA: Diagnosis not present

## 2019-02-14 DIAGNOSIS — H43391 Other vitreous opacities, right eye: Secondary | ICD-10-CM | POA: Diagnosis not present

## 2019-02-14 DIAGNOSIS — D3132 Benign neoplasm of left choroid: Secondary | ICD-10-CM | POA: Diagnosis not present

## 2019-02-14 DIAGNOSIS — H33301 Unspecified retinal break, right eye: Secondary | ICD-10-CM | POA: Diagnosis not present

## 2019-02-27 DIAGNOSIS — M353 Polymyalgia rheumatica: Secondary | ICD-10-CM | POA: Diagnosis not present

## 2019-02-27 DIAGNOSIS — M25559 Pain in unspecified hip: Secondary | ICD-10-CM | POA: Diagnosis not present

## 2019-02-27 DIAGNOSIS — M1990 Unspecified osteoarthritis, unspecified site: Secondary | ICD-10-CM | POA: Diagnosis not present

## 2019-02-27 DIAGNOSIS — M25511 Pain in right shoulder: Secondary | ICD-10-CM | POA: Diagnosis not present

## 2019-03-13 DIAGNOSIS — M170 Bilateral primary osteoarthritis of knee: Secondary | ICD-10-CM | POA: Diagnosis not present

## 2019-03-29 DIAGNOSIS — E785 Hyperlipidemia, unspecified: Secondary | ICD-10-CM | POA: Diagnosis not present

## 2019-03-29 DIAGNOSIS — M25511 Pain in right shoulder: Secondary | ICD-10-CM | POA: Diagnosis not present

## 2019-03-29 DIAGNOSIS — M25569 Pain in unspecified knee: Secondary | ICD-10-CM | POA: Diagnosis not present

## 2019-03-29 DIAGNOSIS — M25559 Pain in unspecified hip: Secondary | ICD-10-CM | POA: Diagnosis not present

## 2019-03-29 DIAGNOSIS — M353 Polymyalgia rheumatica: Secondary | ICD-10-CM | POA: Diagnosis not present

## 2019-03-29 DIAGNOSIS — M1990 Unspecified osteoarthritis, unspecified site: Secondary | ICD-10-CM | POA: Diagnosis not present

## 2019-03-29 DIAGNOSIS — Z79899 Other long term (current) drug therapy: Secondary | ICD-10-CM | POA: Diagnosis not present

## 2019-03-29 DIAGNOSIS — I10 Essential (primary) hypertension: Secondary | ICD-10-CM | POA: Diagnosis not present

## 2019-03-29 DIAGNOSIS — K219 Gastro-esophageal reflux disease without esophagitis: Secondary | ICD-10-CM | POA: Diagnosis not present

## 2019-04-14 ENCOUNTER — Other Ambulatory Visit: Payer: Self-pay

## 2019-04-14 DIAGNOSIS — R5383 Other fatigue: Secondary | ICD-10-CM | POA: Diagnosis not present

## 2019-04-14 DIAGNOSIS — L818 Other specified disorders of pigmentation: Secondary | ICD-10-CM | POA: Diagnosis not present

## 2019-04-14 DIAGNOSIS — L658 Other specified nonscarring hair loss: Secondary | ICD-10-CM | POA: Diagnosis not present

## 2019-04-14 DIAGNOSIS — L659 Nonscarring hair loss, unspecified: Secondary | ICD-10-CM | POA: Diagnosis not present

## 2019-04-14 DIAGNOSIS — D509 Iron deficiency anemia, unspecified: Secondary | ICD-10-CM | POA: Diagnosis not present

## 2019-04-14 DIAGNOSIS — L570 Actinic keratosis: Secondary | ICD-10-CM | POA: Diagnosis not present

## 2019-04-14 DIAGNOSIS — C44729 Squamous cell carcinoma of skin of left lower limb, including hip: Secondary | ICD-10-CM | POA: Diagnosis not present

## 2019-04-14 DIAGNOSIS — D485 Neoplasm of uncertain behavior of skin: Secondary | ICD-10-CM | POA: Diagnosis not present

## 2019-04-14 DIAGNOSIS — D692 Other nonthrombocytopenic purpura: Secondary | ICD-10-CM | POA: Diagnosis not present

## 2019-04-14 DIAGNOSIS — L820 Inflamed seborrheic keratosis: Secondary | ICD-10-CM | POA: Diagnosis not present

## 2019-04-19 DIAGNOSIS — R69 Illness, unspecified: Secondary | ICD-10-CM | POA: Diagnosis not present

## 2019-04-24 DIAGNOSIS — R69 Illness, unspecified: Secondary | ICD-10-CM | POA: Diagnosis not present

## 2019-04-27 DIAGNOSIS — R69 Illness, unspecified: Secondary | ICD-10-CM | POA: Diagnosis not present

## 2019-05-11 DIAGNOSIS — M1711 Unilateral primary osteoarthritis, right knee: Secondary | ICD-10-CM | POA: Diagnosis not present

## 2019-05-11 DIAGNOSIS — Z23 Encounter for immunization: Secondary | ICD-10-CM | POA: Diagnosis not present

## 2019-05-11 DIAGNOSIS — R600 Localized edema: Secondary | ICD-10-CM | POA: Diagnosis not present

## 2019-05-11 DIAGNOSIS — M353 Polymyalgia rheumatica: Secondary | ICD-10-CM | POA: Diagnosis not present

## 2019-05-11 DIAGNOSIS — R69 Illness, unspecified: Secondary | ICD-10-CM | POA: Diagnosis not present

## 2019-05-11 DIAGNOSIS — D692 Other nonthrombocytopenic purpura: Secondary | ICD-10-CM | POA: Diagnosis not present

## 2019-05-13 DIAGNOSIS — I10 Essential (primary) hypertension: Secondary | ICD-10-CM | POA: Diagnosis not present

## 2019-05-13 DIAGNOSIS — R300 Dysuria: Secondary | ICD-10-CM | POA: Diagnosis not present

## 2019-05-13 DIAGNOSIS — R350 Frequency of micturition: Secondary | ICD-10-CM | POA: Diagnosis not present

## 2019-05-15 DIAGNOSIS — M1711 Unilateral primary osteoarthritis, right knee: Secondary | ICD-10-CM | POA: Diagnosis not present

## 2019-05-17 DIAGNOSIS — N3020 Other chronic cystitis without hematuria: Secondary | ICD-10-CM | POA: Diagnosis not present

## 2019-05-17 DIAGNOSIS — R109 Unspecified abdominal pain: Secondary | ICD-10-CM | POA: Diagnosis not present

## 2019-05-21 DIAGNOSIS — N3001 Acute cystitis with hematuria: Secondary | ICD-10-CM | POA: Diagnosis not present

## 2019-05-21 DIAGNOSIS — R350 Frequency of micturition: Secondary | ICD-10-CM | POA: Diagnosis not present

## 2019-05-22 DIAGNOSIS — M1711 Unilateral primary osteoarthritis, right knee: Secondary | ICD-10-CM | POA: Diagnosis not present

## 2019-05-29 DIAGNOSIS — M1711 Unilateral primary osteoarthritis, right knee: Secondary | ICD-10-CM | POA: Diagnosis not present

## 2019-05-30 DIAGNOSIS — R69 Illness, unspecified: Secondary | ICD-10-CM | POA: Diagnosis not present

## 2019-06-19 DIAGNOSIS — I10 Essential (primary) hypertension: Secondary | ICD-10-CM | POA: Diagnosis not present

## 2019-07-05 DIAGNOSIS — H2512 Age-related nuclear cataract, left eye: Secondary | ICD-10-CM | POA: Diagnosis not present

## 2019-07-05 DIAGNOSIS — H33301 Unspecified retinal break, right eye: Secondary | ICD-10-CM | POA: Diagnosis not present

## 2019-07-05 DIAGNOSIS — D3132 Benign neoplasm of left choroid: Secondary | ICD-10-CM | POA: Diagnosis not present

## 2019-07-05 DIAGNOSIS — H43391 Other vitreous opacities, right eye: Secondary | ICD-10-CM | POA: Diagnosis not present

## 2019-07-21 ENCOUNTER — Other Ambulatory Visit: Payer: Self-pay

## 2019-07-21 DIAGNOSIS — L570 Actinic keratosis: Secondary | ICD-10-CM | POA: Diagnosis not present

## 2019-07-21 DIAGNOSIS — C44722 Squamous cell carcinoma of skin of right lower limb, including hip: Secondary | ICD-10-CM | POA: Diagnosis not present

## 2019-07-21 DIAGNOSIS — D485 Neoplasm of uncertain behavior of skin: Secondary | ICD-10-CM | POA: Diagnosis not present

## 2019-07-21 DIAGNOSIS — L430 Hypertrophic lichen planus: Secondary | ICD-10-CM | POA: Diagnosis not present

## 2019-08-02 DIAGNOSIS — Z1231 Encounter for screening mammogram for malignant neoplasm of breast: Secondary | ICD-10-CM | POA: Diagnosis not present

## 2019-08-14 DIAGNOSIS — K219 Gastro-esophageal reflux disease without esophagitis: Secondary | ICD-10-CM | POA: Diagnosis not present

## 2019-08-14 DIAGNOSIS — E785 Hyperlipidemia, unspecified: Secondary | ICD-10-CM | POA: Diagnosis not present

## 2019-08-14 DIAGNOSIS — M25559 Pain in unspecified hip: Secondary | ICD-10-CM | POA: Diagnosis not present

## 2019-08-14 DIAGNOSIS — Z79899 Other long term (current) drug therapy: Secondary | ICD-10-CM | POA: Diagnosis not present

## 2019-08-14 DIAGNOSIS — I10 Essential (primary) hypertension: Secondary | ICD-10-CM | POA: Diagnosis not present

## 2019-08-14 DIAGNOSIS — M1990 Unspecified osteoarthritis, unspecified site: Secondary | ICD-10-CM | POA: Diagnosis not present

## 2019-08-14 DIAGNOSIS — M25569 Pain in unspecified knee: Secondary | ICD-10-CM | POA: Diagnosis not present

## 2019-08-14 DIAGNOSIS — M353 Polymyalgia rheumatica: Secondary | ICD-10-CM | POA: Diagnosis not present

## 2019-08-17 DIAGNOSIS — R922 Inconclusive mammogram: Secondary | ICD-10-CM | POA: Diagnosis not present

## 2019-08-30 DIAGNOSIS — M1712 Unilateral primary osteoarthritis, left knee: Secondary | ICD-10-CM | POA: Diagnosis not present

## 2019-08-30 DIAGNOSIS — M170 Bilateral primary osteoarthritis of knee: Secondary | ICD-10-CM | POA: Diagnosis not present

## 2019-08-31 ENCOUNTER — Ambulatory Visit: Payer: Self-pay

## 2019-08-31 DIAGNOSIS — C44722 Squamous cell carcinoma of skin of right lower limb, including hip: Secondary | ICD-10-CM | POA: Diagnosis not present

## 2019-09-07 DIAGNOSIS — R69 Illness, unspecified: Secondary | ICD-10-CM | POA: Diagnosis not present

## 2019-09-08 ENCOUNTER — Other Ambulatory Visit: Payer: Self-pay | Admitting: Orthopaedic Surgery

## 2019-09-08 ENCOUNTER — Ambulatory Visit: Payer: Medicare HMO | Attending: Internal Medicine

## 2019-09-17 ENCOUNTER — Ambulatory Visit: Payer: Self-pay

## 2019-09-25 NOTE — Patient Instructions (Signed)
DUE TO COVID-19 ONLY ONE VISITOR IS ALLOWED TO COME WITH YOU AND STAY IN THE WAITING ROOM ONLY DURING PRE OP AND PROCEDURE DAY OF SURGERY. THE 1 VISITOR MAY VISIT WITH YOU AFTER SURGERY IN YOUR PRIVATE ROOM DURING VISITING HOURS ONLY!  YOU NEED TO HAVE A COVID 19 TEST ON_2/26______ @__10 :20_____, THIS TEST MUST BE DONE BEFORE SURGERY, COME  [place] , [id number].  ([place]) ONCE YOUR COVID TEST IS COMPLETED, PLEASE BEGIN THE QUARANTINE INSTRUCTIONS AS OUTLINED IN YOUR HANDOUT.                Calissa Reisler   Your procedure is scheduled on: 10/03/19   Report to [facility] Main  Entrance   Report to admitting at  6:00 AM     Call this number if you have problems the morning of surgery [place], NO CHEWING GUM [place].  Do not eat food After Midnight.   YOU MAY HAVE CLEAR LIQUIDS FROM MIDNIGHT UNTIL 5:30AM  . At 5:30AM Please finish the prescribed Pre-Surgery  drink.   Nothing by mouth after you finish the  drink !    Take these medicines the morning of surgery with A SIP OF WATER: Zyrtec, Trimethoprim                                 You may not have any metal on your body including hair pins and              piercings  Do not wear jewelry, make-up, lotions, powders or perfumes, deodorant             Do not wear nail polish on your fingernails.  Do not shave  48 hours prior to surgery.           Do not bring valuables to the hospital. [place].  Contacts, dentures or bridgework may not be worn into surgery.                  Please read over the following fact sheets you were given: _____________________________________________________________________             [facility] - Preparing for Surgery  Before surgery, you can play an important role.   Because skin is not sterile, your skin needs to be as  free of germs as possible .  You can reduce the number of germs on your skin by washing with CHG (chlorahexidine gluconate) soap before surgery.   CHG is an antiseptic cleaner which kills germs and bonds with the skin to continue killing germs even after washing. Please DO NOT use if you have an allergy to CHG or antibacterial soaps.   If your skin becomes reddened/irritated stop using the CHG and inform your nurse when you arrive at Short Stay. Do not shave (including legs and underarms) for at least 48 hours prior to the first CHG shower.   Please follow these instructions carefully:  1.  Shower with CHG Soap the night before surgery and the  morning of Surgery.  2.  If you choose to wash your hair, wash your hair first as usual with your  normal  shampoo.  3.  After you  shampoo, rinse your hair and body thoroughly to remove the  shampoo.                                        4.  Use CHG as you would any other liquid soap.  You can apply chg directly  to the skin and wash                       Gently with a scrungie or clean washcloth.  5.  Apply the CHG Soap to your body ONLY FROM THE NECK DOWN.   Do not use on face/ open                           Wound or open sores. Avoid contact with eyes, ears mouth and genitals (private parts).                       Wash face,  Genitals (private parts) with your normal soap.             6.  Wash thoroughly, paying special attention to the area where your surgery  will be performed.  7.  Thoroughly rinse your body with warm water from the neck down.  8.  DO NOT shower/wash with your normal soap after using and rinsing off  the CHG Soap.             9.  Pat yourself dry with a clean towel.            10.  Wear clean pajamas.            11.  Place clean sheets on your bed the night of your first shower and do not  sleep with pets. Day of Surgery : Do not apply any lotions/deodorants the morning of surgery.  Please wear clean clothes to the  hospital/surgery center.  FAILURE TO FOLLOW THESE INSTRUCTIONS MAY RESULT IN THE CANCELLATION OF YOUR SURGERY PATIENT SIGNATURE_________________________________  NURSE SIGNATURE__________________________________  ________________________________________________________________________   Adam Phenix  An incentive spirometer is a tool that can help keep your lungs clear and active. This tool measures how well you are filling your lungs with each breath. Taking long deep breaths may help reverse or decrease the chance of developing breathing (pulmonary) problems (especially infection) following:  A long period of time when you are unable to move or be active. BEFORE THE PROCEDURE   If the spirometer includes an indicator to show your best effort, your nurse or respiratory therapist will set it to a desired goal.  If possible, sit up straight or lean slightly forward. Try not to slouch.  Hold the incentive spirometer in an upright position. INSTRUCTIONS FOR USE  1. Sit on the edge of your bed if possible, or sit up as far as you can in bed or on a chair. 2. Hold the incentive spirometer in an upright position. 3. Breathe out normally. 4. Place the mouthpiece in your mouth and seal your lips tightly around it. 5. Breathe in slowly and as deeply as possible, raising the piston or the ball toward the top of the column. 6. Hold your breath for 3-5 seconds or for as long as possible. Allow the piston or ball to fall to the bottom of the column. 7. Remove the mouthpiece from your mouth  and breathe out normally. 8. Rest for a few seconds and repeat Steps 1 through 7 at least 10 times every 1-2 hours when you are awake. Take your time and take a few normal breaths between deep breaths. 9. The spirometer may include an indicator to show your best effort. Use the indicator as a goal to work toward during each repetition. 10. After each set of 10 deep breaths, practice coughing to be sure  your lungs are clear. If you have an incision (the cut made at the time of surgery), support your incision when coughing by placing a pillow or rolled up towels firmly against it. Once you are able to get out of bed, walk around indoors and cough well. You may stop using the incentive spirometer when instructed by your caregiver.  RISKS AND COMPLICATIONS  Take your time so you do not get dizzy or light-headed.  If you are in pain, you may need to take or ask for pain medication before doing incentive spirometry. It is harder to take a deep breath if you are having pain. AFTER USE  Rest and breathe slowly and easily.  It can be helpful to keep track of a log of your progress. Your caregiver can provide you with a simple table to help with this. If you are using the spirometer at home, follow these instructions: [place] IF:   You are having difficultly using the spirometer.  You have trouble using the spirometer as often as instructed.  Your pain medication is not giving enough relief while using the spirometer.  You develop fever of 100.5 F (38.1 C) or higher. SEEK IMMEDIATE MEDICAL CARE IF:   You cough up bloody sputum that had not been present before.  You develop fever of 102 F (38.9 C) or greater.  You develop worsening pain at or near the incision site. MAKE SURE YOU:   Understand these instructions.  Will watch your condition.  Will get help right away if you are not doing well or get worse. Document Released: 11/30/2006 Document Revised: 10/12/2011 Document Reviewed: 01/31/2007 [facility] Patient Information 2014 [place], [place].   ________________________________________________________________________

## 2019-09-26 ENCOUNTER — Other Ambulatory Visit: Payer: Self-pay

## 2019-09-26 ENCOUNTER — Encounter (HOSPITAL_COMMUNITY): Payer: Self-pay

## 2019-09-26 ENCOUNTER — Encounter (HOSPITAL_COMMUNITY)
Admission: RE | Admit: 2019-09-26 | Discharge: 2019-09-26 | Disposition: A | Discharge status: Home / Self Care | Payer: Medicare HMO | Source: Ambulatory Visit | Attending: Orthopaedic Surgery | Admitting: Orthopaedic Surgery

## 2019-09-26 HISTORY — DX: Angina pectoris, unspecified: I20.9

## 2019-09-26 NOTE — Progress Notes (Signed)
PCP - Dr. Electa Sniff Cardiologist - none  Chest x-ray -  EKG - 09/08/19 Stress Test - 2016 ECHO - no Cardiac Cath - no  Sleep Study - no CPAP -   Fasting Blood Sugar - NA Checks Blood Sugar _____ times a day  Blood Thinner Instructions:na Aspirin Instructions: Last Dose:  Anesthesia review:   Patient denies shortness of breath, fever, cough and chest pain at PAT appointment YES  Patient verbalized understanding of instructions that were given to them at the PAT appointment. Patient was also instructed that they will need to review over the PAT instructions again at home before surgery. yes

## 2019-09-27 ENCOUNTER — Ambulatory Visit (HOSPITAL_COMMUNITY)
Admission: RE | Admit: 2019-09-27 | Discharge: 2019-09-27 | Disposition: A | Discharge status: Home / Self Care | Payer: Medicare HMO | Source: Ambulatory Visit | Attending: Orthopaedic Surgery | Admitting: Orthopaedic Surgery

## 2019-09-27 ENCOUNTER — Encounter (HOSPITAL_COMMUNITY)
Admission: RE | Admit: 2019-09-27 | Discharge: 2019-09-27 | Disposition: A | Discharge status: Home / Self Care | Payer: Medicare HMO | Source: Ambulatory Visit | Attending: Orthopaedic Surgery | Admitting: Orthopaedic Surgery

## 2019-09-27 ENCOUNTER — Other Ambulatory Visit: Payer: Self-pay

## 2019-09-27 DIAGNOSIS — Z01812 Encounter for preprocedural laboratory examination: Secondary | ICD-10-CM | POA: Diagnosis not present

## 2019-09-27 DIAGNOSIS — Z01818 Encounter for other preprocedural examination: Secondary | ICD-10-CM | POA: Diagnosis not present

## 2019-09-27 DIAGNOSIS — Z96659 Presence of unspecified artificial knee joint: Secondary | ICD-10-CM | POA: Diagnosis not present

## 2019-09-27 DIAGNOSIS — Z01810 Encounter for preprocedural cardiovascular examination: Secondary | ICD-10-CM | POA: Diagnosis not present

## 2019-09-27 LAB — CBC WITH DIFFERENTIAL/PLATELET
Abs Immature Granulocytes: 0.02 10*3/uL (ref 0.00–0.07)
Basophils Absolute: 0 10*3/uL (ref 0.0–0.1)
Basophils Relative: 1 %
Eosinophils Absolute: 0.2 10*3/uL (ref 0.0–0.5)
Eosinophils Relative: 3 %
HCT: 43.1 % (ref 36.0–46.0)
Hemoglobin: 13.9 g/dL (ref 12.0–15.0)
Immature Granulocytes: 0 %
Lymphocytes Relative: 27 %
Lymphs Abs: 2.1 10*3/uL (ref 0.7–4.0)
MCH: 31.4 pg (ref 26.0–34.0)
MCHC: 32.3 g/dL (ref 30.0–36.0)
MCV: 97.5 fL (ref 80.0–100.0)
Monocytes Absolute: 0.5 10*3/uL (ref 0.1–1.0)
Monocytes Relative: 7 %
Neutro Abs: 4.9 10*3/uL (ref 1.7–7.7)
Neutrophils Relative %: 62 %
Platelets: 288 10*3/uL (ref 150–400)
RBC: 4.42 MIL/uL (ref 3.87–5.11)
RDW: 13.6 % (ref 11.5–15.5)
WBC: 7.7 10*3/uL (ref 4.0–10.5)
nRBC: 0 % (ref 0.0–0.2)

## 2019-09-27 LAB — URINALYSIS, ROUTINE W REFLEX MICROSCOPIC
Bilirubin Urine: NEGATIVE
Glucose, UA: NEGATIVE mg/dL
Hgb urine dipstick: NEGATIVE
Ketones, ur: NEGATIVE mg/dL
Leukocytes,Ua: NEGATIVE
Nitrite: NEGATIVE
Protein, ur: NEGATIVE mg/dL
Specific Gravity, Urine: 1.005 (ref 1.005–1.030)
pH: 8 (ref 5.0–8.0)

## 2019-09-27 LAB — BASIC METABOLIC PANEL
Anion gap: 8 (ref 5–15)
BUN: 15 mg/dL (ref 8–23)
CO2: 29 mmol/L (ref 22–32)
Calcium: 10.2 mg/dL (ref 8.9–10.3)
Chloride: 104 mmol/L (ref 98–111)
Creatinine, Ser: 0.82 mg/dL (ref 0.44–1.00)
GFR calc Af Amer: >60 mL/min (ref >60)
GFR calc non Af Amer: >60 mL/min (ref >60)
Glucose, Bld: 97 mg/dL (ref 70–99)
Potassium: 3.9 mmol/L (ref 3.5–5.1)
Sodium: 141 mmol/L (ref 135–145)

## 2019-09-27 LAB — SURGICAL PCR SCREEN
MRSA, PCR: NEGATIVE
Staphylococcus aureus: NEGATIVE

## 2019-09-27 LAB — APTT: aPTT: 30 seconds (ref 24–36)

## 2019-09-27 LAB — PROTIME-INR
INR: 0.9 (ref 0.8–1.2)
Prothrombin Time: 12 seconds (ref 11.4–15.2)

## 2019-09-27 LAB — ABO/RH: ABO/RH(D): O POS

## 2019-09-29 ENCOUNTER — Other Ambulatory Visit (HOSPITAL_COMMUNITY): Payer: Medicare HMO

## 2019-09-29 ENCOUNTER — Other Ambulatory Visit (HOSPITAL_COMMUNITY)
Admission: RE | Admit: 2019-09-29 | Discharge: 2019-09-29 | Disposition: A | Discharge status: Home / Self Care | Payer: Medicare HMO | Source: Ambulatory Visit | Attending: Orthopaedic Surgery | Admitting: Orthopaedic Surgery

## 2019-09-29 DIAGNOSIS — Z01812 Encounter for preprocedural laboratory examination: Secondary | ICD-10-CM | POA: Insufficient documentation

## 2019-09-29 DIAGNOSIS — Z20822 Contact with and (suspected) exposure to covid-19: Secondary | ICD-10-CM | POA: Insufficient documentation

## 2019-09-29 LAB — SARS CORONAVIRUS 2 (TAT 6-24 HRS): SARS Coronavirus 2: NEGATIVE

## 2019-09-29 NOTE — Care Plan (Signed)
Ortho Bundle Case Management Note  Patient Details  Name: Julie Day MRN: [id number] Date of Birth: 1946-04-13   Spoke with patient prior to surgery. Will dishcarge to home with family. Has equipment at home HHPT referral to [facility] and OPPT set up with [facility] in [place]. Patient and MD in agreement with this plan. Choice offered.           DME Arranged:    DME Agency:     HH Arranged:  PT HH Agency:  [place] ([place])  Additional Comments: Please contact me with any questions of if this plan should need to change.  Ladell Heads,  [place] Specialist  [phone] 09/29/2019, 1:29 PM

## 2019-09-29 NOTE — H&P (Signed)
TOTAL KNEE ADMISSION H&P  Patient is being admitted for right total knee arthroplasty.  Subjective:  Chief Complaint:right knee pain.  HPI: Julie Day, 74 y.o. female, has a history of pain and functional disability in the right knee due to arthritis and has failed non-surgical conservative treatments for greater than 12 weeks to includeNSAID's and/or analgesics, corticosteriod injections, viscosupplementation injections, flexibility and strengthening excercises, use of assistive devices, weight reduction as appropriate and activity modification.  Onset of symptoms was gradual, starting 5 years ago with gradually worsening course since that time. The patient noted no past surgery on the right knee(s).  Patient currently rates pain in the right knee(s) at 10 out of 10 with activity. Patient has night pain, worsening of pain with activity and weight bearing, pain that interferes with activities of daily living, crepitus and joint swelling.  Patient has evidence of subchondral cysts, subchondral sclerosis, periarticular osteophytes and joint space narrowing by imaging studies. There is no active infection.  Patient Active Problem List   Diagnosis Date Noted  . 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 Allergic rhinitis   . Anginal pain ([place])    Dx as GERD  . Chronic cystitis   . Degenerative disc disease   . GERD (gastroesophageal reflux disease)   . Hemangioma of liver   . History of hiatal hernia 2017  . Hypertension     Past Surgical History:  Procedure Laterality Date  . ABDOMINAL HYSTERECTOMY    . CHOLECYSTECTOMY    . COLON RESECTION N/A 05/31/2015   Procedure: LAPAROSCOPIC REPAIR OF HIATAL HERNIA  AND LAPAROSCOPIC NISSEN FUNDOPLICATION;  Surgeon: Fanny Skates, MD;  Location: [place];  Service: General;  Laterality: N/A;  . ESOPHAGEAL MANOMETRY N/A 02/27/2015   Procedure: ESOPHAGEAL MANOMETRY (EM);  Surgeon: Garlan Fair, MD;  Location: [facility];  Service: Endoscopy;  Laterality: N/A;  . ESOPHAGOGASTRODUODENOSCOPY (EGD) WITH PROPOFOL N/A 02/26/2015   Procedure: ESOPHAGOGASTRODUODENOSCOPY (EGD) WITH PROPOFOL;  Surgeon: Garlan Fair, MD;  Location: [facility];  Service: Endoscopy;  Laterality: N/A;  . HIATAL HERNIA REPAIR  05/31/2015    No current facility-administered medications for this encounter.   Current Outpatient Medications  Medication Sig Dispense Refill Last Dose  . Biotin 5000 MCG TABS Take 5,000 mcg by mouth daily with supper.     . Calcium Carb-Cholecalciferol (CALCIUM 500 + D3 PO) Take 2 tablets by mouth daily.     . cetirizine (ZYRTEC) 10 MG tablet Take 10 mg by mouth daily.      Marland Kitchen docusate sodium (COLACE) 50 MG capsule Take 50 mg by mouth at bedtime.     . folic acid (FOLVITE) 1 MG tablet Take 1 mg by mouth daily.     . Multiple Vitamins-Minerals (HAIR FORMULA EXTRA STRENGTH PO) Take 1 capsule by mouth in the morning and at bedtime. Viviscal Hair Growth Supplement     . naproxen sodium (ALEVE) 220 MG tablet Take 220 mg by mouth daily.      . nitroGLYCERIN (NITROSTAT) 0.4 MG SL tablet Place 0.4 mg under the tongue every 5 (five) minutes as needed for chest pain.     Marland Kitchen triamterene-hydrochlorothiazide (MAXZIDE-25) 37.5-25 MG per tablet Take 1 tablet by mouth daily.      Marland Kitchen trimethoprim (TRIMPEX) 100 MG tablet Take 100 mg by mouth daily.     . vitamin B-12 (CYANOCOBALAMIN) 1000 MCG tablet Take 1,000 mcg by mouth daily.  Allergies  Allergen Reactions  . Codeine Nausea And Vomiting    Social History   Tobacco Use  . Smoking status: Former Smoker    Types: Cigarettes    Quit date: 12/20/2008    Years since quitting: 10.7  . Smokeless tobacco: Never Used  Substance Use Topics  . Alcohol use: No    Family History  Problem Relation Age of Onset  . Hypertension Mother   . Hypertension Sister      Review of Systems  Musculoskeletal: Positive for arthralgias.        Right knee  All other systems reviewed and are negative.   Objective:  Physical Exam  Constitutional: She is oriented to person, place, and time. She appears well-developed and well-nourished.  HENT:  Head: Normocephalic and atraumatic.  Eyes: Pupils are equal, round, and reactive to light.  Cardiovascular: Normal rate and regular rhythm.  Respiratory: Effort normal.  GI: Soft.  Musculoskeletal:     Cervical back: Normal range of motion.     Comments: Examination of the right knee shows range of motion from 0-115 of flexion.  Left knee moves from about 0-120s of flexion.  She is tender to palpation along both medial joint lines.  No real effusion bilaterally.  Her ligaments are stable.  Both calves are soft and nontender.  Bilateral hip range of motion is full without pain.  She is neurovascularly intact distally bilaterally.  Neurological: She is alert and oriented to person, place, and time.  Skin: Skin is warm and dry.  Psychiatric: She has a normal mood and affect. Her behavior is normal. Judgment and thought content normal.    Vital signs in last 24 hours:    Labs:   Estimated body mass index is 26.78 kg/m as calculated from the following:   Height as of 09/27/19: 5\' 4"  (1.626 m).   Weight as of 09/27/19: 70.8 kg.   Imaging Review Plain radiographs demonstrate severe degenerative joint disease of the right knee(s). The overall alignment isneutral. The bone quality appears to be good for age and reported activity level.      Assessment/Plan:  End stage primary arthritis, right knee   The patient history, physical examination, clinical judgment of the provider and imaging studies are consistent with end stage degenerative joint disease of the right knee(s) and total knee arthroplasty is deemed medically necessary. The treatment options including medical management, injection therapy arthroscopy and arthroplasty were discussed at length. The risks and benefits of total  knee arthroplasty were presented and reviewed. The risks due to aseptic loosening, infection, stiffness, patella tracking problems, thromboembolic complications and other imponderables were discussed. The patient acknowledged the explanation, agreed to proceed with the plan and consent was signed. Patient is being admitted for inpatient treatment for surgery, pain control, PT, OT, prophylactic antibiotics, VTE prophylaxis, progressive ambulation and ADL's and discharge planning. The patient is planning to be discharged home with home health services     Patient's anticipated LOS is less than 2 midnights, meeting these requirements: - Younger than 41 - Lives within 1 hour of care - Has a competent adult at home to recover with post-op recover - NO history of  - Chronic pain requiring opiods  - Diabetes  - Coronary Artery Disease  - Heart failure  - Heart attack  - Stroke  - DVT/VTE  - Cardiac arrhythmia  - Respiratory Failure/COPD  - Renal failure  - Anemia  - Advanced Liver disease

## 2019-10-02 MED ORDER — TRANEXAMIC ACID 1000 MG/10ML IV SOLN
2000.0000 mg | INTRAVENOUS | Status: DC
  Filled 2019-10-02: qty 20

## 2019-10-02 MED ORDER — BUPIVACAINE LIPOSOME 1.3 % IJ SUSP
20.0000 mL | Freq: Once | INTRAMUSCULAR | Status: DC
  Filled 2019-10-02: qty 20

## 2019-10-02 NOTE — Anesthesia Preprocedure Evaluation (Addendum)
Anesthesia Evaluation  Patient identified by MRN, date of birth, ID band Patient awake    Reviewed: Allergy & Precautions, NPO status , Patient's Chart, lab work & pertinent test results  Airway Mallampati: II  TM Distance: >3 FB Neck ROM: Full    Dental no notable dental hx. (+) Teeth Intact, Implants, Dental Advisory Given   Pulmonary Current SmokerPatient did not abstain from smoking., former smoker,    Pulmonary exam normal breath sounds clear to auscultation       Cardiovascular hypertension, Normal cardiovascular exam Rhythm:Regular Rate:Normal     Neuro/Psych negative neurological ROS  negative psych ROS   GI/Hepatic Neg liver ROS, hiatal hernia, GERD  ,  Endo/Other    Renal/GU negative Renal ROSK+ 3.9 Cr 0.82     Musculoskeletal negative musculoskeletal ROS (+)   Abdominal   Peds  Hematology Hgb 13.9   Anesthesia Other Findings   Reproductive/Obstetrics                           Anesthesia Physical Anesthesia Plan  ASA: II  Anesthesia Plan: Spinal   Post-op Pain Management:  Regional for Post-op pain   Induction:   PONV Risk Score and Plan: 2 and Treatment may vary due to age or medical condition, Ondansetron and Dexamethasone  Airway Management Planned: Nasal Cannula and Natural Airway  Additional Equipment: None  Intra-op Plan:   Post-operative Plan:   Informed Consent: I have reviewed the patients History and Physical, chart, labs and discussed the procedure including the risks, benefits and alternatives for the proposed anesthesia with the patient or authorized representative who has indicated his/her understanding and acceptance.     Dental advisory given  Plan Discussed with:   Anesthesia Plan Comments: (R TKR under spinal w R Adductor canal block)       Anesthesia Quick Evaluation

## 2019-10-03 ENCOUNTER — Ambulatory Visit (HOSPITAL_COMMUNITY): Payer: Medicare HMO | Admitting: Anesthesiology

## 2019-10-03 ENCOUNTER — Other Ambulatory Visit: Payer: Self-pay

## 2019-10-03 ENCOUNTER — Ambulatory Visit (HOSPITAL_COMMUNITY): Payer: Medicare HMO | Admitting: Physician Assistant

## 2019-10-03 ENCOUNTER — Ambulatory Visit (HOSPITAL_COMMUNITY)
Admission: RE | Admit: 2019-10-03 | Discharge: 2019-10-03 | Disposition: A | Discharge status: Home / Self Care | Payer: Medicare HMO | Attending: Orthopaedic Surgery | Admitting: Orthopaedic Surgery

## 2019-10-03 ENCOUNTER — Encounter (HOSPITAL_COMMUNITY): Payer: Self-pay | Admitting: Orthopaedic Surgery

## 2019-10-03 ENCOUNTER — Encounter (HOSPITAL_COMMUNITY)
Admission: RE | Disposition: A | Discharge status: Home / Self Care | Payer: Self-pay | Source: Home / Self Care | Attending: Orthopaedic Surgery

## 2019-10-03 DIAGNOSIS — I10 Essential (primary) hypertension: Secondary | ICD-10-CM | POA: Diagnosis not present

## 2019-10-03 DIAGNOSIS — Z8249 Family history of ischemic heart disease and other diseases of the circulatory system: Secondary | ICD-10-CM | POA: Diagnosis not present

## 2019-10-03 DIAGNOSIS — Z87891 Personal history of nicotine dependence: Secondary | ICD-10-CM | POA: Insufficient documentation

## 2019-10-03 DIAGNOSIS — M1711 Unilateral primary osteoarthritis, right knee: Secondary | ICD-10-CM | POA: Diagnosis not present

## 2019-10-03 DIAGNOSIS — K219 Gastro-esophageal reflux disease without esophagitis: Secondary | ICD-10-CM | POA: Diagnosis not present

## 2019-10-03 DIAGNOSIS — Z79899 Other long term (current) drug therapy: Secondary | ICD-10-CM | POA: Diagnosis not present

## 2019-10-03 DIAGNOSIS — Z885 Allergy status to narcotic agent status: Secondary | ICD-10-CM | POA: Insufficient documentation

## 2019-10-03 DIAGNOSIS — G8918 Other acute postprocedural pain: Secondary | ICD-10-CM | POA: Diagnosis not present

## 2019-10-03 HISTORY — PX: TOTAL KNEE ARTHROPLASTY: SHX125

## 2019-10-03 LAB — TYPE AND SCREEN
ABO/RH(D): O POS
Antibody Screen: NEGATIVE

## 2019-10-03 SURGERY — ARTHROPLASTY, KNEE, TOTAL
Anesthesia: Spinal | Site: Knee | Laterality: Right

## 2019-10-03 MED ORDER — CHLORHEXIDINE GLUCONATE 4 % EX LIQD: 60.0000 mL | Freq: Once | CUTANEOUS | Status: DC

## 2019-10-03 MED ORDER — ONDANSETRON HCL 4 MG/2ML IJ SOLN
INTRAMUSCULAR | Status: DC | PRN
  Administered 2019-10-03: 4 mg via INTRAVENOUS

## 2019-10-03 MED ORDER — PROPOFOL 500 MG/50ML IV EMUL
INTRAVENOUS | Status: DC | PRN
  Administered 2019-10-03: 75 ug/kg/min via INTRAVENOUS

## 2019-10-03 MED ORDER — HYDROCODONE-ACETAMINOPHEN 5-325 MG PO TABS
1.0000 | ORAL_TABLET | Freq: Once | ORAL | Status: AC
  Administered 2019-10-03: 1 via ORAL

## 2019-10-03 MED ORDER — SODIUM CHLORIDE (PF) 0.9 % IJ SOLN
INTRAMUSCULAR | Status: AC
  Filled 2019-10-03: qty 50

## 2019-10-03 MED ORDER — POVIDONE-IODINE 10 % EX SWAB
2.0000 "application " | Freq: Once | CUTANEOUS | Status: AC
  Administered 2019-10-03: 2 via TOPICAL

## 2019-10-03 MED ORDER — DEXAMETHASONE SODIUM PHOSPHATE 10 MG/ML IJ SOLN
INTRAMUSCULAR | Status: DC | PRN
  Administered 2019-10-03: 10 mg via INTRAVENOUS

## 2019-10-03 MED ORDER — TIZANIDINE HCL 4 MG PO TABS: 4.0000 mg | ORAL_TABLET | Freq: Four times a day (QID) | ORAL | 1 refills | Status: AC | PRN

## 2019-10-03 MED ORDER — PROMETHAZINE HCL 12.5 MG PO TABS: 12.5000 mg | ORAL_TABLET | Freq: Four times a day (QID) | ORAL | 1 refills | Status: DC | PRN

## 2019-10-03 MED ORDER — 0.9 % SODIUM CHLORIDE (POUR BTL) OPTIME
TOPICAL | Status: DC | PRN
  Administered 2019-10-03: 1000 mL

## 2019-10-03 MED ORDER — TRANEXAMIC ACID-NACL 1000-0.7 MG/100ML-% IV SOLN
1000.0000 mg | INTRAVENOUS | Status: AC
  Administered 2019-10-03: 1000 mg via INTRAVENOUS
  Filled 2019-10-03: qty 100

## 2019-10-03 MED ORDER — HYDROCODONE-ACETAMINOPHEN 5-325 MG PO TABS
ORAL_TABLET | ORAL | Status: AC
  Filled 2019-10-03: qty 1

## 2019-10-03 MED ORDER — PHENYLEPHRINE 40 MCG/ML (10ML) SYRINGE FOR IV PUSH (FOR BLOOD PRESSURE SUPPORT)
PREFILLED_SYRINGE | INTRAVENOUS | Status: DC | PRN
  Administered 2019-10-03 (×3): 80 ug via INTRAVENOUS

## 2019-10-03 MED ORDER — ONDANSETRON HCL 4 MG/2ML IJ SOLN: 4.0000 mg | Freq: Once | INTRAMUSCULAR | Status: DC | PRN

## 2019-10-03 MED ORDER — LIDOCAINE HCL (CARDIAC) PF 100 MG/5ML IV SOSY
PREFILLED_SYRINGE | INTRAVENOUS | Status: DC | PRN
  Administered 2019-10-03: 60 mg via INTRAVENOUS

## 2019-10-03 MED ORDER — LACTATED RINGERS IV SOLN: INTRAVENOUS | Status: DC | PRN

## 2019-10-03 MED ORDER — EPHEDRINE 5 MG/ML INJ
INTRAVENOUS | Status: AC
  Filled 2019-10-03: qty 10

## 2019-10-03 MED ORDER — FENTANYL CITRATE (PF) 100 MCG/2ML IJ SOLN: 25.0000 ug | INTRAMUSCULAR | Status: DC | PRN

## 2019-10-03 MED ORDER — BUPIVACAINE HCL (PF) 0.25 % IJ SOLN
INTRAMUSCULAR | Status: AC
  Filled 2019-10-03: qty 30

## 2019-10-03 MED ORDER — LACTATED RINGERS IV BOLUS: 250.0000 mL | Freq: Once | INTRAVENOUS | Status: DC

## 2019-10-03 MED ORDER — SODIUM CHLORIDE (PF) 0.9 % IJ SOLN
INTRAMUSCULAR | Status: DC | PRN
  Administered 2019-10-03: 30 mL

## 2019-10-03 MED ORDER — PROPOFOL 10 MG/ML IV BOLUS
INTRAVENOUS | Status: DC | PRN
  Administered 2019-10-03: 20 mg via INTRAVENOUS

## 2019-10-03 MED ORDER — PHENYLEPHRINE HCL-NACL 10-0.9 MG/250ML-% IV SOLN
INTRAVENOUS | Status: DC | PRN
  Administered 2019-10-03: 50 ug/min via INTRAVENOUS

## 2019-10-03 MED ORDER — LACTATED RINGERS IV SOLN: INTRAVENOUS | Status: DC

## 2019-10-03 MED ORDER — MEPIVACAINE HCL (PF) 2 % IJ SOLN
INTRAMUSCULAR | Status: AC
  Filled 2019-10-03: qty 20

## 2019-10-03 MED ORDER — BUPIVACAINE LIPOSOME 1.3 % IJ SUSP
INTRAMUSCULAR | Status: DC | PRN
  Administered 2019-10-03: 20 mL

## 2019-10-03 MED ORDER — BUPIVACAINE HCL (PF) 0.25 % IJ SOLN
INTRAMUSCULAR | Status: DC | PRN
  Administered 2019-10-03: 30 mL

## 2019-10-03 MED ORDER — FENTANYL CITRATE (PF) 100 MCG/2ML IJ SOLN
50.0000 ug | INTRAMUSCULAR | Status: DC
  Administered 2019-10-03: 50 ug via INTRAVENOUS
  Filled 2019-10-03: qty 2

## 2019-10-03 MED ORDER — TRANEXAMIC ACID 1000 MG/10ML IV SOLN
INTRAVENOUS | Status: DC | PRN
  Administered 2019-10-03: 2000 mg via TOPICAL

## 2019-10-03 MED ORDER — EPHEDRINE SULFATE-NACL 50-0.9 MG/10ML-% IV SOSY
PREFILLED_SYRINGE | INTRAVENOUS | Status: DC | PRN
  Administered 2019-10-03: 10 mg via INTRAVENOUS

## 2019-10-03 MED ORDER — METHOCARBAMOL 500 MG IVPB - SIMPLE MED: 500.0000 mg | Freq: Four times a day (QID) | INTRAVENOUS | Status: DC | PRN

## 2019-10-03 MED ORDER — TRANEXAMIC ACID-NACL 1000-0.7 MG/100ML-% IV SOLN: 1000.0000 mg | Freq: Once | INTRAVENOUS | Status: DC

## 2019-10-03 MED ORDER — CEFAZOLIN SODIUM-DEXTROSE 2-4 GM/100ML-% IV SOLN
2.0000 g | INTRAVENOUS | Status: AC
  Administered 2019-10-03: 2 g via INTRAVENOUS
  Filled 2019-10-03: qty 100

## 2019-10-03 MED ORDER — HYDROCODONE-ACETAMINOPHEN 5-325 MG PO TABS: 1.0000 | ORAL_TABLET | Freq: Four times a day (QID) | ORAL | 0 refills | Status: AC | PRN

## 2019-10-03 MED ORDER — CEFAZOLIN SODIUM-DEXTROSE 2-4 GM/100ML-% IV SOLN: 2.0000 g | Freq: Four times a day (QID) | INTRAVENOUS | Status: DC

## 2019-10-03 MED ORDER — PROPOFOL 1000 MG/100ML IV EMUL
INTRAVENOUS | Status: AC
  Filled 2019-10-03: qty 100

## 2019-10-03 MED ORDER — LIDOCAINE 2% (20 MG/ML) 5 ML SYRINGE
INTRAMUSCULAR | Status: AC
  Filled 2019-10-03: qty 5

## 2019-10-03 MED ORDER — LACTATED RINGERS IV BOLUS
250.0000 mL | Freq: Once | INTRAVENOUS | Status: AC
  Administered 2019-10-03: 250 mL via INTRAVENOUS

## 2019-10-03 MED ORDER — METHOCARBAMOL 500 MG PO TABS: 500.0000 mg | ORAL_TABLET | Freq: Four times a day (QID) | ORAL | Status: DC | PRN

## 2019-10-03 MED ORDER — PHENYLEPHRINE HCL (PRESSORS) 10 MG/ML IV SOLN
INTRAVENOUS | Status: AC
  Filled 2019-10-03: qty 1

## 2019-10-03 MED ORDER — MIDAZOLAM HCL 2 MG/2ML IJ SOLN
1.0000 mg | INTRAMUSCULAR | Status: DC
  Administered 2019-10-03 (×2): 1 mg via INTRAVENOUS
  Filled 2019-10-03: qty 2

## 2019-10-03 MED ORDER — MEPIVACAINE HCL (PF) 2 % IJ SOLN
INTRAMUSCULAR | Status: DC | PRN
  Administered 2019-10-03: 60 mg via INTRATHECAL

## 2019-10-03 MED ORDER — ACETAMINOPHEN 10 MG/ML IV SOLN: 1000.0000 mg | Freq: Once | INTRAVENOUS | Status: DC | PRN

## 2019-10-03 MED ORDER — LACTATED RINGERS IV BOLUS
500.0000 mL | Freq: Once | INTRAVENOUS | Status: AC
  Administered 2019-10-03: 500 mL via INTRAVENOUS

## 2019-10-03 MED ORDER — ONDANSETRON HCL 4 MG/2ML IJ SOLN
INTRAMUSCULAR | Status: AC
  Filled 2019-10-03: qty 2

## 2019-10-03 MED ORDER — DEXAMETHASONE SODIUM PHOSPHATE 10 MG/ML IJ SOLN
INTRAMUSCULAR | Status: AC
  Filled 2019-10-03: qty 1

## 2019-10-03 MED ORDER — ASPIRIN EC 81 MG PO TBEC: 81.0000 mg | DELAYED_RELEASE_TABLET | Freq: Two times a day (BID) | ORAL | 0 refills | Status: AC

## 2019-10-03 SURGICAL SUPPLY — 49 items
ATTUNE MED DOME PAT 38 KNEE (Knees) ×1 IMPLANT
ATTUNE PS FEM RT SZ 4 CEM KNEE (Femur) ×1 IMPLANT
ATTUNE PSRP INSR SZ4 6 KNEE (Insert) ×1 IMPLANT
BAG DECANTER FOR FLEXI CONT (MISCELLANEOUS) ×2 IMPLANT
BAG ZIPLOCK 12X15 (MISCELLANEOUS) ×2 IMPLANT
BASEPLATE TIBIAL ROTATING SZ 4 (Knees) ×1 IMPLANT
BLADE SAGITTAL 25.0X1.19X90 (BLADE) ×2 IMPLANT
BLADE SAW SGTL 11.0X1.19X90.0M (BLADE) ×2 IMPLANT
BNDG ELASTIC 6X15 VLCR STRL LF (GAUZE/BANDAGES/DRESSINGS) ×1 IMPLANT
BNDG ELASTIC 6X5.8 VLCR STR LF (GAUZE/BANDAGES/DRESSINGS) ×2 IMPLANT
BOOTIES KNEE HIGH SLOAN (MISCELLANEOUS) ×2 IMPLANT
BOWL SMART MIX CTS (DISPOSABLE) ×2 IMPLANT
CEMENT HV SMART SET (Cement) ×4 IMPLANT
COVER SURGICAL LIGHT HANDLE (MISCELLANEOUS) ×2 IMPLANT
COVER WAND RF STERILE (DRAPES) ×2 IMPLANT
CUFF TOURN SGL QUICK 34 (TOURNIQUET CUFF) ×2
CUFF TRNQT CYL 34X4.125X (TOURNIQUET CUFF) ×1 IMPLANT
DECANTER SPIKE VIAL GLASS SM (MISCELLANEOUS) ×4 IMPLANT
DRAPE SHEET LG 3/4 BI-LAMINATE (DRAPES) ×2 IMPLANT
DRAPE TOP 10253 STERILE (DRAPES) ×2 IMPLANT
DRAPE U-SHAPE 47X51 STRL (DRAPES) ×2 IMPLANT
DRSG AQUACEL AG ADV 3.5X10 (GAUZE/BANDAGES/DRESSINGS) ×2 IMPLANT
DURAPREP 26ML APPLICATOR (WOUND CARE) ×4 IMPLANT
ELECT REM PT RETURN 15FT ADLT (MISCELLANEOUS) ×2 IMPLANT
GLOVE BIO SURGEON STRL SZ8 (GLOVE) ×4 IMPLANT
GLOVE BIOGEL PI IND STRL 8 (GLOVE) ×2 IMPLANT
GLOVE BIOGEL PI INDICATOR 8 (GLOVE) ×2
GOWN STRL REUS W/TWL XL LVL3 (GOWN DISPOSABLE) ×4 IMPLANT
HANDPIECE INTERPULSE COAX TIP (DISPOSABLE) ×2
HOLDER FOLEY CATH W/STRAP (MISCELLANEOUS) IMPLANT
HOOD PEEL AWAY FLYTE STAYCOOL (MISCELLANEOUS) ×6 IMPLANT
KIT TURNOVER KIT A (KITS) IMPLANT
MANIFOLD NEPTUNE II (INSTRUMENTS) ×2 IMPLANT
NS IRRIG 1000ML POUR BTL (IV SOLUTION) ×2 IMPLANT
PACK TOTAL KNEE CUSTOM (KITS) ×2 IMPLANT
PAD ARMBOARD 7.5X6 YLW CONV (MISCELLANEOUS) ×2 IMPLANT
PENCIL SMOKE EVACUATOR (MISCELLANEOUS) IMPLANT
PIN DRILL FIX HALF THREAD (BIT) ×1 IMPLANT
PIN STEINMAN FIXATION KNEE (PIN) ×1 IMPLANT
PROTECTOR NERVE ULNAR (MISCELLANEOUS) ×2 IMPLANT
SET HNDPC FAN SPRY TIP SCT (DISPOSABLE) ×1 IMPLANT
SUT ETHIBOND NAB CT1 #1 30IN (SUTURE) ×4 IMPLANT
SUT VIC AB 0 CT1 36 (SUTURE) ×2 IMPLANT
SUT VIC AB 2-0 CT1 27 (SUTURE) ×2
SUT VIC AB 2-0 CT1 TAPERPNT 27 (SUTURE) ×1 IMPLANT
SUT VICRYL AB 3-0 FS1 BRD 27IN (SUTURE) ×2 IMPLANT
TRAY FOLEY MTR SLVR 16FR STAT (SET/KITS/TRAYS/PACK) IMPLANT
WATER STERILE IRR 1000ML POUR (IV SOLUTION) ×2 IMPLANT
WRAP KNEE MAXI GEL POST OP (GAUZE/BANDAGES/DRESSINGS) ×2 IMPLANT

## 2019-10-03 NOTE — Anesthesia Procedure Notes (Signed)
Spinal  Patient location during procedure: OR Start time: 10/03/2019 8:57 AM End time: 10/03/2019 9:01 AM Staffing Anesthesiologist: Barnet Glasgow, MD Preanesthetic Checklist Completed: patient identified, IV checked, site marked, risks and benefits discussed, surgical consent, monitors and equipment checked, pre-op evaluation and timeout performed Spinal Block Patient position: sitting Prep: DuraPrep Patient monitoring: heart rate, cardiac monitor, continuous pulse ox and blood pressure Approach: midline Location: L3-4 Injection technique: single-shot Needle Needle type: Sprotte  Needle gauge: 24 G Needle length: 9 cm Needle insertion depth: 5 cm Assessment Sensory level: T4 Additional Notes I attempt . Pt tolerated procedure well

## 2019-10-03 NOTE — Anesthesia Procedure Notes (Signed)
Anesthesia Regional Block: Adductor canal block   Pre-Anesthetic Checklist: ,, timeout performed, Correct Patient, Correct Site, Correct Laterality, Correct Procedure, Correct Position, site marked, Risks and benefits discussed,  Surgical consent,  Pre-op evaluation,  At surgeon's request and post-op pain management  Laterality: Lower and Right  Prep: chloraprep       Needles:  Injection technique: Single-shot  Needle Type: Echogenic Needle     Needle Length: 9cm  Needle Gauge: 22     Additional Needles:   Procedures:,,,, ultrasound used (permanent image in chart),,,,  Narrative:  Start time: 10/03/2019 7:45 AM End time: 10/03/2019 7:55 AM Injection made incrementally with aspirations every 5 mL.  Performed by: Personally  Anesthesiologist: Barnet Glasgow, MD  Additional Notes: Block assessed prior to surgery. Pt tolerated procedure well.

## 2019-10-03 NOTE — Interval H&P Note (Signed)
History and Physical Interval Note:  [id number] AM  Julie Day  has presented today for surgery, with the diagnosis of RIGHT KNEE DEGENERATIVE JOINT DISEASE.  The various methods of treatment have been discussed with the patient and family. After consideration of risks, benefits and other options for treatment, the patient has consented to  Procedure(s): RIGHT TOTAL KNEE ARTHROPLASTY (Right) as a surgical intervention.  The patient's history has been reviewed, patient examined, no change in status, stable for surgery.  I have reviewed the patient's chart and labs.  Questions were answered to the patient's satisfaction.     Hessie Dibble

## 2019-10-03 NOTE — Progress Notes (Signed)
AssistedDr. Houser with right, ultrasound guided, adductor canal block. Side rails up, monitors on throughout procedure. See vital signs in flow sheet. Tolerated Procedure well.  

## 2019-10-03 NOTE — Evaluation (Signed)
Physical Therapy Evaluation Patient Details Name: Julie Day MRN: [id number] DOB: [birth year] Today's Date: 10/03/2019   History of Present Illness  Patient is 74 y.o. female s/p Rt TKA on 10/03/19 with PMH significant for HTN, GERD, OA.  Clinical Impression  Julie Day is a 74 y.o. female POD 0 s/p Rt TKA. Patient reports independence with mobility at baseline. Patient is now limited by functional impairments (see PT problem list below) and requires supervision for transfers and gait with RW. Patient was able to ambulate ~110 feet with RW and supervision and cues for safe walker management. Patient educated on safe sequencing for stair mobility and verbalized safe guarding position for people assisting with mobility. Patient instructed in exercises to facilitate ROM and circulation. Patient will benefit from continued skilled PT interventions to address impairments and progress towards PLOF. Patient has met mobility goals at adequate level for discharge home; will continue to follow if pt continues acute stay to progress towards Mod I goals.     Follow Up Recommendations Follow surgeon's recommendation for DC plan and follow-up therapies    Equipment Recommendations  None recommended by PT    Recommendations for Other Services       Precautions / Restrictions Precautions Precautions: Fall Restrictions Weight Bearing Restrictions: No      Mobility  Bed Mobility Overal bed mobility: Needs Assistance Bed Mobility: Supine to Sit;Sit to Supine     Supine to sit: HOB elevated;Supervision Sit to supine: HOB elevated;Supervision   General bed mobility comments: pt required increased time and instructed on use of gait belt to assist with raising Rt LE into/out of bed or car. no assist required.  Transfers Overall transfer level: Needs assistance Equipment used: Rolling walker (2 wheeled) Transfers: Sit to/from Stand Sit to Stand: Supervision         General transfer comment:  cues for safe hand placement and technique with RW, no assist required for power up, pt steady in standing.  Ambulation/Gait Ambulation/Gait assistance: Supervision Gait Distance (Feet): 110 Feet Assistive device: Rolling walker (2 wheeled) Gait Pattern/deviations: Step-to pattern;Decreased stride length;Decreased weight shift to right;Decreased stance time - right Gait velocity: fair   General Gait Details: cues for safe step pattern and proximity to RW at start, pt able to maintain throughout, no overt LOB noted and pt able to use UE support on RW to prevent Rt knee buckling or increase in pain.  Stairs Stairs: Yes Stairs assistance: Supervision Stair Management: Step to pattern;With walker;Backwards Number of Stairs: 2(2x1) General stair comments: pt instructed on safe step pattern with RW, no overt LOB noted. pt with step to pattern "up wtih good, down with bad". pt able to verbalize safe pattern and safe guarding position for spouse and daughter-in-law to assist her with.  Wheelchair Mobility    Modified Rankin (Stroke Patients Only)       Balance Overall balance assessment: Needs assistance   Sitting balance-Leahy Scale: Good     Standing balance support: During functional activity;Bilateral upper extremity supported Standing balance-Leahy Scale: Fair              Pertinent Vitals/Pain Pain Assessment: 0-10 Pain Score: 7  Pain Location: Rt knee Pain Descriptors / Indicators: Aching;Sore Pain Intervention(s): Limited activity within patient's tolerance;Monitored during session;Repositioned    Home Living Family/patient expects to be discharged to:: Private residence Living Arrangements: Spouse/significant other Available Help at Discharge: Family;Available 24 hours/day Type of Home: House Home Access: Stairs to enter Entrance Stairs-Rails: None Entrance Stairs-Number of Steps: 1  in laundry room Home Layout: One level Home Equipment: Bedside commode;Shower  seat;Grab bars - tub/shower;Cane - single point;Walker - 2 wheels      Prior Function Level of Independence: Independent               Hand Dominance   Dominant Hand: Right    Extremity/Trunk Assessment   Upper Extremity Assessment Upper Extremity Assessment: Overall WFL for tasks assessed    Lower Extremity Assessment Lower Extremity Assessment: RLE deficits/detail RLE Deficits / Details: good quad activation and no extensor lag noted during SLR RLE Sensation: WNL RLE Coordination: WNL    Cervical / Trunk Assessment Cervical / Trunk Assessment: Normal  Communication   Communication: No difficulties  Cognition Arousal/Alertness: Awake/alert Behavior During Therapy: WFL for tasks assessed/performed Overall Cognitive Status: Within Functional Limits for tasks assessed         General Comments      Exercises Total Joint Exercises Ankle Circles/Pumps: AROM;10 reps;Supine;Both Quad Sets: AROM;5 reps;Supine;Right Short Arc Quad: AROM;Supine;Right(3 reps) Heel Slides: AROM;Supine;Right(3 reps) Hip ABduction/ADduction: AROM;5 reps;Supine;Right Straight Leg Raises: AROM;Supine;Right(3 reps) Long Arc Quad: AROM;Seated;Right(3 reps) Knee Flexion: AROM;AAROM;5 reps;Seated;Right   Assessment/Plan    PT Assessment Patient needs continued PT services  PT Problem List Decreased strength;Decreased balance;Decreased mobility;Decreased range of motion;Decreased activity tolerance;Decreased knowledge of use of DME       PT Treatment Interventions DME instruction;Functional mobility training;Balance training;Patient/family education;Gait training;Therapeutic activities;Stair training;Therapeutic exercise    PT Goals (Current goals can be found in the Care Plan section)  Acute Rehab PT Goals Patient Stated Goal: to get wlaking independent and get back to her [place] house at [facility] PT Goal Formulation: With patient Time For Goal Achievement: 10/10/19 Potential to  Achieve Goals: Good    Frequency 7X/week    AM-PAC PT "6 Clicks" Mobility  Outcome Measure Help needed turning from your back to your side while in a flat bed without using bedrails?: None Help needed moving from lying on your back to sitting on the side of a flat bed without using bedrails?: A Little Help needed moving to and from a bed to a chair (including a wheelchair)?: A Little Help needed standing up from a chair using your arms (e.g., wheelchair or bedside chair)?: A Little Help needed to walk in hospital room?: A Little Help needed climbing 3-5 steps with a railing? : A Little 6 Click Score: 19    End of Session Equipment Utilized During Treatment: Gait belt Activity Tolerance: Patient tolerated treatment well Patient left: in bed;with call bell/phone within reach Nurse Communication: Mobility status PT Visit Diagnosis: Muscle weakness (generalized) (M62.81);Difficulty in walking, not elsewhere classified (R26.2)    Time: 0623-7628 PT Time Calculation (min) (ACUTE ONLY): 38 min   Charges:   PT Evaluation $PT Eval Low Complexity: 1 Low PT Treatments $Gait Training: 8-22 mins $Therapeutic Exercise: 8-22 mins       Verner Mould, DPT Physical Therapist with [facility] [phone]  10/03/2019 3:09 PM

## 2019-10-03 NOTE — Anesthesia Postprocedure Evaluation (Signed)
Anesthesia Post Note  Patient: Julie Day  Procedure(s) Performed: RIGHT TOTAL KNEE ARTHROPLASTY (Right Knee)     Patient location during evaluation: Nursing Unit Anesthesia Type: Spinal Level of consciousness: oriented and awake and alert Pain management: pain level controlled Vital Signs Assessment: post-procedure vital signs reviewed and stable Respiratory status: spontaneous breathing and respiratory function stable Cardiovascular status: blood pressure returned to baseline and stable Postop Assessment: no headache, no backache, no apparent nausea or vomiting and patient able to bend at knees Anesthetic complications: no    Last Vitals:  Vitals:   10/03/19 1130 10/03/19 1145  BP: 105/63 98/62  Pulse: 74 68  Resp: 14 15  Temp:    SpO2: 95% 93%    Last Pain:  Vitals:   10/03/19 1145  TempSrc:   PainSc: 0-No pain                 Barnet Glasgow

## 2019-10-03 NOTE — Op Note (Signed)
PREOP DIAGNOSIS: DJD RIGHT KNEE POSTOP DIAGNOSIS: same PROCEDURE: RIGHT TKR ANESTHESIA: Spinal and MAC ATTENDING SURGEON: Hessie Dibble ASSISTANT: Loni Dolly PA  INDICATIONS FOR PROCEDURE: Julie Day is a 74 y.o. female who has struggled for a long time with pain due to degenerative arthritis of the right knee.  The patient has failed many conservative non-operative measures and at this point has pain which limits the ability to sleep and walk.  The patient is offered total knee replacement.  Informed operative consent was obtained after discussion of possible risks of anesthesia, infection, neurovascular injury, DVT, and death.  The importance of the post-operative rehabilitation protocol to optimize result was stressed extensively with the patient.  SUMMARY OF FINDINGS AND PROCEDURE:  Julie Day was taken to the operative suite where under the above anesthesia a right knee replacement was performed.  There were advanced degenerative changes and the bone quality was good.  We used the DePuy Attune system and placed size 4 femur, 4 tibia, 38 mm all polyethylene patella, and a size 6 mm spacer.  Loni Dolly PA-C assisted throughout and was invaluable to the completion of the case in that he helped retract and maintain exposure while I placed components.  He also helped close thereby minimizing OR time.  The patient was admitted for appropriate post-op care to include perioperative antibiotics and mechanical and pharmacologic measures for DVT prophylaxis.  DESCRIPTION OF PROCEDURE:  Julie Day was taken to the operative suite where the above anesthesia was applied.  The patient was positioned supine and prepped and draped in normal sterile fashion.  An appropriate time out was performed.  After the administration of kefzol pre-op antibiotic the leg was elevated and exsanguinated and a tourniquet inflated. A standard longitudinal incision was made on the anterior knee.  Dissection was carried down  to the extensor mechanism.  All appropriate anti-infective measures were used including the pre-operative antibiotic, betadine impregnated drape, and closed hooded exhaust systems for each member of the surgical team.  A medial parapatellar incision was made in the extensor mechanism and the knee cap flipped and the knee flexed.  Some residual meniscal tissues were removed along with any remaining ACL/PCL tissue.  A guide was placed on the tibia and a flat cut was made on it's superior surface.  An intramedullary guide was placed in the femur and was utilized to make anterior and posterior cuts creating an appropriate flexion gap.  A second intramedullary guide was placed in the femur to make a distal cut properly balancing the knee with an extension gap equal to the flexion gap.  The three bones sized to the above mentioned sizes and the appropriate guides were placed and utilized.  A trial reduction was done and the knee easily came to full extension and the patella tracked well on flexion.  The trial components were removed and all bones were cleaned with pulsatile lavage and then dried thoroughly.  Cement was mixed and was pressurized onto the bones followed by placement of the aforementioned components.  Excess cement was trimmed and pressure was held on the components until the cement had hardened.  The tourniquet was deflated and a small amount of bleeding was controlled with cautery and pressure.  The knee was irrigated thoroughly.  The extensor mechanism was re-approximated with #1 ethibond in interrupted fashion.  The knee was flexed and the repair was solid.  The subcutaneous tissues were re-approximated with #0 and #2-0 vicryl and the skin closed with a subcuticular stitch  and steristrips.  A sterile dressing was applied.  Intraoperative fluids, EBL, and tourniquet time can be obtained from anesthesia records.  DISPOSITION:  The patient was taken to recovery room in stable condition and admitted for  appropriate post-op care to include peri-operative antibiotic and DVT prophylaxis with mechanical and pharmacologic measures.  Hessie Dibble 10/03/2019, 10:34 AM

## 2019-10-03 NOTE — Progress Notes (Signed)
AssistedDr. Valma Cava with right, ultrasound guided adductor canal block. Side rails up, monitors on throughout procedure. See vital signs in flow sheet. Tolerated Procedure well.

## 2019-10-03 NOTE — Transfer of Care (Signed)
Immediate Anesthesia Transfer of Care Note  Patient: Julie Day  Procedure(s) Performed: RIGHT TOTAL KNEE ARTHROPLASTY (Right Knee)  Patient Location: PACU  Anesthesia Type:Spinal  Level of Consciousness: awake, alert , oriented and patient cooperative  Airway & Oxygen Therapy: Patient Spontanous Breathing and Patient connected to face mask oxygen  Post-op Assessment: Report given to RN and Post -op Vital signs reviewed and stable  Post vital signs: Reviewed and stable  Last Vitals:  Vitals Value Taken Time  BP 104/58 10/03/19 1100  Temp    Pulse 75 10/03/19 1101  Resp 18 10/03/19 1101  SpO2 100 % 10/03/19 1101  Vitals shown include unvalidated device data.  Last Pain:  Vitals:   10/03/19 0800  TempSrc:   PainSc: 0-No pain         Complications: No apparent anesthesia complications

## 2019-10-04 ENCOUNTER — Encounter: Payer: Self-pay | Admitting: *Deleted

## 2019-10-04 DIAGNOSIS — Z87891 Personal history of nicotine dependence: Secondary | ICD-10-CM | POA: Diagnosis not present

## 2019-10-04 DIAGNOSIS — M519 Unspecified thoracic, thoracolumbar and lumbosacral intervertebral disc disorder: Secondary | ICD-10-CM | POA: Diagnosis not present

## 2019-10-04 DIAGNOSIS — I10 Essential (primary) hypertension: Secondary | ICD-10-CM | POA: Diagnosis not present

## 2019-10-04 DIAGNOSIS — Z471 Aftercare following joint replacement surgery: Secondary | ICD-10-CM | POA: Diagnosis not present

## 2019-10-04 DIAGNOSIS — K219 Gastro-esophageal reflux disease without esophagitis: Secondary | ICD-10-CM | POA: Diagnosis not present

## 2019-10-04 DIAGNOSIS — D1803 Hemangioma of intra-abdominal structures: Secondary | ICD-10-CM | POA: Diagnosis not present

## 2019-10-04 DIAGNOSIS — Z96651 Presence of right artificial knee joint: Secondary | ICD-10-CM | POA: Diagnosis not present

## 2019-10-04 DIAGNOSIS — Z8719 Personal history of other diseases of the digestive system: Secondary | ICD-10-CM | POA: Diagnosis not present

## 2019-10-04 DIAGNOSIS — N3020 Other chronic cystitis without hematuria: Secondary | ICD-10-CM | POA: Diagnosis not present

## 2019-10-04 DIAGNOSIS — M353 Polymyalgia rheumatica: Secondary | ICD-10-CM | POA: Diagnosis not present

## 2019-10-05 DIAGNOSIS — Z96651 Presence of right artificial knee joint: Secondary | ICD-10-CM | POA: Diagnosis not present

## 2019-10-05 DIAGNOSIS — K219 Gastro-esophageal reflux disease without esophagitis: Secondary | ICD-10-CM | POA: Diagnosis not present

## 2019-10-05 DIAGNOSIS — D1803 Hemangioma of intra-abdominal structures: Secondary | ICD-10-CM | POA: Diagnosis not present

## 2019-10-05 DIAGNOSIS — Z8719 Personal history of other diseases of the digestive system: Secondary | ICD-10-CM | POA: Diagnosis not present

## 2019-10-05 DIAGNOSIS — M519 Unspecified thoracic, thoracolumbar and lumbosacral intervertebral disc disorder: Secondary | ICD-10-CM | POA: Diagnosis not present

## 2019-10-05 DIAGNOSIS — M353 Polymyalgia rheumatica: Secondary | ICD-10-CM | POA: Diagnosis not present

## 2019-10-05 DIAGNOSIS — Z471 Aftercare following joint replacement surgery: Secondary | ICD-10-CM | POA: Diagnosis not present

## 2019-10-05 DIAGNOSIS — Z87891 Personal history of nicotine dependence: Secondary | ICD-10-CM | POA: Diagnosis not present

## 2019-10-05 DIAGNOSIS — I10 Essential (primary) hypertension: Secondary | ICD-10-CM | POA: Diagnosis not present

## 2019-10-05 DIAGNOSIS — N3020 Other chronic cystitis without hematuria: Secondary | ICD-10-CM | POA: Diagnosis not present

## 2019-10-06 DIAGNOSIS — Z96651 Presence of right artificial knee joint: Secondary | ICD-10-CM | POA: Diagnosis not present

## 2019-10-06 DIAGNOSIS — Z8719 Personal history of other diseases of the digestive system: Secondary | ICD-10-CM | POA: Diagnosis not present

## 2019-10-06 DIAGNOSIS — D1803 Hemangioma of intra-abdominal structures: Secondary | ICD-10-CM | POA: Diagnosis not present

## 2019-10-06 DIAGNOSIS — K219 Gastro-esophageal reflux disease without esophagitis: Secondary | ICD-10-CM | POA: Diagnosis not present

## 2019-10-06 DIAGNOSIS — M353 Polymyalgia rheumatica: Secondary | ICD-10-CM | POA: Diagnosis not present

## 2019-10-06 DIAGNOSIS — M519 Unspecified thoracic, thoracolumbar and lumbosacral intervertebral disc disorder: Secondary | ICD-10-CM | POA: Diagnosis not present

## 2019-10-06 DIAGNOSIS — Z87891 Personal history of nicotine dependence: Secondary | ICD-10-CM | POA: Diagnosis not present

## 2019-10-06 DIAGNOSIS — I10 Essential (primary) hypertension: Secondary | ICD-10-CM | POA: Diagnosis not present

## 2019-10-06 DIAGNOSIS — N3020 Other chronic cystitis without hematuria: Secondary | ICD-10-CM | POA: Diagnosis not present

## 2019-10-06 DIAGNOSIS — Z471 Aftercare following joint replacement surgery: Secondary | ICD-10-CM | POA: Diagnosis not present

## 2019-10-09 DIAGNOSIS — Z96651 Presence of right artificial knee joint: Secondary | ICD-10-CM | POA: Diagnosis not present

## 2019-10-09 DIAGNOSIS — Z87891 Personal history of nicotine dependence: Secondary | ICD-10-CM | POA: Diagnosis not present

## 2019-10-09 DIAGNOSIS — M353 Polymyalgia rheumatica: Secondary | ICD-10-CM | POA: Diagnosis not present

## 2019-10-09 DIAGNOSIS — D1803 Hemangioma of intra-abdominal structures: Secondary | ICD-10-CM | POA: Diagnosis not present

## 2019-10-09 DIAGNOSIS — Z8719 Personal history of other diseases of the digestive system: Secondary | ICD-10-CM | POA: Diagnosis not present

## 2019-10-09 DIAGNOSIS — K219 Gastro-esophageal reflux disease without esophagitis: Secondary | ICD-10-CM | POA: Diagnosis not present

## 2019-10-09 DIAGNOSIS — Z471 Aftercare following joint replacement surgery: Secondary | ICD-10-CM | POA: Diagnosis not present

## 2019-10-09 DIAGNOSIS — N3020 Other chronic cystitis without hematuria: Secondary | ICD-10-CM | POA: Diagnosis not present

## 2019-10-09 DIAGNOSIS — I10 Essential (primary) hypertension: Secondary | ICD-10-CM | POA: Diagnosis not present

## 2019-10-09 DIAGNOSIS — M519 Unspecified thoracic, thoracolumbar and lumbosacral intervertebral disc disorder: Secondary | ICD-10-CM | POA: Diagnosis not present

## 2019-10-11 DIAGNOSIS — Z471 Aftercare following joint replacement surgery: Secondary | ICD-10-CM | POA: Diagnosis not present

## 2019-10-11 DIAGNOSIS — D1803 Hemangioma of intra-abdominal structures: Secondary | ICD-10-CM | POA: Diagnosis not present

## 2019-10-11 DIAGNOSIS — M353 Polymyalgia rheumatica: Secondary | ICD-10-CM | POA: Diagnosis not present

## 2019-10-11 DIAGNOSIS — I10 Essential (primary) hypertension: Secondary | ICD-10-CM | POA: Diagnosis not present

## 2019-10-11 DIAGNOSIS — Z96651 Presence of right artificial knee joint: Secondary | ICD-10-CM | POA: Diagnosis not present

## 2019-10-11 DIAGNOSIS — N3020 Other chronic cystitis without hematuria: Secondary | ICD-10-CM | POA: Diagnosis not present

## 2019-10-11 DIAGNOSIS — K219 Gastro-esophageal reflux disease without esophagitis: Secondary | ICD-10-CM | POA: Diagnosis not present

## 2019-10-11 DIAGNOSIS — M519 Unspecified thoracic, thoracolumbar and lumbosacral intervertebral disc disorder: Secondary | ICD-10-CM | POA: Diagnosis not present

## 2019-10-11 DIAGNOSIS — Z8719 Personal history of other diseases of the digestive system: Secondary | ICD-10-CM | POA: Diagnosis not present

## 2019-10-11 DIAGNOSIS — Z87891 Personal history of nicotine dependence: Secondary | ICD-10-CM | POA: Diagnosis not present

## 2019-10-12 ENCOUNTER — Ambulatory Visit: Payer: Medicare HMO | Attending: Orthopaedic Surgery | Admitting: Physical Therapy

## 2019-10-12 ENCOUNTER — Other Ambulatory Visit: Payer: Self-pay

## 2019-10-12 ENCOUNTER — Encounter: Payer: Self-pay | Admitting: Physical Therapy

## 2019-10-12 DIAGNOSIS — M25661 Stiffness of right knee, not elsewhere classified: Secondary | ICD-10-CM

## 2019-10-12 DIAGNOSIS — G8929 Other chronic pain: Secondary | ICD-10-CM

## 2019-10-12 DIAGNOSIS — M6281 Muscle weakness (generalized): Secondary | ICD-10-CM | POA: Diagnosis present

## 2019-10-12 DIAGNOSIS — M25561 Pain in right knee: Secondary | ICD-10-CM | POA: Diagnosis not present

## 2019-10-12 DIAGNOSIS — R600 Localized edema: Secondary | ICD-10-CM | POA: Diagnosis present

## 2019-10-12 NOTE — Therapy (Signed)
[facility] [place], [place], [address] Phone: [phone]   Fax:  [phone]  Physical Therapy Evaluation  Patient Details  Name: Julie Day MRN: [id number] Date of Birth: 1945/11/05 Referring Provider (PT): Melrose Nakayama MD   Encounter Date: 10/12/2019  PT End of Session - 10/12/19 1203    Visit Number  1    Number of Visits  12    Date for PT Re-Evaluation  01/10/20    Authorization Type  FOTO.    PT Start Time  1034    PT Stop Time  1120    PT Time Calculation (min)  46 min    Activity Tolerance  Patient tolerated treatment well    Behavior During Therapy  WFL for tasks assessed/performed       Past Medical History:  Diagnosis Date  . Allergic rhinitis   . Anginal pain ([place])    Dx as GERD  . Atypical mole 09/23/2017   Left Inner Thigh-Mild  . Atypical mole 09/23/2017   Right Axilla-Mild  . Atypical mole 09/05/2015   Center Abdomen-Severe (clear) (Dr. Nevada Crane)  . Atypical mole 04/14/2019   Right Thigh-Solar Lentigo  . Chronic cystitis   . Degenerative disc disease   . GERD (gastroesophageal reflux disease)   . Hemangioma of liver   . History of hiatal hernia 2017  . Hypertension   . SCC (squamous cell carcinoma) 09/05/2015   Left Dorsal Hand-Well Diff Keratoacanthoma (Dr. Nevada Crane) (free)  . SCC (squamous cell carcinoma) 04/14/2019   Left Outer Shin-Keratoacanthoma (treatment after biopsy)  . SCC (squamous cell carcinoma) 07/21/2019   Right Thigh-Keratoacanthoma (treatment Fluorouracil Cream)  . Squamous cell carcinoma of skin 09/23/2017   Right Upper Shin-Well Diff (Cx3,5FU)    Past Surgical History:  Procedure Laterality Date  . ABDOMINAL HYSTERECTOMY    . CHOLECYSTECTOMY    . COLON RESECTION N/A 05/31/2015   Procedure: LAPAROSCOPIC REPAIR OF HIATAL HERNIA  AND LAPAROSCOPIC NISSEN FUNDOPLICATION;  Surgeon: Fanny Skates, MD;  Location: [facility];  Service: General;  Laterality: N/A;  . ESOPHAGEAL  MANOMETRY N/A 02/27/2015   Procedure: ESOPHAGEAL MANOMETRY (EM);  Surgeon: Garlan Fair, MD;  Location: [facility];  Service: Endoscopy;  Laterality: N/A;  . ESOPHAGOGASTRODUODENOSCOPY (EGD) WITH PROPOFOL N/A 02/26/2015   Procedure: ESOPHAGOGASTRODUODENOSCOPY (EGD) WITH PROPOFOL;  Surgeon: Garlan Fair, MD;  Location: [facility];  Service: Endoscopy;  Laterality: N/A;  . HIATAL HERNIA REPAIR  05/31/2015  . TOTAL KNEE ARTHROPLASTY Right 10/03/2019   Procedure: RIGHT TOTAL KNEE ARTHROPLASTY;  Surgeon: Melrose Nakayama, MD;  Location: [facility];  Service: Orthopedics;  Laterality: Right;    There were no vitals filed for this visit.   Subjective Assessment - 10/12/19 1052    Subjective  COVID-19 screen performed prior to patient entering clinic.  The patient underwent a right total knee replacement on 10/03/19.  She has had some home health physical therapy and has been complinat to her HEP.  Her resting pain-level is a 6/10 and higher with bending.  She is using a FWW for safety.  She is compliant to using TED hose as well.  She is doing a HEP.    Pertinent History  H/o LBP, HTN, DDD.    How long can you walk comfortably?  Around hous ewith walker.    Patient Stated Goals  Get back to normal.    Currently in Pain?  Yes    Pain Location  Knee    Pain Orientation  Right  Pain Descriptors / Indicators  Aching    Pain Type  Surgical pain    Pain Onset  1 to 4 weeks ago    Pain Frequency  Constant    Aggravating Factors   Bending knee.    Pain Relieving Factors  Ice and rest.         [facility] PT Assessment - 10/12/19 0001      Assessment   Medical Diagnosis  S/p right total knee replacement.    Referring Provider (PT)  Melrose Nakayama MD    Onset Date/Surgical Date  --   10/03/19 (surgery date).     Precautions   Precautions  --   No ultrasound.     Restrictions   Weight Bearing Restrictions  No      Balance Screen   Has the patient fallen in the past 6 months  No    Has the  patient had a decrease in activity level because of a fear of falling?   No    Is the patient reluctant to leave their home because of a fear of falling?   No      Home Environment   Living Environment  Private residence      Prior Function   Level of Independence  Independent      Observation/Other Assessments   Observations  Aquacel intact and TED hose donned.    Focus on Therapeutic Outcomes (FOTO)   72% limitation.      Observation/Other Assessments-Edema    Edema  Circumferential      Circumferential Edema   Circumferential - Right  RT 6 cms > LT      ROM / Strength   AROM / PROM / Strength  AROM;Strength      AROM   Overall AROM Comments  -5 of active right knee extension and full passive with flexion to 87 degrees.      Strength   Overall Strength Comments  RT hip= 2+ to 3-/5 and knee extension= 3-/5.      Palpation   Palpation comment  C/o diffuse anterior right knee pain.      Ambulation/Gait   Gait Comments  Antalgic gait pattern with FWW.                Objective measurements completed on examination: See above findings.      [facility] Adult PT Treatment/Exercise - 10/12/19 0001      Exercises   Exercises  Knee/Hip      Knee/Hip Exercises: Aerobic   Nustep  Level 1 x 10 minutes moving seat forward x 1 to increase knee flexion.      Modalities   Modalities  Vasopneumatic      Vasopneumatic   Number Minutes Vasopneumatic   10 minutes    Vasopnuematic Location   --   Right knee.   Vasopneumatic Pressure  Low               PT Short Term Goals - 10/12/19 1211      PT SHORT TERM GOAL #1   Title  STG's=LTG's.        PT Long Term Goals - 10/12/19 1211      PT LONG TERM GOAL #1   Title  Patient will be independent with HEP    Time  6    Period  Weeks    Status  New      PT LONG TERM GOAL #2   Title  Active right knee flexion to  115 degrees+ so the patient can perform functional tasks and do so with pain not > 2-3/10.    Time   6    Period  Weeks    Status  New      PT LONG TERM GOAL #3   Title  Increase right knee strength to a solid 4+/5 to provide good stability for accomplishment of functional activities.    Time  6    Period  Weeks    Status  New      PT LONG TERM GOAL #4   Title  Full active knee extension in order to normalize gait.    Time  6    Period  Weeks    Status  New      PT LONG TERM GOAL #5   Title  Perform a reciprocating stair gait with one railing with pain not > 2-3/10.    Time  6    Period  Weeks    Status  New             Plan - 10/12/19 1205    Clinical Impression Statement  The patient presents to OPPT s/p right TKA performed on 10/03/19.  She presents to the clinic today ambulating with a FWW.   She has TED hose donned and her Aquacel is intact.  She has expected losses of range of motion and strength at this time.  She has a moderate+ edema.  Her fucntional mobility is currently impaired and she demonstrated a FOTO limitation score of 72%. Patient will benefit from skilled physical therapy intervention to address deficits and pain.    Personal Factors and Comorbidities  Comorbidity 1;Comorbidity 2    Comorbidities  H/o LBP, HTN, DDD.    Examination-Activity Limitations  Locomotion Level;Stairs;Other    Examination-Participation Restrictions  Other    Stability/Clinical Decision Making  Stable/Uncomplicated    Rehab Potential  Excellent    PT Frequency  3x / week    PT Duration  4 weeks       Patient will benefit from skilled therapeutic intervention in order to improve the following deficits and impairments:  Pain, Abnormal gait, Decreased activity tolerance, Decreased range of motion, Decreased mobility, Decreased strength, Increased edema  Visit Diagnosis: Chronic pain of right knee - Plan: PT plan of care cert/re-cert  Stiffness of right knee, not elsewhere classified - Plan: PT plan of care cert/re-cert  Localized edema  Muscle weakness (generalized) - Plan:  PT plan of care cert/re-cert     Problem List Patient Active Problem List   Diagnosis Date Noted  . Primary localized osteoarthritis of right knee 10/03/2019  . 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

## 2019-10-13 ENCOUNTER — Ambulatory Visit: Payer: Medicare HMO | Admitting: Physical Therapy

## 2019-10-13 ENCOUNTER — Other Ambulatory Visit: Payer: Self-pay

## 2019-10-13 DIAGNOSIS — M25561 Pain in right knee: Secondary | ICD-10-CM | POA: Diagnosis not present

## 2019-10-13 DIAGNOSIS — R600 Localized edema: Secondary | ICD-10-CM

## 2019-10-13 DIAGNOSIS — G8929 Other chronic pain: Secondary | ICD-10-CM

## 2019-10-13 DIAGNOSIS — M25661 Stiffness of right knee, not elsewhere classified: Secondary | ICD-10-CM | POA: Diagnosis not present

## 2019-10-13 DIAGNOSIS — M6281 Muscle weakness (generalized): Secondary | ICD-10-CM | POA: Diagnosis not present

## 2019-10-13 NOTE — Therapy (Signed)
[facility] [place], [place], [address] Phone: [phone]   Fax:  [phone]  Physical Therapy Treatment  Patient Details  Name: Julie Day MRN: [id number] Date of Birth: [birth year] Referring Provider (PT): Melrose Nakayama MD   Encounter Date: 10/13/2019  PT End of Session - 10/13/19 1321    Visit Number  2    Number of Visits  12    Date for PT Re-Evaluation  01/10/20    Authorization Type  FOTO.    PT Start Time  1115    PT Stop Time  1207    PT Time Calculation (min)  52 min    Activity Tolerance  Patient tolerated treatment well    Behavior During Therapy  WFL for tasks assessed/performed       Past Medical History:  Diagnosis Date  . Allergic rhinitis   . Anginal pain ([place])    Dx as GERD  . Atypical mole 09/23/2017   Left Inner Thigh-Mild  . Atypical mole 09/23/2017   Right Axilla-Mild  . Atypical mole 09/05/2015   Center Abdomen-Severe (clear) (Dr. Nevada Crane)  . Atypical mole 04/14/2019   Right Thigh-Solar Lentigo  . Chronic cystitis   . Degenerative disc disease   . GERD (gastroesophageal reflux disease)   . Hemangioma of liver   . History of hiatal hernia 2017  . Hypertension   . SCC (squamous cell carcinoma) 09/05/2015   Left Dorsal Hand-Well Diff Keratoacanthoma (Dr. [place] Crane) (free)  . SCC (squamous cell carcinoma) 04/14/2019   Left Outer Shin-Keratoacanthoma (treatment after biopsy)  . SCC (squamous cell carcinoma) 07/21/2019   Right Thigh-Keratoacanthoma (treatment Fluorouracil Cream)  . Squamous cell carcinoma of skin 09/23/2017   Right Upper Shin-Well Diff (Cx3,5FU)    Past Surgical History:  Procedure Laterality Date  . ABDOMINAL HYSTERECTOMY    . CHOLECYSTECTOMY    . COLON RESECTION N/A 05/31/2015   Procedure: LAPAROSCOPIC REPAIR OF HIATAL HERNIA  AND LAPAROSCOPIC NISSEN FUNDOPLICATION;  Surgeon: Fanny Skates, MD;  Location: [facility];  Service: General;  Laterality: N/A;  . ESOPHAGEAL  MANOMETRY N/A 02/27/2015   Procedure: ESOPHAGEAL MANOMETRY (EM);  Surgeon: Garlan Fair, MD;  [facility]: [facility];  Service: Endoscopy;  Laterality: N/A;  . ESOPHAGOGASTRODUODENOSCOPY (EGD) WITH PROPOFOL N/A 02/26/2015   Procedure: ESOPHAGOGASTRODUODENOSCOPY (EGD) WITH PROPOFOL;  Surgeon: Garlan Fair, MD;  [facility];  Service: Endoscopy;  Laterality: N/A;  . HIATAL HERNIA REPAIR  05/31/2015  . TOTAL KNEE ARTHROPLASTY Right 10/03/2019   Procedure: RIGHT TOTAL KNEE ARTHROPLASTY;  Surgeon: Melrose Nakayama, MD;  Location: [facility];  Service: Orthopedics;  Laterality: Right;    There were no vitals filed for this visit.  Subjective Assessment - 10/13/19 1321    Subjective  COVID-19 screen performed prior to patient entering clinic.  No new complaints.    Pertinent History  H/o LBP, HTN, DDD.    How long can you walk comfortably?  Around house with walker.    Patient Stated Goals  Get back to normal.    Currently in Pain?  Yes    Pain Score  2     Pain Location  Knee    Pain Orientation  Right    Pain Descriptors / Indicators  Aching    Pain Type  Surgical pain    Pain Onset  1 to 4 weeks ago                       [facility] Adult  PT Treatment/Exercise - 10/13/19 0001      Exercises   Exercises  Knee/Hip      Knee/Hip Exercises: Aerobic   Nustep  Level 1 x 15 minutes moving seat forwadr x 2 to increase flexion.      Knee/Hip Exercises: Supine   Short Arc Quad Sets Limitations  Assisted non-resisted right SAQ's facilitated with Bi-phasic e'stim x 10 minutes with 10 sec extension holds f/b a 10 sec rest.      Modalities   Modalities  Vasopneumatic      Vasopneumatic   Number Minutes Vasopneumatic   15 minutes    Vasopnuematic Location   --   Right knee.   Vasopneumatic Pressure  Low      Manual Therapy   Manual Therapy  Passive ROM    Passive ROM  In supine:  Right knee passive range of motion x 5 minutes.               PT Short Term  Goals - 10/12/19 1211      PT SHORT TERM GOAL #1   Title  STG's=LTG's.        PT Long Term Goals - 10/12/19 1211      PT LONG TERM GOAL #1   Title  Patient will be independent with HEP    Time  6    Period  Weeks    Status  New      PT LONG TERM GOAL #2   Title  Active right knee flexion to 115 degrees+ so the patient can perform functional tasks and do so with pain not > 2-3/10.    Time  6    Period  Weeks    Status  New      PT LONG TERM GOAL #3   Title  Increase right knee strength to a solid 4+/5 to provide good stability for accomplishment of functional activities.    Time  6    Period  Weeks    Status  New      PT LONG TERM GOAL #4   Title  Full active knee extension in order to normalize gait.    Time  6    Period  Weeks    Status  New      PT LONG TERM GOAL #5   Title  Perform a reciprocating stair gait with one railing with pain not > 2-3/10.    Time  6    Period  Weeks    Status  New            Plan - 10/13/19 1324    Clinical Impression Statement  Patient did very well today with treatment.  Passive right knee flexion to 100 degrees.  Quads remain weak but she can achieve full extension wiht assist.    Personal Factors and Comorbidities  Comorbidity 1;Comorbidity 2    Comorbidities  H/o LBP, HTN, DDD.    Examination-Activity Limitations  Locomotion Level;Stairs;Other    Examination-Participation Restrictions  Other    Stability/Clinical Decision Making  Stable/Uncomplicated    Rehab Potential  Excellent    PT Frequency  3x / week    PT Duration  4 weeks       Patient will benefit from skilled therapeutic intervention in order to improve the following deficits and impairments:  Pain, Abnormal gait, Decreased activity tolerance, Decreased range of motion, Decreased mobility, Decreased strength, Increased edema  Visit Diagnosis: Chronic pain of right knee  Stiffness of right knee,  not elsewhere classified  Localized edema     Problem  List Patient Active Problem List   Diagnosis Date Noted  . Primary localized osteoarthritis of right knee 10/03/2019  . 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

## 2019-10-16 ENCOUNTER — Ambulatory Visit: Payer: Medicare HMO | Admitting: Physical Therapy

## 2019-10-16 ENCOUNTER — Encounter: Payer: Self-pay | Admitting: Physical Therapy

## 2019-10-16 DIAGNOSIS — R600 Localized edema: Secondary | ICD-10-CM

## 2019-10-16 DIAGNOSIS — Z96651 Presence of right artificial knee joint: Secondary | ICD-10-CM | POA: Diagnosis not present

## 2019-10-16 DIAGNOSIS — M6281 Muscle weakness (generalized): Secondary | ICD-10-CM

## 2019-10-16 DIAGNOSIS — G8929 Other chronic pain: Secondary | ICD-10-CM

## 2019-10-16 DIAGNOSIS — M25661 Stiffness of right knee, not elsewhere classified: Secondary | ICD-10-CM

## 2019-10-16 DIAGNOSIS — M25561 Pain in right knee: Secondary | ICD-10-CM | POA: Diagnosis not present

## 2019-10-16 NOTE — Therapy (Signed)
[facility] [place], [place], [address] Phone: [phone]   Fax:  [phone]  Physical Therapy Treatment  Patient Details  Name: Julie Day MRN: [id number] Date of Birth: [birth year] Referring Provider (PT): Melrose Nakayama MD   Encounter Date: 10/16/2019  PT End of Session - 10/16/19 1508    Visit Number  3    Number of Visits  12    Date for PT Re-Evaluation  01/10/20    Authorization Type  FOTO.    PT Start Time  1432    PT Stop Time  1522    PT Time Calculation (min)  50 min    Activity Tolerance  Patient tolerated treatment well    Behavior During Therapy  WFL for tasks assessed/performed       Past Medical History:  Diagnosis Date  . Allergic rhinitis   . Anginal pain ([place])    Dx as GERD  . Atypical mole 09/23/2017   Left Inner Thigh-Mild  . Atypical mole 09/23/2017   Right Axilla-Mild  . Atypical mole 09/05/2015   Center Abdomen-Severe (clear) (Dr. [place] Crane)  . Atypical mole 04/14/2019   Right Thigh-Solar Lentigo  . Chronic cystitis   . Degenerative disc disease   . GERD (gastroesophageal reflux disease)   . Hemangioma of liver   . History of hiatal hernia 2017  . Hypertension   . SCC (squamous cell carcinoma) 09/05/2015   Left Dorsal Hand-Well Diff Keratoacanthoma (Dr. [place] Crane) (free)  . SCC (squamous cell carcinoma) 04/14/2019   Left Outer Shin-Keratoacanthoma (treatment after biopsy)  . SCC (squamous cell carcinoma) 07/21/2019   Right Thigh-Keratoacanthoma (treatment Fluorouracil Cream)  . Squamous cell carcinoma of skin 09/23/2017   Right Upper Shin-Well Diff (Cx3,5FU)    Past Surgical History:  Procedure Laterality Date  . ABDOMINAL HYSTERECTOMY    . CHOLECYSTECTOMY    . COLON RESECTION N/A 05/31/2015   Procedure: LAPAROSCOPIC REPAIR OF HIATAL HERNIA  AND LAPAROSCOPIC NISSEN FUNDOPLICATION;  Surgeon: Fanny Skates, MD;  Location: [facility];  Service: General;  Laterality: N/A;  . ESOPHAGEAL  MANOMETRY N/A 02/27/2015   Procedure: ESOPHAGEAL MANOMETRY (EM);  Surgeon: Garlan Fair, MD;  [facility];  Service: Endoscopy;  Laterality: N/A;  . ESOPHAGOGASTRODUODENOSCOPY (EGD) WITH PROPOFOL N/A 02/26/2015   Procedure: ESOPHAGOGASTRODUODENOSCOPY (EGD) WITH PROPOFOL;  Surgeon: Garlan Fair, MD;  [facility];  Service: Endoscopy;  Laterality: N/A;  . HIATAL HERNIA REPAIR  05/31/2015  . TOTAL KNEE ARTHROPLASTY Right 10/03/2019   Procedure: RIGHT TOTAL KNEE ARTHROPLASTY;  Surgeon: Melrose Nakayama, MD;  [facility];  Service: Orthopedics;  Laterality: Right;    There were no vitals filed for this visit.  Subjective Assessment - 10/16/19 1436    Subjective  COVID-19 screen performed prior to patient entering clinic. Reports that MD was very pleased with her knee after two weeks. Reports that she has had increased pain since yesterday evening. Has pain at times down into R ankle.    Pertinent History  H/o LBP, HTN, DDD.    How long can you walk comfortably?  Around house with walker.    Patient Stated Goals  Get back to normal.    Currently in Pain?  Yes    Pain Score  7     Pain Location  Knee    Pain Orientation  Right    Pain Descriptors / Indicators  Burning;Throbbing;Sharp    Pain Type  Surgical pain    Pain Onset  1 to 4  weeks ago    Pain Frequency  Constant         [facility] PT Assessment - 10/16/19 0001      Assessment   Medical Diagnosis  S/p right total knee replacement.    Referring Provider (PT)  Melrose Nakayama MD    Onset Date/Surgical Date  10/03/19      Restrictions   Weight Bearing Restrictions  No                   [facility] Adult PT Treatment/Exercise - 10/16/19 0001      Knee/Hip Exercises: Aerobic   Nustep  L3, seat 6 x15 min      Knee/Hip Exercises: Standing   Forward Lunges  Right;2 sets;10 reps;2 seconds    Hip Abduction  AROM;Right;2 sets;10 reps;Knee straight    Rocker Board  3 minutes      Knee/Hip Exercises:  Supine   Short Arc Proofreader (comment)    Short Arc Quad Sets Limitations  with russian NMR for quad activation; attempted without stimulation but too painful      Modalities   Modalities  Artist  R quad    Engineer, materials Stimulation Parameters  10/10, 50 pps, 50%, 5 sec ramp x10 min with SAQ    Electrical Stimulation Goals  Neuromuscular facilitation      Vasopneumatic   Number Minutes Vasopneumatic   10 minutes    Vasopnuematic Location   Knee    Vasopneumatic Pressure  Low    Vasopneumatic Temperature   34               PT Short Term Goals - 10/12/19 1211      PT SHORT TERM GOAL #1   Title  STG's=LTG's.        PT Long Term Goals - 10/12/19 1211      PT LONG TERM GOAL #1   Title  Patient will be independent with HEP    Time  6    Period  Weeks    Status  New      PT LONG TERM GOAL #2   Title  Active right knee flexion to 115 degrees+ so the patient can perform functional tasks and do so with pain not > 2-3/10.    Time  6    Period  Weeks    Status  New      PT LONG TERM GOAL #3   Title  Increase right knee strength to a solid 4+/5 to provide good stability for accomplishment of functional activities.    Time  6    Period  Weeks    Status  New      PT LONG TERM GOAL #4   Title  Full active knee extension in order to normalize gait.    Time  6    Period  Weeks    Status  New      PT LONG TERM GOAL #5   Title  Perform a reciprocating stair gait with one railing with pain not > 2-3/10.    Time  6    Period  Weeks    Status  New            Plan - 10/16/19 1537    Clinical Impression Statement  Patient presented in clinic with reports of increased R knee pain since yesterday but  MD pleased at visit today. Patient able to complete low level ROM and strengthening exercises with no reports of any excessive  R knee pain. Patient continues to demonstrate deficient quad actiation and greater R knee pain with knee extension. NMR of R quad completed today with SAQ to improve quad activation. Normal vasopnuematic response noted following removal of the modalities.    Personal Factors and Comorbidities  Comorbidity 1;Comorbidity 2    Comorbidities  H/o LBP, HTN, DDD.    Examination-Activity Limitations  Locomotion Level;Stairs;Other    Examination-Participation Restrictions  Other    Stability/Clinical Decision Making  Stable/Uncomplicated    Rehab Potential  Excellent    PT Frequency  3x / week    PT Duration  4 weeks    PT Next Visit Plan  Continue with quad activation training and ROM.    Consulted and Agree with Plan of Care  Patient       Patient will benefit from skilled therapeutic intervention in order to improve the following deficits and impairments:  Pain, Abnormal gait, Decreased activity tolerance, Decreased range of motion, Decreased mobility, Decreased strength, Increased edema  Visit Diagnosis: Chronic pain of right knee  Stiffness of right knee, not elsewhere classified  Localized edema  Muscle weakness (generalized)     Problem List Patient Active Problem List   Diagnosis Date Noted  . Primary localized osteoarthritis of right knee 10/03/2019  . 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

## 2019-10-18 ENCOUNTER — Other Ambulatory Visit: Payer: Self-pay

## 2019-10-18 ENCOUNTER — Ambulatory Visit: Payer: Medicare HMO | Admitting: Physical Therapy

## 2019-10-18 DIAGNOSIS — R600 Localized edema: Secondary | ICD-10-CM

## 2019-10-18 DIAGNOSIS — M25661 Stiffness of right knee, not elsewhere classified: Secondary | ICD-10-CM | POA: Diagnosis not present

## 2019-10-18 DIAGNOSIS — M6281 Muscle weakness (generalized): Secondary | ICD-10-CM | POA: Diagnosis not present

## 2019-10-18 DIAGNOSIS — M25561 Pain in right knee: Secondary | ICD-10-CM | POA: Diagnosis not present

## 2019-10-18 DIAGNOSIS — G8929 Other chronic pain: Secondary | ICD-10-CM

## 2019-10-18 NOTE — Therapy (Signed)
[facility] [place], [place], [address] Phone: [phone]   Fax:  [phone]  Physical Therapy Treatment  Patient Details  Name: Julie Day MRN: [id number] Date of Birth: [birth year] Referring Provider (PT): Melrose Nakayama MD   Encounter Date: 10/18/2019  PT End of Session - 10/18/19 1219    Visit Number  4    Number of Visits  12    Date for PT Re-Evaluation  01/10/20    Authorization Type  FOTO.    PT Start Time  1115    PT Stop Time  1212    PT Time Calculation (min)  57 min    Activity Tolerance  Patient tolerated treatment well    Behavior During Therapy  WFL for tasks assessed/performed       Past Medical History:  Diagnosis Date  . Allergic rhinitis   . Anginal pain ([place])    Dx as GERD  . Atypical mole 09/23/2017   Left Inner Thigh-Mild  . Atypical mole 09/23/2017   Right Axilla-Mild  . Atypical mole 09/05/2015   Center Abdomen-Severe (clear) (Dr. [place] Crane)  . Atypical mole 04/14/2019   Right Thigh-Solar Lentigo  . Chronic cystitis   . Degenerative disc disease   . GERD (gastroesophageal reflux disease)   . Hemangioma of liver   . History of hiatal hernia 2017  . Hypertension   . SCC (squamous cell carcinoma) 09/05/2015   Left Dorsal Hand-Well Diff Keratoacanthoma (Dr. [place] Crane) (free)  . SCC (squamous cell carcinoma) 04/14/2019   Left Outer Shin-Keratoacanthoma (treatment after biopsy)  . SCC (squamous cell carcinoma) 07/21/2019   Right Thigh-Keratoacanthoma (treatment Fluorouracil Cream)  . Squamous cell carcinoma of skin 09/23/2017   Right Upper Shin-Well Diff (Cx3,5FU)    Past Surgical History:  Procedure Laterality Date  . ABDOMINAL HYSTERECTOMY    . CHOLECYSTECTOMY    . COLON RESECTION N/A 05/31/2015   Procedure: LAPAROSCOPIC REPAIR OF HIATAL HERNIA  AND LAPAROSCOPIC NISSEN FUNDOPLICATION;  Surgeon: Fanny Skates, MD;  Location: [facility];  Service: General;  Laterality: N/A;  . ESOPHAGEAL  MANOMETRY N/A 02/27/2015   Procedure: ESOPHAGEAL MANOMETRY (EM);  Surgeon: Garlan Fair, MD;  [facility];  Service: Endoscopy;  Laterality: N/A;  . ESOPHAGOGASTRODUODENOSCOPY (EGD) WITH PROPOFOL N/A 02/26/2015   Procedure: ESOPHAGOGASTRODUODENOSCOPY (EGD) WITH PROPOFOL;  Surgeon: Garlan Fair, MD;  [facility];  Service: Endoscopy;  Laterality: N/A;  . HIATAL HERNIA REPAIR  05/31/2015  . TOTAL KNEE ARTHROPLASTY Right 10/03/2019   Procedure: RIGHT TOTAL KNEE ARTHROPLASTY;  Surgeon: Melrose Nakayama, MD;  [facility];  Service: Orthopedics;  Laterality: Right;    There were no vitals filed for this visit.  Subjective Assessment - 10/18/19 1159    Subjective  COVID-19 screen performed prior to patient entering clinic.  Doctor was very pleased.    Pertinent History  H/o LBP, HTN, DDD.    How long can you walk comfortably?  Around house with walker.    Patient Stated Goals  Get back to normal.    Currently in Pain?  Yes    Pain Score  7     Pain Location  Knee    Pain Orientation  Right    Pain Descriptors / Indicators  Aching;Throbbing    Pain Type  Surgical pain    Pain Onset  1 to 4 weeks ago         [facility] PT Assessment - 10/18/19 0001      AROM   AROM  Assessment Site  Knee    Right/Left Knee  Right    Right Knee Extension  --   Full passive right knee extension.   Right Knee Flexion  --   110 degrees.                  [facility] Adult PT Treatment/Exercise - 10/18/19 0001      Exercises   Exercises  Knee/Hip      Knee/Hip Exercises: Aerobic   Nustep  Level 3 moving forward to seat 5 x 15 minutes.      Knee/Hip Exercises: Supine   Quad Sets Limitations  Quad sets x 10 minutes facilitated with VMS to patient's right quadriceps with 10 sec extension holds and 10 sec rest.      Vasopneumatic   Number Minutes Vasopneumatic   15 minutes    Vasopnuematic Location   --   Right knee.   Vasopneumatic Pressure  Low      Manual Therapy    Manual Therapy  Passive ROM    Passive ROM  In supine:  PROM x 3 minutes into right knee flexion.               PT Short Term Goals - 10/12/19 1211      PT SHORT TERM GOAL #1   Title  STG's=LTG's.        PT Long Term Goals - 10/12/19 1211      PT LONG TERM GOAL #1   Title  Patient will be independent with HEP    Time  6    Period  Weeks    Status  New      PT LONG TERM GOAL #2   Title  Active right knee flexion to 115 degrees+ so the patient can perform functional tasks and do so with pain not > 2-3/10.    Time  6    Period  Weeks    Status  New      PT LONG TERM GOAL #3   Title  Increase right knee strength to a solid 4+/5 to provide good stability for accomplishment of functional activities.    Time  6    Period  Weeks    Status  New      PT LONG TERM GOAL #4   Title  Full active knee extension in order to normalize gait.    Time  6    Period  Weeks    Status  New      PT LONG TERM GOAL #5   Title  Perform a reciprocating stair gait with one railing with pain not > 2-3/10.    Time  6    Period  Weeks    Status  New            Plan - 10/18/19 1209    Clinical Impression Statement  Patient did an excellent job today achieving 110 degrees of right knee flexion today.    Personal Factors and Comorbidities  Comorbidity 1;Comorbidity 2    Comorbidities  H/o LBP, HTN, DDD.    Examination-Activity Limitations  Locomotion Level;Stairs;Other    Examination-Participation Restrictions  Other    Stability/Clinical Decision Making  Stable/Uncomplicated    Rehab Potential  Excellent    PT Frequency  3x / week    PT Duration  4 weeks    PT Next Visit Plan  Continue with quad activation training and ROM.    Consulted and Agree with Plan of Care  Patient       Patient will benefit from skilled therapeutic intervention in order to improve the following deficits and impairments:  Pain, Abnormal gait, Decreased activity tolerance, Decreased range of motion,  Decreased mobility, Decreased strength, Increased edema  Visit Diagnosis: Chronic pain of right knee  Stiffness of right knee, not elsewhere classified  Localized edema     Problem List Patient Active Problem List   Diagnosis Date Noted  . Primary localized osteoarthritis of right knee 10/03/2019  . 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 [address] [place], [place], [address] Phone: [phone]   Fax:  [phone]  Name: Maddix Cotherman MRN: [id number] Date of Birth: 01/15/1946

## 2019-10-20 ENCOUNTER — Other Ambulatory Visit: Payer: Self-pay

## 2019-10-20 ENCOUNTER — Ambulatory Visit: Payer: Medicare HMO | Admitting: Physical Therapy

## 2019-10-20 DIAGNOSIS — M25661 Stiffness of right knee, not elsewhere classified: Secondary | ICD-10-CM | POA: Diagnosis not present

## 2019-10-20 DIAGNOSIS — M25561 Pain in right knee: Secondary | ICD-10-CM

## 2019-10-20 DIAGNOSIS — G8929 Other chronic pain: Secondary | ICD-10-CM | POA: Diagnosis not present

## 2019-10-20 DIAGNOSIS — R600 Localized edema: Secondary | ICD-10-CM | POA: Diagnosis not present

## 2019-10-20 DIAGNOSIS — M6281 Muscle weakness (generalized): Secondary | ICD-10-CM | POA: Diagnosis not present

## 2019-10-20 NOTE — Therapy (Signed)
[facility] [place], [place], [address] Phone: [phone]   Fax:  [phone]  Physical Therapy Treatment  Patient Details  Name: Julie Day MRN: [id number] Date of Birth: 11-18-1945 Referring Provider (PT): Melrose Nakayama MD   Encounter Date: 10/20/2019  PT End of Session - 10/20/19 1254    Visit Number  5    Number of Visits  12    Date for PT Re-Evaluation  01/10/20    Authorization Type  FOTO.    PT Start Time  1115    PT Stop Time  1210    PT Time Calculation (min)  55 min    Activity Tolerance  Patient tolerated treatment well    Behavior During Therapy  WFL for tasks assessed/performed       Past Medical History:  Diagnosis Date  . Allergic rhinitis   . Anginal pain ([place])    Dx as GERD  . Atypical mole 09/23/2017   Left Inner Thigh-Mild  . Atypical mole 09/23/2017   Right Axilla-Mild  . Atypical mole 09/05/2015   Center Abdomen-Severe (clear) (Dr. Nevada Crane)  . Atypical mole 04/14/2019   Right Thigh-Solar Lentigo  . Chronic cystitis   . Degenerative disc disease   . GERD (gastroesophageal reflux disease)   . Hemangioma of liver   . History of hiatal hernia 2017  . Hypertension   . SCC (squamous cell carcinoma) 09/05/2015   Left Dorsal Hand-Well Diff Keratoacanthoma (Dr. Nevada Crane) (free)  . SCC (squamous cell carcinoma) 04/14/2019   Left Outer Shin-Keratoacanthoma (treatment after biopsy)  . SCC (squamous cell carcinoma) 07/21/2019   Right Thigh-Keratoacanthoma (treatment Fluorouracil Cream)  . Squamous cell carcinoma of skin 09/23/2017   Right Upper Shin-Well Diff (Cx3,5FU)    Past Surgical History:  Procedure Laterality Date  . ABDOMINAL HYSTERECTOMY    . CHOLECYSTECTOMY    . COLON RESECTION N/A 05/31/2015   Procedure: LAPAROSCOPIC REPAIR OF HIATAL HERNIA  AND LAPAROSCOPIC NISSEN FUNDOPLICATION;  Surgeon: Fanny Skates, MD;  Location: [place];  Service: General;  Laterality: N/A;  . ESOPHAGEAL  MANOMETRY N/A 02/27/2015   Procedure: ESOPHAGEAL MANOMETRY (EM);  Surgeon: Garlan Fair, MD;  Location: [facility];  Service: Endoscopy;  Laterality: N/A;  . ESOPHAGOGASTRODUODENOSCOPY (EGD) WITH PROPOFOL N/A 02/26/2015   Procedure: ESOPHAGOGASTRODUODENOSCOPY (EGD) WITH PROPOFOL;  Surgeon: Garlan Fair, MD;  [facility];  Service: Endoscopy;  Laterality: N/A;  . HIATAL HERNIA REPAIR  05/31/2015  . TOTAL KNEE ARTHROPLASTY Right 10/03/2019   Procedure: RIGHT TOTAL KNEE ARTHROPLASTY;  Surgeon: Melrose Nakayama, MD;  Location: [facility];  Service: Orthopedics;  Laterality: Right;    There were no vitals filed for this visit.  Subjective Assessment - 10/20/19 1254    Subjective  COVID-19 screen performed prior to patient entering clinic.  Doing well.    Pertinent History  H/o LBP, HTN, DDD.    How long can you walk comfortably?  Around house with walker.    Patient Stated Goals  Get back to normal.    Pain Score  7     Pain Location  Knee    Pain Orientation  Right    Pain Descriptors / Indicators  Aching;Throbbing    Pain Type  Surgical pain    Pain Onset  1 to 4 weeks ago                       [facility] Adult PT Treatment/Exercise - 10/20/19 0001  Exercises   Exercises  Knee/Hip      Knee/Hip Exercises: Aerobic   Recumbent Bike  Seat 3 x 7 minutes.    Nustep  Level 1 x 11 minutes.      Knee/Hip Exercises: Supine   Quad Sets Limitations  Quad sets x 11 minutes with 10 sec extension holds f/b a 10 sec rest facilitated with VMS to patient right quadriceps.      Vasopneumatic   Number Minutes Vasopneumatic   10 minutes    Vasopnuematic Location   --   Right knee.   Vasopneumatic Pressure  Low               PT Short Term Goals - 10/12/19 1211      PT SHORT TERM GOAL #1   Title  STG's=LTG's.        PT Long Term Goals - 10/12/19 1211      PT LONG TERM GOAL #1   Title  Patient will be independent with HEP    Time  6    Period  Weeks     Status  New      PT LONG TERM GOAL #2   Title  Active right knee flexion to 115 degrees+ so the patient can perform functional tasks and do so with pain not > 2-3/10.    Time  6    Period  Weeks    Status  New      PT LONG TERM GOAL #3   Title  Increase right knee strength to a solid 4+/5 to provide good stability for accomplishment of functional activities.    Time  6    Period  Weeks    Status  New      PT LONG TERM GOAL #4   Title  Full active knee extension in order to normalize gait.    Time  6    Period  Weeks    Status  New      PT LONG TERM GOAL #5   Title  Perform a reciprocating stair gait with one railing with pain not > 2-3/10.    Time  6    Period  Weeks    Status  New            Plan - 10/20/19 1258    Clinical Impression Statement  Patient able to progress to stationary bike today on seat 3 and able to make forward revolutions.    Personal Factors and Comorbidities  Comorbidity 1;Comorbidity 2    Comorbidities  H/o LBP, HTN, DDD.    Examination-Activity Limitations  Locomotion Level;Stairs;Other    Examination-Participation Restrictions  Other    Stability/Clinical Decision Making  Stable/Uncomplicated    Rehab Potential  Excellent    PT Frequency  3x / week    PT Duration  4 weeks    PT Next Visit Plan  Continue with quad activation training and ROM.    Consulted and Agree with Plan of Care  Patient       Patient will benefit from skilled therapeutic intervention in order to improve the following deficits and impairments:  Pain, Abnormal gait, Decreased activity tolerance, Decreased range of motion, Decreased mobility, Decreased strength, Increased edema  Visit Diagnosis: Chronic pain of right knee  Stiffness of right knee, not elsewhere classified  Localized edema     Problem List Patient Active Problem List   Diagnosis Date Noted  . Primary localized osteoarthritis of right knee 10/03/2019  . 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 [place], [place], [address] Phone: [phone]   Fax:  [phone]  Name: Julie Day MRN: [id number] Date of Birth: 06-01-1946

## 2019-10-23 ENCOUNTER — Ambulatory Visit: Payer: Medicare HMO | Admitting: Physical Therapy

## 2019-10-23 ENCOUNTER — Encounter: Payer: Self-pay | Admitting: Physical Therapy

## 2019-10-23 ENCOUNTER — Other Ambulatory Visit: Payer: Self-pay

## 2019-10-23 DIAGNOSIS — M6281 Muscle weakness (generalized): Secondary | ICD-10-CM | POA: Diagnosis not present

## 2019-10-23 DIAGNOSIS — G8929 Other chronic pain: Secondary | ICD-10-CM | POA: Diagnosis not present

## 2019-10-23 DIAGNOSIS — R600 Localized edema: Secondary | ICD-10-CM | POA: Diagnosis not present

## 2019-10-23 DIAGNOSIS — M25661 Stiffness of right knee, not elsewhere classified: Secondary | ICD-10-CM | POA: Diagnosis not present

## 2019-10-23 DIAGNOSIS — M25561 Pain in right knee: Secondary | ICD-10-CM | POA: Diagnosis not present

## 2019-10-23 NOTE — Therapy (Signed)
[facility] [place], [place], [address] Phone: [phone]   Fax:  [phone]  Physical Therapy Treatment  Patient Details  Name: Julie Day MRN: [id number] Date of Birth: Jan 17, 1946 Referring Provider (PT): Melrose Nakayama MD   Encounter Date: 10/23/2019  PT End of Session - 10/23/19 1151    Visit Number  6    Number of Visits  12    Date for PT Re-Evaluation  01/10/20    Authorization Type  FOTO.    PT Start Time  1115    PT Stop Time  1205    PT Time Calculation (min)  50 min    Activity Tolerance  Patient tolerated treatment well    Behavior During Therapy  WFL for tasks assessed/performed       Past Medical History:  Diagnosis Date  . Allergic rhinitis   . Anginal pain ([place])    Dx as GERD  . Atypical mole 09/23/2017   Left Inner Thigh-Mild  . Atypical mole 09/23/2017   Right Axilla-Mild  . Atypical mole 09/05/2015   Center Abdomen-Severe (clear) (Dr. Nevada Crane)  . Atypical mole 04/14/2019   Right Thigh-Solar Lentigo  . Chronic cystitis   . Degenerative disc disease   . GERD (gastroesophageal reflux disease)   . Hemangioma of liver   . History of hiatal hernia 2017  . Hypertension   . SCC (squamous cell carcinoma) 09/05/2015   Left Dorsal Hand-Well Diff Keratoacanthoma (Dr. Nevada Crane) (free)  . SCC (squamous cell carcinoma) 04/14/2019   Left Outer Shin-Keratoacanthoma (treatment after biopsy)  . SCC (squamous cell carcinoma) 07/21/2019   Right Thigh-Keratoacanthoma (treatment Fluorouracil Cream)  . Squamous cell carcinoma of skin 09/23/2017   Right Upper Shin-Well Diff (Cx3,5FU)    Past Surgical History:  Procedure Laterality Date  . ABDOMINAL HYSTERECTOMY    . CHOLECYSTECTOMY    . COLON RESECTION N/A 05/31/2015   Procedure: LAPAROSCOPIC REPAIR OF HIATAL HERNIA  AND LAPAROSCOPIC NISSEN FUNDOPLICATION;  Surgeon: Fanny Skates, MD;  Location: [place];  Service: General;  Laterality: N/A;  . ESOPHAGEAL  MANOMETRY N/A 02/27/2015   Procedure: ESOPHAGEAL MANOMETRY (EM);  Surgeon: Garlan Fair, MD;  [facility];  Service: Endoscopy;  Laterality: N/A;  . ESOPHAGOGASTRODUODENOSCOPY (EGD) WITH PROPOFOL N/A 02/26/2015   Procedure: ESOPHAGOGASTRODUODENOSCOPY (EGD) WITH PROPOFOL;  Surgeon: Garlan Fair, MD;  [facility];  Service: Endoscopy;  Laterality: N/A;  . HIATAL HERNIA REPAIR  05/31/2015  . TOTAL KNEE ARTHROPLASTY Right 10/03/2019   Procedure: RIGHT TOTAL KNEE ARTHROPLASTY;  Surgeon: Melrose Nakayama, MD;  [facility];  Service: Orthopedics;  Laterality: Right;    There were no vitals filed for this visit.  Subjective Assessment - 10/23/19 1119    Subjective  COVID-19 screen performed prior to patient entering clinic. Patient arrived with a lot of pain, did ok last treatment.    Pertinent History  H/o LBP, HTN, DDD.    How long can you walk comfortably?  Around house with walker.    Patient Stated Goals  Get back to normal.    Currently in Pain?  Yes    Pain Score  8     Pain Location  Knee    Pain Orientation  Right    Pain Descriptors / Indicators  Aching;Discomfort    Pain Type  Surgical pain    Pain Onset  1 to 4 weeks ago    Pain Frequency  Constant    Aggravating Factors   bending knee  Pain Relieving Factors  at rest         [facility] PT Assessment - 10/23/19 0001      ROM / Strength   AROM / PROM / Strength  AROM;PROM      AROM   AROM Assessment Site  Knee    Right/Left Knee  Right    Right Knee Extension  -2    Right Knee Flexion  95      PROM   PROM Assessment Site  Knee    Right/Left Knee  Right    Right Knee Extension  0    Right Knee Flexion  112                   [facility] Adult PT Treatment/Exercise - 10/23/19 0001      Knee/Hip Exercises: Aerobic   Recumbent Bike  33min seat 3    Nustep  61min adjusted for ROM      Electrical Stimulation   Electrical Stimulation Location  Right quad    Electrical Stimulation  Action  VMS 10/10 x35min with quad sets for activation    Electrical Stimulation Goals  Neuromuscular facilitation      Vasopneumatic   Number Minutes Vasopneumatic   10 minutes    Vasopnuematic Location   Knee    Vasopneumatic Pressure  Low    Vasopneumatic Temperature   34      Manual Therapy   Manual Therapy  Passive ROM;Soft tissue mobilization    Manual therapy comments  manual PROM for right knee flexion/ext with low load holds to improve mobility.    Soft tissue mobilization  manual STW to right posterior knee and IT band to reduce pain and tone               PT Short Term Goals - 10/12/19 1211      PT SHORT TERM GOAL #1   Title  STG's=LTG's.        PT Long Term Goals - 10/23/19 1201      PT LONG TERM GOAL #1   Title  Patient will be independent with HEP    Time  6    Period  Weeks    Status  On-going      PT LONG TERM GOAL #2   Title  Active right knee flexion to 115 degrees+ so the patient can perform functional tasks and do so with pain not > 2-3/10.    Time  6    Period  Weeks    Status  On-going   AROM 95 degrees 10/23/19     PT LONG TERM GOAL #3   Title  Increase right knee strength to a solid 4+/5 to provide good stability for accomplishment of functional activities.    Time  6    Period  Weeks    Status  On-going      PT LONG TERM GOAL #4   Title  Full active knee extension in order to normalize gait.    Time  6    Period  Weeks    Status  On-going   AROM -2 degrees 10/23/19     PT LONG TERM GOAL #5   Title  Perform a reciprocating stair gait with one railing with pain not > 2-3/10.    Time  6    Period  Weeks    Status  On-going            Plan - 10/23/19 1156    Clinical  Impression Statement  Patient tolerated treatment fair due to increased pain in right knee today. Patient has improved with ROM in right knee today. Patient reported doing self stretches 3 times daily, some ongoing pain esp at night. educated patient on ice  before bed to help alieve pain. Patient progressing toward goals. Normal response upon removal of modalities.    Personal Factors and Comorbidities  Comorbidity 1;Comorbidity 2    Comorbidities  H/o LBP, HTN, DDD.    Examination-Activity Limitations  Locomotion Level;Stairs;Other    Examination-Participation Restrictions  Other    Stability/Clinical Decision Making  Stable/Uncomplicated    Rehab Potential  Excellent    PT Frequency  3x / week    PT Duration  4 weeks    PT Next Visit Plan  Continue with quad activation training and ROM.    Consulted and Agree with Plan of Care  Patient       Patient will benefit from skilled therapeutic intervention in order to improve the following deficits and impairments:  Pain, Abnormal gait, Decreased activity tolerance, Decreased range of motion, Decreased mobility, Decreased strength, Increased edema  Visit Diagnosis: Chronic pain of right knee  Stiffness of right knee, not elsewhere classified  Localized edema  Muscle weakness (generalized)     Problem List Patient Active Problem List   Diagnosis Date Noted  . Primary localized osteoarthritis of right knee 10/03/2019  . Paraesophageal hernia with obstruction but no gangrene 05/31/2015    Draylon Mercadel P, PTA 10/23/2019, 12:09 PM  [facility] [place], [place], [address] Phone: [phone]   Fax:  [phone]  Name: Achol Wilcutt MRN: [id number] Date of Birth: 1945/11/02

## 2019-10-25 ENCOUNTER — Other Ambulatory Visit: Payer: Self-pay

## 2019-10-25 ENCOUNTER — Ambulatory Visit: Payer: Medicare HMO | Admitting: Physical Therapy

## 2019-10-25 DIAGNOSIS — M25661 Stiffness of right knee, not elsewhere classified: Secondary | ICD-10-CM

## 2019-10-25 DIAGNOSIS — M25561 Pain in right knee: Secondary | ICD-10-CM | POA: Diagnosis not present

## 2019-10-25 DIAGNOSIS — G8929 Other chronic pain: Secondary | ICD-10-CM

## 2019-10-25 DIAGNOSIS — R600 Localized edema: Secondary | ICD-10-CM | POA: Diagnosis not present

## 2019-10-25 DIAGNOSIS — M6281 Muscle weakness (generalized): Secondary | ICD-10-CM | POA: Diagnosis not present

## 2019-10-25 NOTE — Therapy (Signed)
[facility] [place], [place], [address] Phone: [phone]   Fax:  [phone]  Physical Therapy Treatment  Patient Details  Name: Julie Day MRN: [id number] Date of Birth: 1945/12/04 Referring Provider (PT): Melrose Nakayama MD   Encounter Date: 10/25/2019  PT End of Session - 10/25/19 1212    Visit Number  7    Number of Visits  12    Date for PT Re-Evaluation  01/10/20    Authorization Type  FOTO.    PT Start Time  1115    PT Stop Time  1204    PT Time Calculation (min)  49 min    Activity Tolerance  Patient tolerated treatment well    Behavior During Therapy  WFL for tasks assessed/performed       Past Medical History:  Diagnosis Date  . Allergic rhinitis   . Anginal pain ([place])    Dx as GERD  . Atypical mole 09/23/2017   Left Inner Thigh-Mild  . Atypical mole 09/23/2017   Right Axilla-Mild  . Atypical mole 09/05/2015   Center Abdomen-Severe (clear) (Dr. Nevada Crane)  . Atypical mole 04/14/2019   Right Thigh-Solar Lentigo  . Chronic cystitis   . Degenerative disc disease   . GERD (gastroesophageal reflux disease)   . Hemangioma of liver   . History of hiatal hernia 2017  . Hypertension   . SCC (squamous cell carcinoma) 09/05/2015   Left Dorsal Hand-Well Diff Keratoacanthoma (Dr. Nevada Crane) (free)  . SCC (squamous cell carcinoma) 04/14/2019   Left Outer Shin-Keratoacanthoma (treatment after biopsy)  . SCC (squamous cell carcinoma) 07/21/2019   Right Thigh-Keratoacanthoma (treatment Fluorouracil Cream)  . Squamous cell carcinoma of skin 09/23/2017   Right Upper Shin-Well Diff (Cx3,5FU)    Past Surgical History:  Procedure Laterality Date  . ABDOMINAL HYSTERECTOMY    . CHOLECYSTECTOMY    . COLON RESECTION N/A 05/31/2015   Procedure: LAPAROSCOPIC REPAIR OF HIATAL HERNIA  AND LAPAROSCOPIC NISSEN FUNDOPLICATION;  Surgeon: Fanny Skates, MD;  Location: [facility];  Service: General;  Laterality: N/A;  . ESOPHAGEAL  MANOMETRY N/A 02/27/2015   Procedure: ESOPHAGEAL MANOMETRY (EM);  Surgeon: Garlan Fair, MD;  [facility];  Service: Endoscopy;  Laterality: N/A;  . ESOPHAGOGASTRODUODENOSCOPY (EGD) WITH PROPOFOL N/A 02/26/2015   Procedure: ESOPHAGOGASTRODUODENOSCOPY (EGD) WITH PROPOFOL;  Surgeon: Garlan Fair, MD;  [facility];  Service: Endoscopy;  Laterality: N/A;  . HIATAL HERNIA REPAIR  05/31/2015  . TOTAL KNEE ARTHROPLASTY Right 10/03/2019   Procedure: RIGHT TOTAL KNEE ARTHROPLASTY;  Surgeon: Melrose Nakayama, MD;  [facility];  Service: Orthopedics;  Laterality: Right;    There were no vitals filed for this visit.  Subjective Assessment - 10/25/19 1157    Subjective  COVID-19 screen performed prior to patient entering clinic.  I hurt a lot after last treatment but doing better now.    Pertinent History  H/o LBP, HTN, DDD.    How long can you walk comfortably?  Around house with walker.    Patient Stated Goals  Get back to normal.    Currently in Pain?  Yes    Pain Score  5     Pain Location  Head    Pain Orientation  Right    Pain Descriptors / Indicators  Aching;Discomfort    Pain Type  Surgical pain    Pain Onset  1 to 4 weeks ago    Pain Frequency  Constant  [facility] Adult PT Treatment/Exercise - 10/25/19 0001      Exercises   Exercises  Knee/Hip      Knee/Hip Exercises: Aerobic   Recumbent Bike  15 minutes moving seat forward to seat 2.      Knee/Hip Exercises: Supine   Quad Sets Limitations  Quad sets x 14 minutes facilitated with VMS with 10 sec right quad contractons f/b 10 sec rest.      Vasopneumatic   Number Minutes Vasopneumatic   10 minutes    Vasopnuematic Location   --   Right knee.   Vasopneumatic Pressure  Low               PT Short Term Goals - 10/12/19 1211      PT SHORT TERM GOAL #1   Title  STG's=LTG's.        PT Long Term Goals - 10/23/19 1201      PT LONG TERM GOAL #1   Title   Patient will be independent with HEP    Time  6    Period  Weeks    Status  On-going      PT LONG TERM GOAL #2   Title  Active right knee flexion to 115 degrees+ so the patient can perform functional tasks and do so with pain not > 2-3/10.    Time  6    Period  Weeks    Status  On-going   AROM 95 degrees 10/23/19     PT LONG TERM GOAL #3   Title  Increase right knee strength to a solid 4+/5 to provide good stability for accomplishment of functional activities.    Time  6    Period  Weeks    Status  On-going      PT LONG TERM GOAL #4   Title  Full active knee extension in order to normalize gait.    Time  6    Period  Weeks    Status  On-going   AROM -2 degrees 10/23/19     PT LONG TERM GOAL #5   Title  Perform a reciprocating stair gait with one railing with pain not > 2-3/10.    Time  6    Period  Weeks    Status  On-going            Plan - 10/25/19 1209    Clinical Impression Statement  Patient doing great.  Seat 2 on bike today and full right knee extension in supine.    Personal Factors and Comorbidities  Comorbidity 1;Comorbidity 2    Comorbidities  H/o LBP, HTN, DDD.    Examination-Activity Limitations  Locomotion Level;Stairs;Other    Examination-Participation Restrictions  Other    Stability/Clinical Decision Making  Stable/Uncomplicated    Rehab Potential  Excellent    PT Frequency  3x / week    PT Duration  4 weeks    PT Next Visit Plan  Continue with quad activation training and ROM.    Consulted and Agree with Plan of Care  Patient       Patient will benefit from skilled therapeutic intervention in order to improve the following deficits and impairments:  Pain, Abnormal gait, Decreased activity tolerance, Decreased range of motion, Decreased mobility, Decreased strength, Increased edema  Visit Diagnosis: Chronic pain of right knee  Stiffness of right knee, not elsewhere classified  Localized edema     Problem List Patient Active Problem List    Diagnosis Date Noted  .  Primary localized osteoarthritis of right knee 10/03/2019  . Paraesophageal hernia with obstruction but no gangrene 05/31/2015    Kyilee Gregg, [place] MPT 10/25/2019, 12:15 PM  [facility] [address] [place], [place], [address] Phone: [phone]   Fax:  [phone]  Name: Julie Day MRN: [id number] Date of Birth: 07-16-1946

## 2019-10-26 ENCOUNTER — Ambulatory Visit: Payer: Medicare HMO | Admitting: Physician Assistant

## 2019-10-27 ENCOUNTER — Other Ambulatory Visit: Payer: Self-pay

## 2019-10-27 ENCOUNTER — Ambulatory Visit: Payer: Medicare HMO | Admitting: Physical Therapy

## 2019-10-27 DIAGNOSIS — M25561 Pain in right knee: Secondary | ICD-10-CM | POA: Diagnosis not present

## 2019-10-27 DIAGNOSIS — G8929 Other chronic pain: Secondary | ICD-10-CM

## 2019-10-27 DIAGNOSIS — R600 Localized edema: Secondary | ICD-10-CM

## 2019-10-27 DIAGNOSIS — M6281 Muscle weakness (generalized): Secondary | ICD-10-CM | POA: Diagnosis not present

## 2019-10-27 DIAGNOSIS — M25661 Stiffness of right knee, not elsewhere classified: Secondary | ICD-10-CM | POA: Diagnosis not present

## 2019-10-27 NOTE — Therapy (Signed)
[facility] [place], [place], [address] Phone: [phone]   Fax:  [phone]  Physical Therapy Treatment  Patient Details  Name: Julie Day MRN: [id number] Date of Birth: [birth year] Referring Provider (PT): Melrose Nakayama MD   Encounter Date: 10/27/2019  PT End of Session - 10/27/19 1251    Visit Number  8    Number of Visits  12    Date for PT Re-Evaluation  01/10/20    Authorization Type  FOTO.    PT Start Time  1116    PT Stop Time  1206    PT Time Calculation (min)  50 min    Activity Tolerance  Patient tolerated treatment well    Behavior During Therapy  WFL for tasks assessed/performed       Past Medical History:  Diagnosis Date  . Allergic rhinitis   . Anginal pain ([place])    Dx as GERD  . Atypical mole 09/23/2017   Left Inner Thigh-Mild  . Atypical mole 09/23/2017   Right Axilla-Mild  . Atypical mole 09/05/2015   Center Abdomen-Severe (clear) (Dr. Nevada Crane)  . Atypical mole 04/14/2019   Right Thigh-Solar Lentigo  . Chronic cystitis   . Degenerative disc disease   . GERD (gastroesophageal reflux disease)   . Hemangioma of liver   . History of hiatal hernia 2017  . Hypertension   . SCC (squamous cell carcinoma) 09/05/2015   Left Dorsal Hand-Well Diff Keratoacanthoma (Dr. Nevada Crane) (free)  . SCC (squamous cell carcinoma) 04/14/2019   Left Outer Shin-Keratoacanthoma (treatment after biopsy)  . SCC (squamous cell carcinoma) 07/21/2019   Right Thigh-Keratoacanthoma (treatment Fluorouracil Cream)  . Squamous cell carcinoma of skin 09/23/2017   Right Upper Shin-Well Diff (Cx3,5FU)    Past Surgical History:  Procedure Laterality Date  . ABDOMINAL HYSTERECTOMY    . CHOLECYSTECTOMY    . COLON RESECTION N/A 05/31/2015   Procedure: LAPAROSCOPIC REPAIR OF HIATAL HERNIA  AND LAPAROSCOPIC NISSEN FUNDOPLICATION;  Surgeon: Fanny Skates, MD;  Location: [facility];  Service: General;  Laterality: N/A;  . ESOPHAGEAL  MANOMETRY N/A 02/27/2015   Procedure: ESOPHAGEAL MANOMETRY (EM);  Surgeon: Garlan Fair, MD;  [facility];  Service: Endoscopy;  Laterality: N/A;  . ESOPHAGOGASTRODUODENOSCOPY (EGD) WITH PROPOFOL N/A 02/26/2015   Procedure: ESOPHAGOGASTRODUODENOSCOPY (EGD) WITH PROPOFOL;  Surgeon: Garlan Fair, MD;  [facility];  Service: Endoscopy;  Laterality: N/A;  . HIATAL HERNIA REPAIR  05/31/2015  . TOTAL KNEE ARTHROPLASTY Right 10/03/2019   Procedure: RIGHT TOTAL KNEE ARTHROPLASTY;  Surgeon: Melrose Nakayama, MD;  Location: [facility];  Service: Orthopedics;  Laterality: Right;    There were no vitals filed for this visit.  Subjective Assessment - 10/27/19 1250    Subjective  COVID-19 screen performed prior to patient entering clinic. Doing well.    Pertinent History  H/o LBP, HTN, DDD.    How long can you walk comfortably?  Around house with walker.    Patient Stated Goals  Get back to normal.    Currently in Pain?  Yes    Pain Score  5     Pain Location  Head    Pain Orientation  Right    Pain Descriptors / Indicators  Aching;Discomfort    Pain Type  Surgical pain    Pain Onset  1 to 4 weeks ago                       [facility] Adult PT Treatment/Exercise -  10/27/19 0001      Exercises   Exercises  Knee/Hip      Knee/Hip Exercises: Aerobic   Recumbent Bike  15 minutes making it to seat position 1 today.      Knee/Hip Exercises: Supine   Short Arc Quad Sets Limitations  SAQ's assisted into full right knee extension x 10 minutes facilitated with VMS to quads with 10 sec extension holds f/b a 10 sec rest.      Modalities   Modalities  Vasopneumatic      Vasopneumatic   Number Minutes Vasopneumatic   15 minutes    Vasopnuematic Location   --   RT knee.   Vasopneumatic Pressure  Medium               PT Short Term Goals - 10/12/19 1211      PT SHORT TERM GOAL #1   Title  STG's=LTG's.        PT Long Term Goals - 10/23/19 1201      PT  LONG TERM GOAL #1   Title  Patient will be independent with HEP    Time  6    Period  Weeks    Status  On-going      PT LONG TERM GOAL #2   Title  Active right knee flexion to 115 degrees+ so the patient can perform functional tasks and do so with pain not > 2-3/10.    Time  6    Period  Weeks    Status  On-going   AROM 95 degrees 10/23/19     PT LONG TERM GOAL #3   Title  Increase right knee strength to a solid 4+/5 to provide good stability for accomplishment of functional activities.    Time  6    Period  Weeks    Status  On-going      PT LONG TERM GOAL #4   Title  Full active knee extension in order to normalize gait.    Time  6    Period  Weeks    Status  On-going   AROM -2 degrees 10/23/19     PT LONG TERM GOAL #5   Title  Perform a reciprocating stair gait with one railing with pain not > 2-3/10.    Time  6    Period  Weeks    Status  On-going            Plan - 10/27/19 1255    Clinical Impression Statement  Excellent progress.  Patient progressed to seat position 1 today.  Improved right quad activation but continues to have an extensor lag.  Full passive right knee extension.    Personal Factors and Comorbidities  Comorbidity 1;Comorbidity 2    Comorbidities  H/o LBP, HTN, DDD.    Examination-Activity Limitations  Locomotion Level;Stairs;Other    Examination-Participation Restrictions  Other    Stability/Clinical Decision Making  Stable/Uncomplicated    Rehab Potential  Excellent    PT Frequency  3x / week    PT Duration  4 weeks    PT Next Visit Plan  Continue with quad activation training and ROM.    Consulted and Agree with Plan of Care  Patient       Patient will benefit from skilled therapeutic intervention in order to improve the following deficits and impairments:  Pain, Abnormal gait, Decreased activity tolerance, Decreased range of motion, Decreased mobility, Decreased strength, Increased edema  Visit Diagnosis: Chronic pain of right  knee  Stiffness of right knee, not elsewhere classified  Localized edema     Problem List Patient Active Problem List   Diagnosis Date Noted  . Primary localized osteoarthritis of right knee 10/03/2019  . 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 [place], [place], [address] Phone: [phone]   Fax:  [phone]  Name: Julie Day MRN: [id number] Date of Birth: 1945-10-05

## 2019-10-30 ENCOUNTER — Encounter: Payer: Self-pay | Admitting: Physical Therapy

## 2019-10-30 ENCOUNTER — Other Ambulatory Visit: Payer: Self-pay

## 2019-10-30 ENCOUNTER — Ambulatory Visit: Payer: Medicare HMO | Admitting: Physical Therapy

## 2019-10-30 DIAGNOSIS — M25661 Stiffness of right knee, not elsewhere classified: Secondary | ICD-10-CM

## 2019-10-30 DIAGNOSIS — G8929 Other chronic pain: Secondary | ICD-10-CM

## 2019-10-30 DIAGNOSIS — R600 Localized edema: Secondary | ICD-10-CM

## 2019-10-30 DIAGNOSIS — M6281 Muscle weakness (generalized): Secondary | ICD-10-CM

## 2019-10-30 DIAGNOSIS — M25561 Pain in right knee: Secondary | ICD-10-CM | POA: Diagnosis not present

## 2019-10-30 NOTE — Addendum Note (Signed)
Addended by: Gabriela Eves on: 10/30/2019 05:53 PM   Modules accepted: Orders

## 2019-10-30 NOTE — Therapy (Addendum)
[facility] [place], [place], [address] Phone: [phone]   Fax:  [phone]  Physical Therapy Treatment  Patient Details  Name: Julie Day MRN: [id number] Date of Birth: Dec 15, 1945 Referring Provider (PT): Melrose Nakayama MD   Encounter Date: 10/30/2019  PT End of Session - 10/30/19 1123    Visit Number  9    Number of Visits  12    Date for PT Re-Evaluation  01/10/20    Authorization Type  FOTO.    PT Start Time  1120    PT Stop Time  1201    PT Time Calculation (min)  41 min    Activity Tolerance  Patient tolerated treatment well    Behavior During Therapy  WFL for tasks assessed/performed       Past Medical History:  Diagnosis Date  . Allergic rhinitis   . Anginal pain ([place])    Dx as GERD  . Atypical mole 09/23/2017   Left Inner Thigh-Mild  . Atypical mole 09/23/2017   Right Axilla-Mild  . Atypical mole 09/05/2015   Center Abdomen-Severe (clear) (Dr. Nevada Crane)  . Atypical mole 04/14/2019   Right Thigh-Solar Lentigo  . Chronic cystitis   . Degenerative disc disease   . GERD (gastroesophageal reflux disease)   . Hemangioma of liver   . History of hiatal hernia 2017  . Hypertension   . SCC (squamous cell carcinoma) 09/05/2015   Left Dorsal Hand-Well Diff Keratoacanthoma (Dr. Nevada Crane) (free)  . SCC (squamous cell carcinoma) 04/14/2019   Left Outer Shin-Keratoacanthoma (treatment after biopsy)  . SCC (squamous cell carcinoma) 07/21/2019   Right Thigh-Keratoacanthoma (treatment Fluorouracil Cream)  . Squamous cell carcinoma of skin 09/23/2017   Right Upper Shin-Well Diff (Cx3,5FU)    Past Surgical History:  Procedure Laterality Date  . ABDOMINAL HYSTERECTOMY    . CHOLECYSTECTOMY    . COLON RESECTION N/A 05/31/2015   Procedure: LAPAROSCOPIC REPAIR OF HIATAL HERNIA  AND LAPAROSCOPIC NISSEN FUNDOPLICATION;  Surgeon: Fanny Skates, MD;  Location: [facility];  Service: General;  Laterality: N/A;  . ESOPHAGEAL  MANOMETRY N/A 02/27/2015   Procedure: ESOPHAGEAL MANOMETRY (EM);  Surgeon: Garlan Fair, MD;  [facility];  Service: Endoscopy;  Laterality: N/A;  . ESOPHAGOGASTRODUODENOSCOPY (EGD) WITH PROPOFOL N/A 02/26/2015   Procedure: ESOPHAGOGASTRODUODENOSCOPY (EGD) WITH PROPOFOL;  Surgeon: Garlan Fair, MD;  [facility];  Service: Endoscopy;  Laterality: N/A;  . HIATAL HERNIA REPAIR  05/31/2015  . TOTAL KNEE ARTHROPLASTY Right 10/03/2019   Procedure: RIGHT TOTAL KNEE ARTHROPLASTY;  Surgeon: Melrose Nakayama, MD;  [facility];  Service: Orthopedics;  Laterality: Right;    There were no vitals filed for this visit.  Subjective Assessment - 10/30/19 1121    Subjective  COVID-19 screen performed prior to patient entering clinic. Reports she is interested in learning to walk with a cane. Reports that she is having more pain today.    Pertinent History  H/o LBP, HTN, DDD.    How long can you walk comfortably?  Around house with walker.    Patient Stated Goals  Get back to normal.    Currently in Pain?  Yes    Pain Score  7     Pain Location  Knee    Pain Orientation  Right    Pain Descriptors / Indicators  Discomfort    Pain Type  Surgical pain    Pain Onset  1 to 4 weeks ago    Pain Frequency  Constant  [facility] PT Assessment - 10/30/19 0001      Assessment   Medical Diagnosis  S/p right total knee replacement.    Referring Provider (PT)  Melrose Nakayama MD    Onset Date/Surgical Date  10/03/19    Next MD Visit  11/06/2019      Restrictions   Weight Bearing Restrictions  No      ROM / Strength   AROM / PROM / Strength  AROM      AROM   Overall AROM   Within functional limits for tasks performed    AROM Assessment Site  Knee    Right/Left Knee  Right    Right Knee Extension  3    Right Knee Flexion  115                   [facility] Adult PT Treatment/Exercise - 10/30/19 0001      Ambulation/Gait   Ambulation/Gait  Yes    Ambulation/Gait  Assistance  6: Modified independent (Device/Increase time)    Ambulation Distance (Feet)  140 Feet    Assistive device  Small based quad cane    Gait Pattern  Step-through pattern;Decreased arm swing - right;Decreased weight shift to right;Antalgic    Ambulation Surface  Level;Indoor      Knee/Hip Exercises: Aerobic   Recumbent Bike  L3, seat 5 x10 min      Knee/Hip Exercises: Seated   Long Arc Quad  AROM;Right;20 reps      Knee/Hip Exercises: Supine   Short Arc Quad Sets  AROM;Right;20 reps    Terminal Knee Extension  Strengthening;Right;20 reps;Theraband    Theraband Level (Terminal Knee Extension)  Level 1 (Yellow)    Straight Leg Raises  AROM;Right;20 reps      Modalities   Modalities  Theme park manager Action  Pre-Mod    Electrical Stimulation Parameters  80-150 hz x15 min    Electrical Stimulation Goals  Pain;Edema      Vasopneumatic   Number Minutes Vasopneumatic   15 minutes    Vasopnuematic Location   Knee    Vasopneumatic Pressure  Medium    Vasopneumatic Temperature   34               PT Short Term Goals - 10/12/19 1211      PT SHORT TERM GOAL #1   Title  STG's=LTG's.        PT Long Term Goals - 10/30/19 1156      PT LONG TERM GOAL #1   Title  Patient will be independent with HEP    Time  6    Period  Weeks    Status  On-going      PT LONG TERM GOAL #2   Title  Active right knee flexion to 115 degrees+ so the patient can perform functional tasks and do so with pain not > 2-3/10.    Time  6    Period  Weeks    Status  Achieved   AROM 95 degrees 10/23/19     PT LONG TERM GOAL #3   Title  Increase right knee strength to a solid 4+/5 to provide good stability for accomplishment of functional activities.    Time  6    Period  Weeks    Status  On-going      PT LONG TERM GOAL #4   Title  Full active knee extension in order to  normalize gait.    Time  6    Period  Weeks    Status  On-going   AROM -2 degrees 10/23/19     PT LONG TERM GOAL #5   Title  Perform a reciprocating stair gait with one railing with pain not > 2-3/10.    Time  6    Period  Weeks    Status  On-going            Plan - 10/30/19 1156    Clinical Impression Statement  Patient presented in clinic with reports of more pain in R knee today but willing to learn how to ambulate with [facility]. Patient instructed in mod 3 pt gait pattern with [facility] to which she learned quickly and required minimal VCs for corrections. Minimal active R quad contraction noted during therex session but no complaints of pain. AROM of R knee measured as 3-115 deg. Normal modalties response noted following removal of the modaliteis.    Personal Factors and Comorbidities  Comorbidity 1;Comorbidity 2    Comorbidities  H/o LBP, HTN, DDD.    Examination-Activity Limitations  Locomotion Level;Stairs;Other    Examination-Participation Restrictions  Other    Stability/Clinical Decision Making  Stable/Uncomplicated    Rehab Potential  Excellent    PT Frequency  3x / week    PT Duration  4 weeks    PT Treatment/Interventions  ADLs/Self Care Home Management;Cryotherapy;Electrical Stimulation;Iontophoresis 4mg /ml Dexamethasone;Moist Heat;Neuromuscular re-education;Manual techniques;Passive range of motion;Therapeutic activities;Therapeutic exercise;Functional mobility training;Stair training;Gait training;Vasopneumatic Device;Taping;Balance training    PT Next Visit Plan  Continue with quad activation training and ROM.    Consulted and Agree with Plan of Care  Patient       Patient will benefit from skilled therapeutic intervention in order to improve the following deficits and impairments:  Pain, Abnormal gait, Decreased activity tolerance, Decreased range of motion, Decreased mobility, Decreased strength, Increased edema  Visit Diagnosis: Chronic pain of right knee - Plan: PT  plan of care cert/re-cert  Stiffness of right knee, not elsewhere classified - Plan: PT plan of care cert/re-cert  Localized edema - Plan: PT plan of care cert/re-cert  Muscle weakness (generalized) - Plan: PT plan of care cert/re-cert     Problem List Patient Active Problem List   Diagnosis Date Noted  . Primary localized osteoarthritis of right knee 10/03/2019  . 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 [address] [place], [place], [address] Phone: [phone]   Fax:  [phone]  Name: Julie Day MRN: [id number] Date of Birth: [birth year]

## 2019-11-01 ENCOUNTER — Other Ambulatory Visit: Payer: Self-pay

## 2019-11-01 ENCOUNTER — Encounter: Payer: Self-pay | Admitting: Physical Therapy

## 2019-11-01 ENCOUNTER — Ambulatory Visit: Payer: Medicare HMO | Admitting: Physical Therapy

## 2019-11-01 DIAGNOSIS — M25561 Pain in right knee: Secondary | ICD-10-CM | POA: Diagnosis not present

## 2019-11-01 DIAGNOSIS — M25661 Stiffness of right knee, not elsewhere classified: Secondary | ICD-10-CM | POA: Diagnosis not present

## 2019-11-01 DIAGNOSIS — G8929 Other chronic pain: Secondary | ICD-10-CM | POA: Diagnosis not present

## 2019-11-01 DIAGNOSIS — R600 Localized edema: Secondary | ICD-10-CM | POA: Diagnosis not present

## 2019-11-01 DIAGNOSIS — M6281 Muscle weakness (generalized): Secondary | ICD-10-CM

## 2019-11-01 NOTE — Therapy (Signed)
[name], [place], [address] Phone: [phone]   Fax:  [phone]  Physical Therapy Treatment  Progress Note Reporting Period 10/12/2019 to 11/01/2019  See note below for Objective Data and Assessment of Progress/Goals. Patient is progressing well with ROM and with transitions from rolling walker to [facility]. Gabriela Eves, PT, DPT     Patient Details  Name: Julie Day MRN: [id number] Date of Birth: [birth year] Referring Provider (PT): Melrose Nakayama MD   Encounter Date: 11/01/2019  PT End of Session - 11/01/19 1152    Visit Number  10    Number of Visits  12    Date for PT Re-Evaluation  01/10/20    Authorization Type  FOTO.    PT Start Time  1116    PT Stop Time  1200    PT Time Calculation (min)  44 min    Activity Tolerance  Patient tolerated treatment well    Behavior During Therapy  WFL for tasks assessed/performed       Past Medical History:  Diagnosis Date  . Allergic rhinitis   . Anginal pain ([place])    Dx as GERD  . Atypical mole 09/23/2017   Left Inner Thigh-Mild  . Atypical mole 09/23/2017   Right Axilla-Mild  . Atypical mole 09/05/2015   Center Abdomen-Severe (clear) (Dr. Nevada Crane)  . Atypical mole 04/14/2019   Right Thigh-Solar Lentigo  . Chronic cystitis   . Degenerative disc disease   . GERD (gastroesophageal reflux disease)   . Hemangioma of liver   . History of hiatal hernia 2017  . Hypertension   . SCC (squamous cell carcinoma) 09/05/2015   Left Dorsal Hand-Well Diff Keratoacanthoma (Dr. Nevada Crane) (free)  . SCC (squamous cell carcinoma) 04/14/2019   Left Outer Shin-Keratoacanthoma (treatment after biopsy)  . SCC (squamous cell carcinoma) 07/21/2019   Right Thigh-Keratoacanthoma (treatment Fluorouracil Cream)  . Squamous cell carcinoma of skin 09/23/2017   Right Upper Shin-Well Diff (Cx3,5FU)    Past Surgical History:  Procedure Laterality Date  . ABDOMINAL HYSTERECTOMY     . CHOLECYSTECTOMY    . COLON RESECTION N/A 05/31/2015   Procedure: LAPAROSCOPIC REPAIR OF HIATAL HERNIA  AND LAPAROSCOPIC NISSEN FUNDOPLICATION;  Surgeon: Fanny Skates, MD;  Location: [facility];  Service: General;  Laterality: N/A;  . ESOPHAGEAL MANOMETRY N/A 02/27/2015   Procedure: ESOPHAGEAL MANOMETRY (EM);  Surgeon: Garlan Fair, MD;  [facility];  Service: Endoscopy;  Laterality: N/A;  . ESOPHAGOGASTRODUODENOSCOPY (EGD) WITH PROPOFOL N/A 02/26/2015   Procedure: ESOPHAGOGASTRODUODENOSCOPY (EGD) WITH PROPOFOL;  Surgeon: Garlan Fair, MD;  [facility];  Service: Endoscopy;  Laterality: N/A;  . HIATAL HERNIA REPAIR  05/31/2015  . TOTAL KNEE ARTHROPLASTY Right 10/03/2019   Procedure: RIGHT TOTAL KNEE ARTHROPLASTY;  Surgeon: Melrose Nakayama, MD;  [facility];  Service: Orthopedics;  Laterality: Right;    There were no vitals filed for this visit.  Subjective Assessment - 11/01/19 1116    Subjective  COVID-19 screen performed prior to patient entering clinic. Reports she feels much less restricted with use of SBQC.    Pertinent History  H/o LBP, HTN, DDD.    How long can you walk comfortably?  Around house with walker.    Patient Stated Goals  Get back to normal.    Currently in Pain?  Yes    Pain Score  4     Pain Location  Knee    Pain Orientation  Right    Pain Descriptors /  Indicators  Discomfort    Pain Type  Surgical pain    Pain Onset  1 to 4 weeks ago    Pain Frequency  Constant         [facility] PT Assessment - 11/01/19 0001      Assessment   Medical Diagnosis  S/p right total knee replacement.    Referring Provider (PT)  Melrose Nakayama MD    Onset Date/Surgical Date  10/03/19    Next MD Visit  11/06/2019      Restrictions   Weight Bearing Restrictions  No      Observation/Other Assessments   Focus on Therapeutic Outcomes (FOTO)   44%, CK      Observation/Other Assessments-Edema    Edema  Circumferential      Circumferential Edema    Circumferential - Right  42.5 cm    Circumferential - Left   40 cm      ROM / Strength   AROM / PROM / Strength  AROM      AROM   Overall AROM   Within functional limits for tasks performed    AROM Assessment Site  Knee    Right/Left Knee  Right    Right Knee Extension  0    Right Knee Flexion  115                   [facility] Adult PT Treatment/Exercise - 11/01/19 0001      Knee/Hip Exercises: Aerobic   Recumbent Bike  L3, seat 6-3 x15 min      Knee/Hip Exercises: Standing   Terminal Knee Extension  Strengthening;Right;2 sets;10 reps;Theraband    Theraband Level (Terminal Knee Extension)  Level 3 (Green)    Forward Step Up  Right;2 sets;10 reps;Hand Hold: 2;Step Height: 6"    Rocker Board  2 minutes      Knee/Hip Exercises: Supine   Short Arc Quad Sets  Strengthening;Right;2 sets;10 reps;Limitations    Short Arc Quad Sets Limitations  2#      Modalities   Modalities  Vasopneumatic      Vasopneumatic   Number Minutes Vasopneumatic   10 minutes    Vasopnuematic Location   Knee    Vasopneumatic Pressure  Medium    Vasopneumatic Temperature   34   edema              PT Short Term Goals - 10/12/19 1211      PT SHORT TERM GOAL #1   Title  STG's=LTG's.        PT Long Term Goals - 11/01/19 1202      PT LONG TERM GOAL #1   Title  Patient will be independent with HEP    Time  6    Period  Weeks    Status  On-going      PT LONG TERM GOAL #2   Title  Active right knee flexion to 115 degrees+ so the patient can perform functional tasks and do so with pain not > 2-3/10.    Time  6    Period  Weeks    Status  Achieved   AROM 95 degrees 10/23/19     PT LONG TERM GOAL #3   Title  Increase right knee strength to a solid 4+/5 to provide good stability for accomplishment of functional activities.    Time  6    Period  Weeks    Status  On-going      PT LONG TERM GOAL #4  Title  Full active knee extension in order to normalize gait.    Time  6     Period  Weeks    Status  Achieved   AROM -2 degrees 10/23/19     PT LONG TERM GOAL #5   Title  Perform a reciprocating stair gait with one railing with pain not > 2-3/10.    Time  6    Period  Weeks    Status  On-going            Plan - 11/01/19 1202    Clinical Impression Statement  Patient presented in clinic with reports of sleeping much better last night. Ambulated into the clinic with use of SBQC with good demonstration of gait pattern. Patient still hesitant with stairs but motivated to improve with stairs to be able to navigate [place] again. Patient reported discomfort with resisted SAQ. Overall better active R quad activation noted during therex. AROM of R knee measured as 0-115 deg and 2.5 cm difference with B knee edema measurements R > L. Normal vasopneumatic response noted following removal of the modality.    Personal Factors and Comorbidities  Comorbidity 1;Comorbidity 2    Comorbidities  H/o LBP, HTN, DDD.    Examination-Activity Limitations  Locomotion Level;Stairs;Other    Examination-Participation Restrictions  Other    Stability/Clinical Decision Making  Stable/Uncomplicated    Rehab Potential  Excellent    PT Frequency  3x / week    PT Duration  4 weeks    PT Treatment/Interventions  ADLs/Self Care Home Management;Cryotherapy;Electrical Stimulation;Iontophoresis 4mg /ml Dexamethasone;Moist Heat;Neuromuscular re-education;Manual techniques;Passive range of motion;Therapeutic activities;Therapeutic exercise;Functional mobility training;Stair training;Gait training;Vasopneumatic Device;Taping;Balance training    PT Next Visit Plan  Continue with quad activation training and ROM.    Consulted and Agree with Plan of Care  Patient       Patient will benefit from skilled therapeutic intervention in order to improve the following deficits and impairments:  Pain, Abnormal gait, Decreased activity tolerance, Decreased range of motion, Decreased mobility, Decreased  strength, Increased edema  Visit Diagnosis: Chronic pain of right knee  Stiffness of right knee, not elsewhere classified  Localized edema  Muscle weakness (generalized)     Problem List Patient Active Problem List   Diagnosis Date Noted  . Primary localized osteoarthritis of right knee 10/03/2019  . 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

## 2019-11-06 ENCOUNTER — Encounter: Payer: Self-pay | Admitting: Physical Therapy

## 2019-11-06 ENCOUNTER — Other Ambulatory Visit: Payer: Self-pay

## 2019-11-06 ENCOUNTER — Ambulatory Visit: Payer: Medicare HMO | Attending: Orthopaedic Surgery | Admitting: Physical Therapy

## 2019-11-06 DIAGNOSIS — G8929 Other chronic pain: Secondary | ICD-10-CM | POA: Insufficient documentation

## 2019-11-06 DIAGNOSIS — M6281 Muscle weakness (generalized): Secondary | ICD-10-CM | POA: Diagnosis present

## 2019-11-06 DIAGNOSIS — M25561 Pain in right knee: Secondary | ICD-10-CM | POA: Insufficient documentation

## 2019-11-06 DIAGNOSIS — M25661 Stiffness of right knee, not elsewhere classified: Secondary | ICD-10-CM | POA: Diagnosis present

## 2019-11-06 DIAGNOSIS — R600 Localized edema: Secondary | ICD-10-CM | POA: Insufficient documentation

## 2019-11-06 NOTE — Therapy (Signed)
[facility] [place], [place], [address] Phone: [phone]   Fax:  [phone]  Physical Therapy Treatment  Patient Details  Name: Julie Day MRN: [id number] Date of Birth: July 24, 1946 Referring Provider (PT): Melrose Nakayama MD   Encounter Date: 11/06/2019  PT End of Session - 11/06/19 1437    Visit Number  11    Number of Visits  12    Date for PT Re-Evaluation  01/10/20    Authorization Type  FOTO.    PT Start Time  1431    PT Stop Time  1525    PT Time Calculation (min)  54 min    Activity Tolerance  Patient tolerated treatment well    Behavior During Therapy  WFL for tasks assessed/performed       Past Medical History:  Diagnosis Date  . Allergic rhinitis   . Anginal pain ([place])    Dx as GERD  . Atypical mole 09/23/2017   Left Inner Thigh-Mild  . Atypical mole 09/23/2017   Right Axilla-Mild  . Atypical mole 09/05/2015   Center Abdomen-Severe (clear) (Dr. Nevada Crane)  . Atypical mole 04/14/2019   Right Thigh-Solar Lentigo  . Chronic cystitis   . Degenerative disc disease   . GERD (gastroesophageal reflux disease)   . Hemangioma of liver   . History of hiatal hernia 2017  . Hypertension   . SCC (squamous cell carcinoma) 09/05/2015   Left Dorsal Hand-Well Diff Keratoacanthoma (Dr. Nevada Crane) (free)  . SCC (squamous cell carcinoma) 04/14/2019   Left Outer Shin-Keratoacanthoma (treatment after biopsy)  . SCC (squamous cell carcinoma) 07/21/2019   Right Thigh-Keratoacanthoma (treatment Fluorouracil Cream)  . Squamous cell carcinoma of skin 09/23/2017   Right Upper Shin-Well Diff (Cx3,5FU)    Past Surgical History:  Procedure Laterality Date  . ABDOMINAL HYSTERECTOMY    . CHOLECYSTECTOMY    . COLON RESECTION N/A 05/31/2015   Procedure: LAPAROSCOPIC REPAIR OF HIATAL HERNIA  AND LAPAROSCOPIC NISSEN FUNDOPLICATION;  Surgeon: Fanny Skates, MD;  Location: [facility];  Service: General;  Laterality: N/A;  . ESOPHAGEAL  MANOMETRY N/A 02/27/2015   Procedure: ESOPHAGEAL MANOMETRY (EM);  Surgeon: Garlan Fair, MD;  [facility];  Service: Endoscopy;  Laterality: N/A;  . ESOPHAGOGASTRODUODENOSCOPY (EGD) WITH PROPOFOL N/A 02/26/2015   Procedure: ESOPHAGOGASTRODUODENOSCOPY (EGD) WITH PROPOFOL;  Surgeon: Garlan Fair, MD;  [facility];  Service: Endoscopy;  Laterality: N/A;  . HIATAL HERNIA REPAIR  05/31/2015  . TOTAL KNEE ARTHROPLASTY Right 10/03/2019   Procedure: RIGHT TOTAL KNEE ARTHROPLASTY;  Surgeon: Melrose Nakayama, MD;  [facility]: [facility];  Service: Orthopedics;  Laterality: Right;    There were no vitals filed for this visit.  Subjective Assessment - 11/06/19 1434    Subjective  COVID-19 screen performed prior to patient entering clinic. Reports that MD was very pleased and very pleased with ROM.    Pertinent History  H/o LBP, HTN, DDD.    How long can you walk comfortably?  Around house with walker.    Patient Stated Goals  Get back to normal.    Currently in Pain?  Yes    Pain Score  3     Pain Location  Knee    Pain Orientation  Right    Pain Descriptors / Indicators  Discomfort    Pain Type  Surgical pain    Pain Onset  More than a month ago    Pain Frequency  Intermittent         [facility] PT  Assessment - 11/06/19 0001      Assessment   Medical Diagnosis  S/p right total knee replacement.    Referring Provider (PT)  Melrose Nakayama MD    Onset Date/Surgical Date  10/03/19    Next MD Visit  12/04/2019      Restrictions   Weight Bearing Restrictions  No                   [facility] Adult PT Treatment/Exercise - 11/06/19 0001      Knee/Hip Exercises: Aerobic   Recumbent Bike  L3, seat 4 x15 min      Knee/Hip Exercises: Standing   Forward Lunges  Right;2 sets;10 reps;2 seconds    Forward Lunges Limitations  off 6" step    Hip Abduction  AROM;Both;2 sets;10 reps;Knee straight    Forward Step Up  Right;2 sets;10 reps;Hand Hold: 2;Step Height: 6"    Rocker  Board  3 minutes      Knee/Hip Exercises: Supine   Short Arc Quad Sets  Right    Short Arc Quad Sets Limitations  with NMR for quad activation training      Modalities   Administrator, arts  R VMO/quad    Electrical Stimulation Action  Sym. biphasic    Electrical Stimulation Parameters  10/10 x10 min    Electrical Stimulation Goals  Neuromuscular facilitation      Vasopneumatic   Number Minutes Vasopneumatic   10 minutes    Vasopnuematic Location   Knee    Vasopneumatic Pressure  Medium    Vasopneumatic Temperature   34   edema     Manual Therapy   Manual Therapy  Soft tissue mobilization    Soft tissue mobilization  R patella mobilizations med/lat, sup/inf; R incision mobilizations as well with patient education               PT Short Term Goals - 10/12/19 1211      PT SHORT TERM GOAL #1   Title  STG's=LTG's.        PT Long Term Goals - 11/01/19 1202      PT LONG TERM GOAL #1   Title  Patient will be independent with HEP    Time  6    Period  Weeks    Status  On-going      PT LONG TERM GOAL #2   Title  Active right knee flexion to 115 degrees+ so the patient can perform functional tasks and do so with pain not > 2-3/10.    Time  6    Period  Weeks    Status  Achieved   AROM 95 degrees 10/23/19     PT LONG TERM GOAL #3   Title  Increase right knee strength to a solid 4+/5 to provide good stability for accomplishment of functional activities.    Time  6    Period  Weeks    Status  On-going      PT LONG TERM GOAL #4   Title  Full active knee extension in order to normalize gait.    Time  6    Period  Weeks    Status  Achieved   AROM -2 degrees 10/23/19     PT LONG TERM GOAL #5   Title  Perform a reciprocating stair gait with one railing with pain not > 2-3/10.    Time  6  Period  Weeks    Status  On-going            Plan - 11/06/19 1515     Clinical Impression Statement  Patient presented in clinic with reports of stiffness but overall great report from MD visit. More functional exercises completed today with min to mod VCs to correct any compensatory strategies. Patient educated regarding incision mobilization techniques to utilize at home as well. Patient lacking full R quad activation and continued stimulated training for NMR of R quad. Normal modalities response noted following removal of the modalities.    Personal Factors and Comorbidities  Comorbidity 1;Comorbidity 2    Comorbidities  H/o LBP, HTN, DDD.    Examination-Activity Limitations  Locomotion Level;Stairs;Other    Examination-Participation Restrictions  Other    Stability/Clinical Decision Making  Stable/Uncomplicated    Rehab Potential  Excellent    PT Frequency  3x / week    PT Duration  4 weeks    PT Treatment/Interventions  ADLs/Self Care Home Management;Cryotherapy;Electrical Stimulation;Iontophoresis 4mg /ml Dexamethasone;Moist Heat;Neuromuscular re-education;Manual techniques;Passive range of motion;Therapeutic activities;Therapeutic exercise;Functional mobility training;Stair training;Gait training;Vasopneumatic Device;Taping;Balance training    PT Next Visit Plan  Continue with quad activation training and ROM.    Consulted and Agree with Plan of Care  Patient       Patient will benefit from skilled therapeutic intervention in order to improve the following deficits and impairments:  Pain, Abnormal gait, Decreased activity tolerance, Decreased range of motion, Decreased mobility, Decreased strength, Increased edema  Visit Diagnosis: Chronic pain of right knee  Stiffness of right knee, not elsewhere classified  Localized edema  Muscle weakness (generalized)     Problem List Patient Active Problem List   Diagnosis Date Noted  . Primary localized osteoarthritis of right knee 10/03/2019  . 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

## 2019-11-08 ENCOUNTER — Ambulatory Visit: Payer: Medicare HMO | Admitting: Physical Therapy

## 2019-11-08 ENCOUNTER — Encounter: Payer: Self-pay | Admitting: Physical Therapy

## 2019-11-08 ENCOUNTER — Other Ambulatory Visit: Payer: Self-pay

## 2019-11-08 DIAGNOSIS — G8929 Other chronic pain: Secondary | ICD-10-CM

## 2019-11-08 DIAGNOSIS — M25661 Stiffness of right knee, not elsewhere classified: Secondary | ICD-10-CM

## 2019-11-08 DIAGNOSIS — R600 Localized edema: Secondary | ICD-10-CM

## 2019-11-08 DIAGNOSIS — M6281 Muscle weakness (generalized): Secondary | ICD-10-CM

## 2019-11-08 DIAGNOSIS — M25561 Pain in right knee: Secondary | ICD-10-CM | POA: Diagnosis not present

## 2019-11-08 NOTE — Therapy (Signed)
[facility] [place], [place], [address] Phone: [phone]   Fax:  [phone]  Physical Therapy Treatment  Patient Details  Name: Julie Day MRN: [id number] Date of Birth: 1946/06/19 Referring Provider (PT): Melrose Nakayama MD   Encounter Date: 11/08/2019  PT End of Session - 11/08/19 1117    Visit Number  12    Number of Visits  12    Date for PT Re-Evaluation  01/10/20    Authorization Type  FOTO.    PT Start Time  1114    PT Stop Time  1203    PT Time Calculation (min)  49 min    Activity Tolerance  Patient tolerated treatment well    Behavior During Therapy  WFL for tasks assessed/performed       Past Medical History:  Diagnosis Date  . Allergic rhinitis   . Anginal pain ([place])    Dx as GERD  . Atypical mole 09/23/2017   Left Inner Thigh-Mild  . Atypical mole 09/23/2017   Right Axilla-Mild  . Atypical mole 09/05/2015   Center Abdomen-Severe (clear) (Dr. Nevada Crane)  . Atypical mole 04/14/2019   Right Thigh-Solar Lentigo  . Chronic cystitis   . Degenerative disc disease   . GERD (gastroesophageal reflux disease)   . Hemangioma of liver   . History of hiatal hernia 2017  . Hypertension   . SCC (squamous cell carcinoma) 09/05/2015   Left Dorsal Hand-Well Diff Keratoacanthoma (Dr. Nevada Crane) (free)  . SCC (squamous cell carcinoma) 04/14/2019   Left Outer Shin-Keratoacanthoma (treatment after biopsy)  . SCC (squamous cell carcinoma) 07/21/2019   Right Thigh-Keratoacanthoma (treatment Fluorouracil Cream)  . Squamous cell carcinoma of skin 09/23/2017   Right Upper Shin-Well Diff (Cx3,5FU)    Past Surgical History:  Procedure Laterality Date  . ABDOMINAL HYSTERECTOMY    . CHOLECYSTECTOMY    . COLON RESECTION N/A 05/31/2015   Procedure: LAPAROSCOPIC REPAIR OF HIATAL HERNIA  AND LAPAROSCOPIC NISSEN FUNDOPLICATION;  Surgeon: Fanny Skates, MD;  Location: [facility];  Service: General;  Laterality: N/A;  . ESOPHAGEAL  MANOMETRY N/A 02/27/2015   Procedure: ESOPHAGEAL MANOMETRY (EM);  Surgeon: Garlan Fair, MD;  [facility];  Service: Endoscopy;  Laterality: N/A;  . ESOPHAGOGASTRODUODENOSCOPY (EGD) WITH PROPOFOL N/A 02/26/2015   Procedure: ESOPHAGOGASTRODUODENOSCOPY (EGD) WITH PROPOFOL;  Surgeon: Garlan Fair, MD;  [facility];  Service: Endoscopy;  Laterality: N/A;  . HIATAL HERNIA REPAIR  05/31/2015  . TOTAL KNEE ARTHROPLASTY Right 10/03/2019   Procedure: RIGHT TOTAL KNEE ARTHROPLASTY;  Surgeon: Melrose Nakayama, MD;  [facility];  Service: Orthopedics;  Laterality: Right;    There were no vitals filed for this visit.  Subjective Assessment - 11/08/19 1115    Subjective  COVID-19 screen performed prior to patient entering clinic. Reports that she had a great day yesterday but has some pain.    Pertinent History  H/o LBP, HTN, DDD.    How long can you walk comfortably?  Around house with walker.    Patient Stated Goals  Get back to normal.    Currently in Pain?  Yes    Pain Score  5     Pain Location  Knee    Pain Orientation  Right    Pain Descriptors / Indicators  Sore;Discomfort    Pain Type  Surgical pain    Pain Onset  More than a month ago    Pain Frequency  Intermittent         [facility]  PT Assessment - 11/08/19 0001      Assessment   Medical Diagnosis  S/p right total knee replacement.    Referring Provider (PT)  Melrose Nakayama MD    Onset Date/Surgical Date  10/03/19    Next MD Visit  12/04/2019      Restrictions   Weight Bearing Restrictions  No                   [facility] Adult PT Treatment/Exercise - 11/08/19 0001      Knee/Hip Exercises: Stretches   Passive Hamstring Stretch  Right;3 reps;30 seconds      Knee/Hip Exercises: Aerobic   Recumbent Bike  L3, seat 2-1 x15 min      Knee/Hip Exercises: Standing   Forward Lunges  Right;2 sets;10 reps;2 seconds    Forward Lunges Limitations  off 6" step    Terminal Knee Extension   Strengthening;Right;Theraband    Theraband Level (Terminal Knee Extension)  Level 3 (Green)    Terminal Knee Extension Limitations  Attempted but stopped after 3 reps due to reports of pain    Hip Abduction  AROM;Both;2 sets;10 reps;Knee straight    Forward Step Up  Right;2 sets;10 reps;Hand Hold: 2;Step Height: 6"    Rocker Board  3 minutes      Knee/Hip Exercises: Seated   Long Arc Quad  Strengthening;Right;2 sets;10 reps;Weights    Long Arc Quad Weight  2 lbs.      Modalities   Modalities  Theme park manager Action  Pre-Mod    Electrical Stimulation Parameters  80-150 hz x10 min    Electrical Stimulation Goals  Pain;Edema      Vasopneumatic   Number Minutes Vasopneumatic   10 minutes    Vasopnuematic Location   Knee    Vasopneumatic Pressure  Medium    Vasopneumatic Temperature   34               PT Short Term Goals - 10/12/19 1211      PT SHORT TERM GOAL #1   Title  STG's=LTG's.        PT Long Term Goals - 11/01/19 1202      PT LONG TERM GOAL #1   Title  Patient will be independent with HEP    Time  6    Period  Weeks    Status  On-going      PT LONG TERM GOAL #2   Title  Active right knee flexion to 115 degrees+ so the patient can perform functional tasks and do so with pain not > 2-3/10.    Time  6    Period  Weeks    Status  Achieved   AROM 95 degrees 10/23/19     PT LONG TERM GOAL #3   Title  Increase right knee strength to a solid 4+/5 to provide good stability for accomplishment of functional activities.    Time  6    Period  Weeks    Status  On-going      PT LONG TERM GOAL #4   Title  Full active knee extension in order to normalize gait.    Time  6    Period  Weeks    Status  Achieved   AROM -2 degrees 10/23/19     PT LONG TERM GOAL #5   Title  Perform a reciprocating stair gait with  one railing with pain not > 2-3/10.     Time  6    Period  Weeks    Status  On-going            Plan - 11/08/19 1208    Clinical Impression Statement  Patient presented in clinic with reports of increased discomfort in R knee today. Patient progressed through ROM and strengthening exercises with reports of discomfort with lunges as well as TKE which was terminated early. Quad weakness evident but ovreall improving as lack of full R knee extensiobn observed during LAQ. Normal modalities response noted following removal of the modalities.    Personal Factors and Comorbidities  Comorbidity 1;Comorbidity 2    Comorbidities  H/o LBP, HTN, DDD.    Examination-Activity Limitations  Locomotion Level;Stairs;Other    Examination-Participation Restrictions  Other    Stability/Clinical Decision Making  Stable/Uncomplicated    Rehab Potential  Excellent    PT Frequency  3x / week    PT Duration  4 weeks    PT Treatment/Interventions  ADLs/Self Care Home Management;Cryotherapy;Electrical Stimulation;Iontophoresis 4mg /ml Dexamethasone;Moist Heat;Neuromuscular re-education;Manual techniques;Passive range of motion;Therapeutic activities;Therapeutic exercise;Functional mobility training;Stair training;Gait training;Vasopneumatic Device;Taping;Balance training    PT Next Visit Plan  Continue with quad activation training and ROM.    Consulted and Agree with Plan of Care  Patient       Patient will benefit from skilled therapeutic intervention in order to improve the following deficits and impairments:  Pain, Abnormal gait, Decreased activity tolerance, Decreased range of motion, Decreased mobility, Decreased strength, Increased edema  Visit Diagnosis: Chronic pain of right knee  Stiffness of right knee, not elsewhere classified  Localized edema  Muscle weakness (generalized)     Problem List Patient Active Problem List   Diagnosis Date Noted  . Primary localized osteoarthritis of right knee 10/03/2019  . Paraesophageal hernia  with obstruction but no gangrene 05/31/2015    Standley Brooking, PTA 11/08/2019, 12:12 PM  [facility] [address] [place], [place], [address] Phone: [phone]   Fax:  [phone]  Name: Keshia Hoek MRN: [id number] Date of Birth: 1946/06/02

## 2019-11-10 ENCOUNTER — Other Ambulatory Visit: Payer: Self-pay

## 2019-11-10 ENCOUNTER — Encounter: Payer: Self-pay | Admitting: Physical Therapy

## 2019-11-10 ENCOUNTER — Ambulatory Visit: Payer: Medicare HMO | Admitting: Physical Therapy

## 2019-11-10 DIAGNOSIS — M25661 Stiffness of right knee, not elsewhere classified: Secondary | ICD-10-CM

## 2019-11-10 DIAGNOSIS — M6281 Muscle weakness (generalized): Secondary | ICD-10-CM

## 2019-11-10 DIAGNOSIS — M25561 Pain in right knee: Secondary | ICD-10-CM

## 2019-11-10 DIAGNOSIS — G8929 Other chronic pain: Secondary | ICD-10-CM

## 2019-11-10 DIAGNOSIS — R600 Localized edema: Secondary | ICD-10-CM

## 2019-11-10 NOTE — Therapy (Signed)
[facility] [place], [place], [address] Phone: [phone]   Fax:  [phone]  Physical Therapy Treatment  Patient Details  Name: Julie Day MRN: [id number] Date of Birth: [birth year] Referring Provider (PT): Melrose Nakayama MD   Encounter Date: 11/10/2019  PT End of Session - 11/10/19 1211    Visit Number  13    Number of Visits  12    Date for PT Re-Evaluation  01/10/20    Authorization Type  FOTO.    PT Start Time  1115    PT Stop Time  1215    PT Time Calculation (min)  60 min    Activity Tolerance  Patient tolerated treatment well    Behavior During Therapy  WFL for tasks assessed/performed       Past Medical History:  Diagnosis Date  . Allergic rhinitis   . Anginal pain ([place])    Dx as GERD  . Atypical mole 09/23/2017   Left Inner Thigh-Mild  . Atypical mole 09/23/2017   Right Axilla-Mild  . Atypical mole 09/05/2015   Center Abdomen-Severe (clear) (Dr. Nevada Crane)  . Atypical mole 04/14/2019   Right Thigh-Solar Lentigo  . Chronic cystitis   . Degenerative disc disease   . GERD (gastroesophageal reflux disease)   . Hemangioma of liver   . History of hiatal hernia 2017  . Hypertension   . SCC (squamous cell carcinoma) 09/05/2015   Left Dorsal Hand-Well Diff Keratoacanthoma (Dr. Nevada Crane) (free)  . SCC (squamous cell carcinoma) 04/14/2019   Left Outer Shin-Keratoacanthoma (treatment after biopsy)  . SCC (squamous cell carcinoma) 07/21/2019   Right Thigh-Keratoacanthoma (treatment Fluorouracil Cream)  . Squamous cell carcinoma of skin 09/23/2017   Right Upper Shin-Well Diff (Cx3,5FU)    Past Surgical History:  Procedure Laterality Date  . ABDOMINAL HYSTERECTOMY    . CHOLECYSTECTOMY    . COLON RESECTION N/A 05/31/2015   Procedure: LAPAROSCOPIC REPAIR OF HIATAL HERNIA  AND LAPAROSCOPIC NISSEN FUNDOPLICATION;  Surgeon: Fanny Skates, MD;  Location: [facility];  Service: General;  Laterality: N/A;  . ESOPHAGEAL  MANOMETRY N/A 02/27/2015   Procedure: ESOPHAGEAL MANOMETRY (EM);  Surgeon: Garlan Fair, MD;  [facility];  Service: Endoscopy;  Laterality: N/A;  . ESOPHAGOGASTRODUODENOSCOPY (EGD) WITH PROPOFOL N/A 02/26/2015   Procedure: ESOPHAGOGASTRODUODENOSCOPY (EGD) WITH PROPOFOL;  Surgeon: Garlan Fair, MD;  [facility];  Service: Endoscopy;  Laterality: N/A;  . HIATAL HERNIA REPAIR  05/31/2015  . TOTAL KNEE ARTHROPLASTY Right 10/03/2019   Procedure: RIGHT TOTAL KNEE ARTHROPLASTY;  Surgeon: Melrose Nakayama, MD;  [facility];  Service: Orthopedics;  Laterality: Right;    There were no vitals filed for this visit.  Subjective Assessment - 11/10/19 1130    Subjective  COVID-19 screen performed prior to patient entering clinic. Reports that she had a great day yesterday and slept great night last night.    Pertinent History  H/o LBP, HTN, DDD.    How long can you walk comfortably?  Around house with walker.    Patient Stated Goals  Get back to normal.    Currently in Pain?  No/denies         [facility] PT Assessment - 11/10/19 0001      Assessment   Medical Diagnosis  S/p right total knee replacement.    Referring Provider (PT)  Melrose Nakayama MD    Onset Date/Surgical Date  10/03/19    Next MD Visit  12/04/2019      Restrictions  Weight Bearing Restrictions  No      ROM / Strength   AROM / PROM / Strength  AROM      AROM   Overall AROM   Within functional limits for tasks performed    AROM Assessment Site  Knee    Right/Left Knee  Right    Right Knee Extension  0    Right Knee Flexion  123                   [facility] Adult PT Treatment/Exercise - 11/10/19 0001      Knee/Hip Exercises: Aerobic   Recumbent Bike  L3, seat 2-1 x15 min      Knee/Hip Exercises: Standing   Forward Lunges  Right;2 sets;10 reps;2 seconds    Forward Lunges Limitations  off 6" step    Forward Step Up  Right;2 sets;10 reps;Hand Hold: 1;Step Height: 6"    Rocker Board  3  minutes      Knee/Hip Exercises: Seated   Long Arc Quad  Strengthening;Right;2 sets;10 reps;Weights    Long Arc Quad Weight  3 lbs.      Modalities   Modalities  Psychologist, educational Location  R knee    Electrical Stimulation Action  Pre-Mod    Electrical Stimulation Parameters  80-150 hz x15 min    Electrical Stimulation Goals  Edema      Vasopneumatic   Number Minutes Vasopneumatic   15 minutes    Vasopnuematic Location   Knee    Vasopneumatic Pressure  Medium    Vasopneumatic Temperature   34      Manual Therapy   Manual Therapy  Passive ROM;Soft tissue mobilization    Soft tissue mobilization  R incision mobilizations as well with patient education    Passive ROM  PROM of R knee into flexion, ext with holds at end range               PT Short Term Goals - 10/12/19 1211      PT SHORT TERM GOAL #1   Title  STG's=LTG's.        PT Long Term Goals - 11/01/19 1202      PT LONG TERM GOAL #1   Title  Patient will be independent with HEP    Time  6    Period  Weeks    Status  On-going      PT LONG TERM GOAL #2   Title  Active right knee flexion to 115 degrees+ so the patient can perform functional tasks and do so with pain not > 2-3/10.    Time  6    Period  Weeks    Status  Achieved   AROM 95 degrees 10/23/19     PT LONG TERM GOAL #3   Title  Increase right knee strength to a solid 4+/5 to provide good stability for accomplishment of functional activities.    Time  6    Period  Weeks    Status  On-going      PT LONG TERM GOAL #4   Title  Full active knee extension in order to normalize gait.    Time  6    Period  Weeks    Status  Achieved   AROM -2 degrees 10/23/19     PT LONG TERM GOAL #5   Title  Perform a reciprocating stair gait with one railing with pain not >  2-3/10.    Time  6    Period  Weeks    Status  On-going            Plan - 11/10/19 1223    Clinical  Impression Statement  Patient presented in clinic with no reports of R knee pain and good rest last night. Patient progressed to more functional activiites with one UE support to avoid overcompensation with UEs. Patient noted that forward step ups with one UE support much more difficult and some pain. Quad weakness still notable as well. AROM of R knee measuared as 0-123 deg today. Good R knee incision mobility noted as well. Normal modalities response noted following removal of the modalities.    Personal Factors and Comorbidities  Comorbidity 1;Comorbidity 2    Comorbidities  H/o LBP, HTN, DDD.    Examination-Activity Limitations  Locomotion Level;Stairs;Other    Examination-Participation Restrictions  Other    Stability/Clinical Decision Making  Stable/Uncomplicated    Rehab Potential  Excellent    PT Frequency  3x / week    PT Duration  4 weeks    PT Treatment/Interventions  ADLs/Self Care Home Management;Cryotherapy;Electrical Stimulation;Iontophoresis 4mg /ml Dexamethasone;Moist Heat;Neuromuscular re-education;Manual techniques;Passive range of motion;Therapeutic activities;Therapeutic exercise;Functional mobility training;Stair training;Gait training;Vasopneumatic Device;Taping;Balance training    PT Next Visit Plan  Continue with quad activation training and ROM.    Consulted and Agree with Plan of Care  Patient       Patient will benefit from skilled therapeutic intervention in order to improve the following deficits and impairments:  Pain, Abnormal gait, Decreased activity tolerance, Decreased range of motion, Decreased mobility, Decreased strength, Increased edema  Visit Diagnosis: Chronic pain of right knee  Stiffness of right knee, not elsewhere classified  Localized edema  Muscle weakness (generalized)     Problem List Patient Active Problem List   Diagnosis Date Noted  . Primary localized osteoarthritis of right knee 10/03/2019  . Paraesophageal hernia with obstruction  but no gangrene 05/31/2015    Standley Brooking, PTA 11/10/2019, 12:34 PM  [facility] [address] [place], [place], [address] Phone: [phone]   Fax:  [phone]  Name: Julie Day MRN: [id number] Date of Birth: 17-Mar-1946

## 2019-11-13 ENCOUNTER — Ambulatory Visit: Payer: Medicare HMO | Admitting: Physical Therapy

## 2019-11-13 ENCOUNTER — Encounter: Payer: Self-pay | Admitting: Physical Therapy

## 2019-11-13 ENCOUNTER — Other Ambulatory Visit: Payer: Self-pay

## 2019-11-13 DIAGNOSIS — M6281 Muscle weakness (generalized): Secondary | ICD-10-CM

## 2019-11-13 DIAGNOSIS — R600 Localized edema: Secondary | ICD-10-CM

## 2019-11-13 DIAGNOSIS — M25661 Stiffness of right knee, not elsewhere classified: Secondary | ICD-10-CM

## 2019-11-13 DIAGNOSIS — G8929 Other chronic pain: Secondary | ICD-10-CM

## 2019-11-13 DIAGNOSIS — M25561 Pain in right knee: Secondary | ICD-10-CM | POA: Diagnosis not present

## 2019-11-13 NOTE — Therapy (Signed)
[facility] [place], [place], [address] Phone: [phone]   Fax:  [phone]  Physical Therapy Treatment  Patient Details  Name: Julie Day MRN: [id number] Date of Birth: 09/16/1945 Referring Provider (PT): Melrose Nakayama MD   Encounter Date: 11/13/2019  PT End of Session - 11/13/19 1130    Visit Number  14    Number of Visits  12    Date for PT Re-Evaluation  01/10/20    Authorization Type  FOTO.    PT Start Time  1118    PT Stop Time  1209    PT Time Calculation (min)  51 min    Activity Tolerance  Patient tolerated treatment well    Behavior During Therapy  WFL for tasks assessed/performed       Past Medical History:  Diagnosis Date  . Allergic rhinitis   . Anginal pain ([place])    Dx as GERD  . Atypical mole 09/23/2017   Left Inner Thigh-Mild  . Atypical mole 09/23/2017   Right Axilla-Mild  . Atypical mole 09/05/2015   Center Abdomen-Severe (clear) (Dr. Nevada Crane)  . Atypical mole 04/14/2019   Right Thigh-Solar Lentigo  . Chronic cystitis   . Degenerative disc disease   . GERD (gastroesophageal reflux disease)   . Hemangioma of liver   . History of hiatal hernia 2017  . Hypertension   . SCC (squamous cell carcinoma) 09/05/2015   Left Dorsal Hand-Well Diff Keratoacanthoma (Dr. Nevada Crane) (free)  . SCC (squamous cell carcinoma) 04/14/2019   Left Outer Shin-Keratoacanthoma (treatment after biopsy)  . SCC (squamous cell carcinoma) 07/21/2019   Right Thigh-Keratoacanthoma (treatment Fluorouracil Cream)  . Squamous cell carcinoma of skin 09/23/2017   Right Upper Shin-Well Diff (Cx3,5FU)    Past Surgical History:  Procedure Laterality Date  . ABDOMINAL HYSTERECTOMY    . CHOLECYSTECTOMY    . COLON RESECTION N/A 05/31/2015   Procedure: LAPAROSCOPIC REPAIR OF HIATAL HERNIA  AND LAPAROSCOPIC NISSEN FUNDOPLICATION;  Surgeon: Fanny Skates, MD;  Location: [place];  Service: General;  Laterality: N/A;  . ESOPHAGEAL  MANOMETRY N/A 02/27/2015   Procedure: ESOPHAGEAL MANOMETRY (EM);  Surgeon: Garlan Fair, MD;  [facility];  Service: Endoscopy;  Laterality: N/A;  . ESOPHAGOGASTRODUODENOSCOPY (EGD) WITH PROPOFOL N/A 02/26/2015   Procedure: ESOPHAGOGASTRODUODENOSCOPY (EGD) WITH PROPOFOL;  Surgeon: Garlan Fair, MD;  [facility];  Service: Endoscopy;  Laterality: N/A;  . HIATAL HERNIA REPAIR  05/31/2015  . TOTAL KNEE ARTHROPLASTY Right 10/03/2019   Procedure: RIGHT TOTAL KNEE ARTHROPLASTY;  Surgeon: Melrose Nakayama, MD;  [facility];  Service: Orthopedics;  Laterality: Right;    There were no vitals filed for this visit.  Subjective Assessment - 11/13/19 1129    Subjective  COVID-19 screen performed prior to patient entering clinic. Reports feeling so/so today.    Pertinent History  H/o LBP, HTN, DDD.    How long can you walk comfortably?  Around house with walker.    Patient Stated Goals  Get back to normal.    Currently in Pain?  Yes    Pain Score  4     Pain Location  Knee    Pain Orientation  Right    Pain Descriptors / Indicators  Sore;Aching    Pain Type  Surgical pain    Pain Onset  More than a month ago    Pain Frequency  Intermittent         [facility] PT Assessment - 11/13/19 0001  Assessment   Medical Diagnosis  S/p right total knee replacement.    Referring Provider (PT)  Melrose Nakayama MD    Onset Date/Surgical Date  10/03/19    Next MD Visit  12/04/2019      Restrictions   Weight Bearing Restrictions  No                   [facility] Adult PT Treatment/Exercise - 11/13/19 0001      Knee/Hip Exercises: Aerobic   Recumbent Bike  L3, seat 3 x15 min      Knee/Hip Exercises: Standing   Forward Lunges  Right;2 sets;10 reps;2 seconds    Forward Lunges Limitations  off 6" step    Lateral Step Up  Right;2 sets;10 reps;Hand Hold: 2;Step Height: 6"    Forward Step Up  Right;2 sets;10 reps;Hand Hold: 1;Step Height: 6"    Rocker Board  3 minutes       Knee/Hip Exercises: Seated   Long Arc Quad  Strengthening;Right;2 sets;10 reps;Weights    Long Arc Quad Weight  4 lbs.      Knee/Hip Exercises: Supine   Straight Leg Raises  AROM;Right;10 reps    Straight Leg Raises Limitations  extensor lag notable      Modalities   Modalities  Vasopneumatic;Electrical Stimulation      Electrical Stimulation   Electrical Stimulation Location  R knee    Electrical Stimulation Action  IFC    Electrical Stimulation Parameters  80-150 hz x15 min    Electrical Stimulation Goals  Pain;Edema      Vasopneumatic   Number Minutes Vasopneumatic   15 minutes    Vasopnuematic Location   Knee    Vasopneumatic Pressure  Medium    Vasopneumatic Temperature   34               PT Short Term Goals - 10/12/19 1211      PT SHORT TERM GOAL #1   Title  STG's=LTG's.        PT Long Term Goals - 11/01/19 1202      PT LONG TERM GOAL #1   Title  Patient will be independent with HEP    Time  6    Period  Weeks    Status  On-going      PT LONG TERM GOAL #2   Title  Active right knee flexion to 115 degrees+ so the patient can perform functional tasks and do so with pain not > 2-3/10.    Time  6    Period  Weeks    Status  Achieved   AROM 95 degrees 10/23/19     PT LONG TERM GOAL #3   Title  Increase right knee strength to a solid 4+/5 to provide good stability for accomplishment of functional activities.    Time  6    Period  Weeks    Status  On-going      PT LONG TERM GOAL #4   Title  Full active knee extension in order to normalize gait.    Time  6    Period  Weeks    Status  Achieved   AROM -2 degrees 10/23/19     PT LONG TERM GOAL #5   Title  Perform a reciprocating stair gait with one railing with pain not > 2-3/10.    Time  6    Period  Weeks    Status  On-going  Plan - 11/13/19 1212    Clinical Impression Statement  Patient presented in clinic with minimal reports of R knee pain. Patient now sleeping better and  only waking once a night. Patient progressed through more functional step ups without complaint of pain. Extensor lag notable with LAQ and SLR. Normal modalties response noted following removal of the modalities.    Personal Factors and Comorbidities  Comorbidity 1;Comorbidity 2    Comorbidities  H/o LBP, HTN, DDD.    Examination-Activity Limitations  Locomotion Level;Stairs;Other    Examination-Participation Restrictions  Other    Stability/Clinical Decision Making  Stable/Uncomplicated    Rehab Potential  Excellent    PT Frequency  3x / week    PT Duration  4 weeks    PT Treatment/Interventions  ADLs/Self Care Home Management;Cryotherapy;Electrical Stimulation;Iontophoresis 4mg /ml Dexamethasone;Moist Heat;Neuromuscular re-education;Manual techniques;Passive range of motion;Therapeutic activities;Therapeutic exercise;Functional mobility training;Stair training;Gait training;Vasopneumatic Device;Taping;Balance training    PT Next Visit Plan  Continue with quad activation training and ROM.    Consulted and Agree with Plan of Care  Patient       Patient will benefit from skilled therapeutic intervention in order to improve the following deficits and impairments:  Pain, Abnormal gait, Decreased activity tolerance, Decreased range of motion, Decreased mobility, Decreased strength, Increased edema  Visit Diagnosis: Chronic pain of right knee  Stiffness of right knee, not elsewhere classified  Localized edema  Muscle weakness (generalized)     Problem List Patient Active Problem List   Diagnosis Date Noted  . Primary localized osteoarthritis of right knee 10/03/2019  . 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

## 2019-11-15 ENCOUNTER — Ambulatory Visit: Payer: Medicare HMO | Admitting: Physical Therapy

## 2019-11-15 ENCOUNTER — Other Ambulatory Visit: Payer: Self-pay

## 2019-11-15 ENCOUNTER — Encounter: Payer: Self-pay | Admitting: Physical Therapy

## 2019-11-15 DIAGNOSIS — M25561 Pain in right knee: Secondary | ICD-10-CM | POA: Diagnosis not present

## 2019-11-15 DIAGNOSIS — M25661 Stiffness of right knee, not elsewhere classified: Secondary | ICD-10-CM

## 2019-11-15 DIAGNOSIS — G8929 Other chronic pain: Secondary | ICD-10-CM

## 2019-11-15 DIAGNOSIS — M6281 Muscle weakness (generalized): Secondary | ICD-10-CM

## 2019-11-15 DIAGNOSIS — R600 Localized edema: Secondary | ICD-10-CM

## 2019-11-15 NOTE — Therapy (Signed)
[facility] [place], [place], [address] Phone: [phone]   Fax:  [phone]  Physical Therapy Treatment  Patient Details  Name: Julie Day MRN: [id number] Date of Birth: 10-19-1945 Referring Provider (PT): Melrose Nakayama MD   Encounter Date: 11/15/2019  PT End of Session - 11/15/19 1126    Visit Number  15    Number of Visits  12    Date for PT Re-Evaluation  01/10/20    Authorization Type  FOTO.    PT Start Time  1120    PT Stop Time  1159    PT Time Calculation (min)  39 min    Activity Tolerance  Patient tolerated treatment well    Behavior During Therapy  WFL for tasks assessed/performed       Past Medical History:  Diagnosis Date  . Allergic rhinitis   . Anginal pain ([place])    Dx as GERD  . Atypical mole 09/23/2017   Left Inner Thigh-Mild  . Atypical mole 09/23/2017   Right Axilla-Mild  . Atypical mole 09/05/2015   Center Abdomen-Severe (clear) (Dr. Nevada Crane)  . Atypical mole 04/14/2019   Right Thigh-Solar Lentigo  . Chronic cystitis   . Degenerative disc disease   . GERD (gastroesophageal reflux disease)   . Hemangioma of liver   . History of hiatal hernia 2017  . Hypertension   . SCC (squamous cell carcinoma) 09/05/2015   Left Dorsal Hand-Well Diff Keratoacanthoma (Dr. Nevada Crane) (free)  . SCC (squamous cell carcinoma) 04/14/2019   Left Outer Shin-Keratoacanthoma (treatment after biopsy)  . SCC (squamous cell carcinoma) 07/21/2019   Right Thigh-Keratoacanthoma (treatment Fluorouracil Cream)  . Squamous cell carcinoma of skin 09/23/2017   Right Upper Shin-Well Diff (Cx3,5FU)    Past Surgical History:  Procedure Laterality Date  . ABDOMINAL HYSTERECTOMY    . CHOLECYSTECTOMY    . COLON RESECTION N/A 05/31/2015   Procedure: LAPAROSCOPIC REPAIR OF HIATAL HERNIA  AND LAPAROSCOPIC NISSEN FUNDOPLICATION;  Surgeon: Fanny Skates, MD;  Location: [facility];  Service: General;  Laterality: N/A;  . ESOPHAGEAL  MANOMETRY N/A 02/27/2015   Procedure: ESOPHAGEAL MANOMETRY (EM);  Surgeon: Garlan Fair, MD;  [facility];  Service: Endoscopy;  Laterality: N/A;  . ESOPHAGOGASTRODUODENOSCOPY (EGD) WITH PROPOFOL N/A 02/26/2015   Procedure: ESOPHAGOGASTRODUODENOSCOPY (EGD) WITH PROPOFOL;  Surgeon: Garlan Fair, MD;  [facility];  Service: Endoscopy;  Laterality: N/A;  . HIATAL HERNIA REPAIR  05/31/2015  . TOTAL KNEE ARTHROPLASTY Right 10/03/2019   Procedure: RIGHT TOTAL KNEE ARTHROPLASTY;  Surgeon: Melrose Nakayama, MD;  [facility];  Service: Orthopedics;  Laterality: Right;    There were no vitals filed for this visit.  Subjective Assessment - 11/15/19 1121    Subjective  COVID-19 screen performed prior to patient entering clinic. Reports having pain after last treatment and has been experiencing more L knee pain as well. Reports that she went to get her hair done yesterday and was there about two hours. While waiting she would walk around but has had more pain since and BLE pain. Patient reports not sleeping well due to pain and very discouraged and concerned.    Pertinent History  H/o LBP, HTN, DDD.    How long can you walk comfortably?  Around house with walker.    Patient Stated Goals  Get back to normal.    Currently in Pain?  Yes    Pain Score  7     Pain Location  Knee  Pain Orientation  Right    Pain Descriptors / Indicators  Discomfort    Pain Type  Surgical pain    Pain Onset  More than a month ago    Pain Frequency  Constant         [facility] PT Assessment - 11/15/19 0001      Assessment   Medical Diagnosis  S/p right total knee replacement.    Referring Provider (PT)  Melrose Nakayama MD    Onset Date/Surgical Date  10/03/19    Next MD Visit  12/04/2019      Restrictions   Weight Bearing Restrictions  No                   [facility] Adult PT Treatment/Exercise - 11/15/19 0001      Knee/Hip Exercises: Aerobic   Nustep  L5, seat 5-3 x12 min       Knee/Hip Exercises: Standing   Hip Abduction  AROM;Both;15 reps;Knee straight    Forward Step Up  Right;2 sets;10 reps;Hand Hold: 1;Step Height: 6"    Rocker Board  2 minutes      Knee/Hip Exercises: Supine   Short Arc Quad Sets  Strengthening;Right;20 reps;Limitations    Short Arc Quad Sets Limitations  2#      Modalities   Modalities  Vasopneumatic;Electrical Medical illustrator  IFC    Electrical Stimulation Parameters  80-150 hz x10 min    Electrical Stimulation Goals  Pain;Edema      Vasopneumatic   Number Minutes Vasopneumatic   10 minutes    Vasopnuematic Location   Knee    Vasopneumatic Pressure  Medium    Vasopneumatic Temperature   34      Manual Therapy   Manual Therapy  Soft tissue mobilization;Edema management    Edema Management  Edema massage to R knee to reduce edema present    Soft tissue mobilization  R knee incisions mobilizations and STW to distal R ITB                PT Short Term Goals - 10/12/19 1211      PT SHORT TERM GOAL #1   Title  STG's=LTG's.        PT Long Term Goals - 11/01/19 1202      PT LONG TERM GOAL #1   Title  Patient will be independent with HEP    Time  6    Period  Weeks    Status  On-going      PT LONG TERM GOAL #2   Title  Active right knee flexion to 115 degrees+ so the patient can perform functional tasks and do so with pain not > 2-3/10.    Time  6    Period  Weeks    Status  Achieved   AROM 95 degrees 10/23/19     PT LONG TERM GOAL #3   Title  Increase right knee strength to a solid 4+/5 to provide good stability for accomplishment of functional activities.    Time  6    Period  Weeks    Status  On-going      PT LONG TERM GOAL #4   Title  Full active knee extension in order to normalize gait.    Time  6    Period  Weeks    Status  Achieved   AROM -2 degrees 10/23/19  PT LONG TERM GOAL #5   Title   Perform a reciprocating stair gait with one railing with pain not > 2-3/10.    Time  6    Period  Weeks    Status  On-going            Plan - 11/15/19 1201    Clinical Impression Statement  Patient presented in clinic concerned regarding pain in RLE but also BLE since going to hairdresser yesterday. Patient put on Nustep with RLE activity and UE acitivity only due to L knee pain. Low level functional strengthening provided today due to reports of increased pain experienced following PT. Patient continues to demonstrate a minimal R extensor lag with lightly resisted SAQ. Edema massage provided to R knee to reduce the moderate edema present. No excessive edema present surrounding R ankle or calf. Minimal increased tone of R ITB palpable. Good incision mobility noted as well. Normal modalities response noted following removal of the modalities.    Personal Factors and Comorbidities  Comorbidity 1;Comorbidity 2    Comorbidities  H/o LBP, HTN, DDD.    Examination-Activity Limitations  Locomotion Level;Stairs;Other    Examination-Participation Restrictions  Other    Stability/Clinical Decision Making  Stable/Uncomplicated    Rehab Potential  Excellent    PT Frequency  3x / week    PT Duration  4 weeks    PT Treatment/Interventions  ADLs/Self Care Home Management;Cryotherapy;Electrical Stimulation;Iontophoresis 4mg /ml Dexamethasone;Moist Heat;Neuromuscular re-education;Manual techniques;Passive range of motion;Therapeutic activities;Therapeutic exercise;Functional mobility training;Stair training;Gait training;Vasopneumatic Device;Taping;Balance training    PT Next Visit Plan  Continue with quad activation training and ROM.    Consulted and Agree with Plan of Care  Patient       Patient will benefit from skilled therapeutic intervention in order to improve the following deficits and impairments:  Pain, Abnormal gait, Decreased activity tolerance, Decreased range of motion, Decreased mobility,  Decreased strength, Increased edema  Visit Diagnosis: Chronic pain of right knee  Stiffness of right knee, not elsewhere classified  Localized edema  Muscle weakness (generalized)     Problem List Patient Active Problem List   Diagnosis Date Noted  . Primary localized osteoarthritis of right knee 10/03/2019  . 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 [place], [place], [address] Phone: [phone]   Fax:  [phone]  Name: Julie Day MRN: [id number] Date of Birth: 1946-02-10

## 2019-11-20 ENCOUNTER — Encounter: Payer: Self-pay | Admitting: Physical Therapy

## 2019-11-20 ENCOUNTER — Other Ambulatory Visit: Payer: Self-pay

## 2019-11-20 ENCOUNTER — Ambulatory Visit: Payer: Medicare HMO | Admitting: Physical Therapy

## 2019-11-20 DIAGNOSIS — G8929 Other chronic pain: Secondary | ICD-10-CM

## 2019-11-20 DIAGNOSIS — M25561 Pain in right knee: Secondary | ICD-10-CM | POA: Diagnosis not present

## 2019-11-20 DIAGNOSIS — R600 Localized edema: Secondary | ICD-10-CM

## 2019-11-20 DIAGNOSIS — M25661 Stiffness of right knee, not elsewhere classified: Secondary | ICD-10-CM

## 2019-11-20 DIAGNOSIS — M6281 Muscle weakness (generalized): Secondary | ICD-10-CM

## 2019-11-20 NOTE — Therapy (Signed)
[facility] [place], [place], [address] Phone: [phone]   Fax:  [phone]  Physical Therapy Treatment  Patient Details  Name: Julie Day MRN: [id number] Date of Birth: 12-16-1945 Referring Provider (PT): Melrose Nakayama MD   Encounter Date: 11/20/2019  PT End of Session - 11/20/19 1127    Visit Number  16    Number of Visits  18    Date for PT Re-Evaluation  01/10/20    Authorization Type  FOTO.    PT Start Time  1116    PT Stop Time  1158    PT Time Calculation (min)  42 min    Activity Tolerance  Patient tolerated treatment well    Behavior During Therapy  WFL for tasks assessed/performed       Past Medical History:  Diagnosis Date  . Allergic rhinitis   . Anginal pain ([place])    Dx as GERD  . Atypical mole 09/23/2017   Left Inner Thigh-Mild  . Atypical mole 09/23/2017   Right Axilla-Mild  . Atypical mole 09/05/2015   Center Abdomen-Severe (clear) (Dr. Nevada Crane)  . Atypical mole 04/14/2019   Right Thigh-Solar Lentigo  . Chronic cystitis   . Degenerative disc disease   . GERD (gastroesophageal reflux disease)   . Hemangioma of liver   . History of hiatal hernia 2017  . Hypertension   . SCC (squamous cell carcinoma) 09/05/2015   Left Dorsal Hand-Well Diff Keratoacanthoma (Dr. Nevada Crane) (free)  . SCC (squamous cell carcinoma) 04/14/2019   Left Outer Shin-Keratoacanthoma (treatment after biopsy)  . SCC (squamous cell carcinoma) 07/21/2019   Right Thigh-Keratoacanthoma (treatment Fluorouracil Cream)  . Squamous cell carcinoma of skin 09/23/2017   Right Upper Shin-Well Diff (Cx3,5FU)    Past Surgical History:  Procedure Laterality Date  . ABDOMINAL HYSTERECTOMY    . CHOLECYSTECTOMY    . COLON RESECTION N/A 05/31/2015   Procedure: LAPAROSCOPIC REPAIR OF HIATAL HERNIA  AND LAPAROSCOPIC NISSEN FUNDOPLICATION;  Surgeon: Fanny Skates, MD;  Location: [facility];  Service: General;  Laterality: N/A;  . ESOPHAGEAL  MANOMETRY N/A 02/27/2015   Procedure: ESOPHAGEAL MANOMETRY (EM);  Surgeon: Garlan Fair, MD;  [facility];  Service: Endoscopy;  Laterality: N/A;  . ESOPHAGOGASTRODUODENOSCOPY (EGD) WITH PROPOFOL N/A 02/26/2015   Procedure: ESOPHAGOGASTRODUODENOSCOPY (EGD) WITH PROPOFOL;  Surgeon: Garlan Fair, MD;  [facility];  Service: Endoscopy;  Laterality: N/A;  . HIATAL HERNIA REPAIR  05/31/2015  . TOTAL KNEE ARTHROPLASTY Right 10/03/2019   Procedure: RIGHT TOTAL KNEE ARTHROPLASTY;  Surgeon: Melrose Nakayama, MD;  [facility];  Service: Orthopedics;  Laterality: Right;    There were no vitals filed for this visit.  Subjective Assessment - 11/20/19 1122    Subjective  COVID 19 screening performed on patient upon arrival. Patient reports more L knee pain and LBP spasms. R knee is reported as doing much better though.    Pertinent History  H/o LBP, HTN, DDD.    How long can you walk comfortably?  Around house with walker.    Patient Stated Goals  Get back to normal.    Currently in Pain?  Yes    Pain Score  2     Pain Location  Knee    Pain Orientation  Right    Pain Descriptors / Indicators  Discomfort    Pain Type  Surgical pain    Pain Onset  More than a month ago    Pain Frequency  Constant  [facility] PT Assessment - 11/20/19 0001      Assessment   Medical Diagnosis  S/p right total knee replacement.    Referring Provider (PT)  Melrose Nakayama MD    Onset Date/Surgical Date  10/03/19    Next MD Visit  12/04/2019      Restrictions   Weight Bearing Restrictions  No                   [facility] Adult PT Treatment/Exercise - 11/20/19 0001      Knee/Hip Exercises: Stretches   Passive Hamstring Stretch  Right;3 reps;30 seconds      Knee/Hip Exercises: Aerobic   Nustep  L5, seat 7-5 x15 min      Knee/Hip Exercises: Standing   Forward Step Up  Right;2 sets;10 reps;Hand Hold: 1;Step Height: 6"    Rocker Board  3 minutes      Modalities    Modalities  Vasopneumatic;Electrical Stimulation      Acupuncturist Location  R knee    Electrical Stimulation Action  IFC    Electrical Stimulation Parameters  80-150 hz x10 min    Electrical Stimulation Goals  Pain;Edema      Vasopneumatic   Number Minutes Vasopneumatic   10 minutes    Vasopnuematic Location   Knee    Vasopneumatic Pressure  Medium    Vasopneumatic Temperature   34      Manual Therapy   Manual Therapy  Passive ROM    Passive ROM  PROM of R knee into extension with holds at end range               PT Short Term Goals - 10/12/19 1211      PT SHORT TERM GOAL #1   Title  STG's=LTG's.        PT Long Term Goals - 11/01/19 1202      PT LONG TERM GOAL #1   Title  Patient will be independent with HEP    Time  6    Period  Weeks    Status  On-going      PT LONG TERM GOAL #2   Title  Active right knee flexion to 115 degrees+ so the patient can perform functional tasks and do so with pain not > 2-3/10.    Time  6    Period  Weeks    Status  Achieved   AROM 95 degrees 10/23/19     PT LONG TERM GOAL #3   Title  Increase right knee strength to a solid 4+/5 to provide good stability for accomplishment of functional activities.    Time  6    Period  Weeks    Status  On-going      PT LONG TERM GOAL #4   Title  Full active knee extension in order to normalize gait.    Time  6    Period  Weeks    Status  Achieved   AROM -2 degrees 10/23/19     PT LONG TERM GOAL #5   Title  Perform a reciprocating stair gait with one railing with pain not > 2-3/10.    Time  6    Period  Weeks    Status  On-going            Plan - 11/20/19 1159    Clinical Impression Statement  Patient presented in clinic with reports of less R knee limitations and pain. More L knee and lumbar  pain reported by patient though. Patient progressed to more functional stair training and assessment as patient's husband would like to take a trip to  their beach house. Patient very cautious with taking a trip due to discomfort from short drives. Minimal R HS tightness noted during supine passive HS stretching. Normal modalities response noted following removal of the modalities.    Personal Factors and Comorbidities  Comorbidity 1;Comorbidity 2    Comorbidities  H/o LBP, HTN, DDD.    Examination-Activity Limitations  Locomotion Level;Stairs;Other    Examination-Participation Restrictions  Other    Stability/Clinical Decision Making  Stable/Uncomplicated    Rehab Potential  Excellent    PT Frequency  3x / week    PT Duration  4 weeks    PT Treatment/Interventions  ADLs/Self Care Home Management;Cryotherapy;Electrical Stimulation;Iontophoresis 4mg /ml Dexamethasone;Moist Heat;Neuromuscular re-education;Manual techniques;Passive range of motion;Therapeutic activities;Therapeutic exercise;Functional mobility training;Stair training;Gait training;Vasopneumatic Device;Taping;Balance training    PT Next Visit Plan  Continue with quad activation training and ROM.    Consulted and Agree with Plan of Care  Patient       Patient will benefit from skilled therapeutic intervention in order to improve the following deficits and impairments:  Pain, Abnormal gait, Decreased activity tolerance, Decreased range of motion, Decreased mobility, Decreased strength, Increased edema  Visit Diagnosis: Chronic pain of right knee  Stiffness of right knee, not elsewhere classified  Localized edema  Muscle weakness (generalized)     Problem List Patient Active Problem List   Diagnosis Date Noted  . Primary localized osteoarthritis of right knee 10/03/2019  . 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 [place], [place], [address] Phone: [phone]   Fax:  [phone]  Name: Julie Day MRN: [id number] Date of  Birth: Nov 21, 1945

## 2019-11-22 ENCOUNTER — Other Ambulatory Visit: Payer: Self-pay

## 2019-11-22 ENCOUNTER — Ambulatory Visit: Payer: Medicare HMO | Admitting: Physical Therapy

## 2019-11-22 ENCOUNTER — Encounter: Payer: Self-pay | Admitting: Physical Therapy

## 2019-11-22 DIAGNOSIS — M25561 Pain in right knee: Secondary | ICD-10-CM

## 2019-11-22 DIAGNOSIS — M6281 Muscle weakness (generalized): Secondary | ICD-10-CM

## 2019-11-22 DIAGNOSIS — G8929 Other chronic pain: Secondary | ICD-10-CM

## 2019-11-22 DIAGNOSIS — M25661 Stiffness of right knee, not elsewhere classified: Secondary | ICD-10-CM

## 2019-11-22 DIAGNOSIS — R600 Localized edema: Secondary | ICD-10-CM

## 2019-11-22 NOTE — Therapy (Signed)
[facility] [place], [place], [address] Phone: [phone]   Fax:  [phone]  Physical Therapy Treatment  Patient Details  Name: Julie Day MRN: [id number] Date of Birth: [birth year] Referring Provider (PT): Melrose Nakayama MD   Encounter Date: 11/22/2019  PT End of Session - 11/22/19 1244    Visit Number  17    Number of Visits  18    Date for PT Re-Evaluation  01/10/20    Authorization Type  FOTO 10th 44%    PT Start Time  1201    PT Stop Time  1243    PT Time Calculation (min)  42 min    Activity Tolerance  Patient tolerated treatment well    Behavior During Therapy  [facility] for tasks assessed/performed       Past Medical History:  Diagnosis Date  . Allergic rhinitis   . Anginal pain ([place])    Dx as GERD  . Atypical mole 09/23/2017   Left Inner Thigh-Mild  . Atypical mole 09/23/2017   Right Axilla-Mild  . Atypical mole 09/05/2015   Center Abdomen-Severe (clear) (Dr. Nevada Crane)  . Atypical mole 04/14/2019   Right Thigh-Solar Lentigo  . Chronic cystitis   . Degenerative disc disease   . GERD (gastroesophageal reflux disease)   . Hemangioma of liver   . History of hiatal hernia 2017  . Hypertension   . SCC (squamous cell carcinoma) 09/05/2015   Left Dorsal Hand-Well Diff Keratoacanthoma (Dr. Nevada Crane) (free)  . SCC (squamous cell carcinoma) 04/14/2019   Left Outer Shin-Keratoacanthoma (treatment after biopsy)  . SCC (squamous cell carcinoma) 07/21/2019   Right Thigh-Keratoacanthoma (treatment Fluorouracil Cream)  . Squamous cell carcinoma of skin 09/23/2017   Right Upper Shin-Well Diff (Cx3,5FU)    Past Surgical History:  Procedure Laterality Date  . ABDOMINAL HYSTERECTOMY    . CHOLECYSTECTOMY    . COLON RESECTION N/A 05/31/2015   Procedure: LAPAROSCOPIC REPAIR OF HIATAL HERNIA  AND LAPAROSCOPIC NISSEN FUNDOPLICATION;  Surgeon: Fanny Skates, MD;  Location: [place];  Service: General;  Laterality: N/A;  .  ESOPHAGEAL MANOMETRY N/A 02/27/2015   Procedure: ESOPHAGEAL MANOMETRY (EM);  Surgeon: Garlan Fair, MD;  [facility];  Service: Endoscopy;  Laterality: N/A;  . ESOPHAGOGASTRODUODENOSCOPY (EGD) WITH PROPOFOL N/A 02/26/2015   Procedure: ESOPHAGOGASTRODUODENOSCOPY (EGD) WITH PROPOFOL;  Surgeon: Garlan Fair, MD;  [facility];  Service: Endoscopy;  Laterality: N/A;  . HIATAL HERNIA REPAIR  05/31/2015  . TOTAL KNEE ARTHROPLASTY Right 10/03/2019   Procedure: RIGHT TOTAL KNEE ARTHROPLASTY;  Surgeon: Melrose Nakayama, MD;  [facility];  Service: Orthopedics;  Laterality: Right;    There were no vitals filed for this visit.  Subjective Assessment - 11/22/19 1201    Subjective  COVID 19 screening performed on patient upon arrival. Patient reports more L knee pain and LBP spasms. R knee is minimal pain today    Pertinent History  H/o LBP, HTN, DDD.    How long can you walk comfortably?  Around house with walker.    Patient Stated Goals  Get back to normal.    Pain Score  1     Pain Location  Knee    Pain Orientation  Right    Pain Descriptors / Indicators  Discomfort    Pain Type  Surgical pain    Pain Onset  More than a month ago    Pain Frequency  Constant    Aggravating Factors   bending knee  Pain Relieving Factors  rest         [facility] PT Assessment - 11/22/19 0001      AROM   AROM Assessment Site  Knee    Right/Left Knee  Right    Right Knee Extension  0    Right Knee Flexion  115      PROM   PROM Assessment Site  Knee    Right/Left Knee  Right    Right Knee Extension  0    Right Knee Flexion  120                   [facility] Adult PT Treatment/Exercise - 11/22/19 0001      Knee/Hip Exercises: Aerobic   Nustep  74mn L5 RT LE only with Bil UE      Knee/Hip Exercises: Standing   Forward Step Up  Right;2 sets;10 reps;Hand Hold: 1;Step Height: 6"    Step Down  Right;10 reps;1 set;Hand Hold: 1      Knee/Hip Exercises: Supine   Bridges  with Clamshell  Strengthening;Both;20 reps   red t-band   Straight Leg Raise with External Rotation  Strengthening;Right;1 set;10 reps      Knee/Hip Exercises: Sidelying   Hip ABduction  Strengthening;Right;20 reps      EAcupuncturistLocation  R knee    Electrical Stimulation Action  IFC    Electrical Stimulation Parameters  80-_0  153m    Electrical Stimulation Goals  Pain;Edema      Vasopneumatic   Number Minutes Vasopneumatic   15 minutes    Vasopnuematic Location   Knee    Vasopneumatic Pressure  Medium    Vasopneumatic Temperature   34 edema      Manual Therapy   Manual Therapy  Passive ROM    Manual therapy comments  omly for measurements             PT Education - 11/22/19 1232    Education Details  HEP progression    Person(s) Educated  Patient    Methods  Explanation;Demonstration;Handout    Comprehension  Verbalized understanding;Returned demonstration       PT Short Term Goals - 10/12/19 1211      PT SHORT TERM GOAL #1   Title  STG's=LTG's.        PT Long Term Goals - 11/22/19 1208      PT LONG TERM GOAL #1   Title  Patient will be independent with HEP    Baseline  MET 11/22/19    Time  6    Period  Weeks    Status  Achieved      PT LONG TERM GOAL #2   Title  Active right knee flexion to 115 degrees+ so the patient can perform functional tasks and do so with pain not > 2-3/10.    Baseline  measured 11/22/19 AROM 115 degrees    Time  6    Period  Weeks    Status  Achieved      PT LONG TERM GOAL #3   Title  Increase right knee strength to a solid 4+/5 to provide good stability for accomplishment of functional activities.    Time  6    Period  Weeks    Status  On-going      PT LONG TERM GOAL #4   Title  Full active knee extension in order to normalize gait.    Baseline  measured 11/22/19 AROM 0 degrees  Time  6    Period  Weeks    Status  Achieved      PT LONG TERM GOAL #5   Title  Perform a  reciprocating stair gait with one railing with pain not > 2-3/10.    Time  6    Period  Weeks    Status  On-going            Plan - 11/22/19 1247    Clinical Impression Statement  Patient tolerated treatment well today. Today progressed patient with exercises to update HEP. Patient has full AROM in right knee. Patient has limitations due to low back and left knee. Patient needs to continue with strengthening to met all goals.    Personal Factors and Comorbidities  Comorbidity 1;Comorbidity 2    Comorbidities  H/o LBP, HTN, DDD.    Examination-Activity Limitations  Locomotion Level;Stairs;Other    Examination-Participation Restrictions  Other    Stability/Clinical Decision Making  Stable/Uncomplicated    Rehab Potential  Excellent    PT Frequency  3x / week    PT Duration  4 weeks    PT Treatment/Interventions  ADLs/Self Care Home Management;Cryotherapy;Electrical Stimulation;Iontophoresis 40m/ml Dexamethasone;Moist Heat;Neuromuscular re-education;Manual techniques;Passive range of motion;Therapeutic activities;Therapeutic exercise;Functional mobility training;Stair training;Gait training;Vasopneumatic Device;Taping;Balance training    PT Next Visit Plan  Continue with quad activation training and strength FOTO next visit    Consulted and Agree with Plan of Care  Patient       Patient will benefit from skilled therapeutic intervention in order to improve the following deficits and impairments:  Pain, Abnormal gait, Decreased activity tolerance, Decreased range of motion, Decreased mobility, Decreased strength, Increased edema  Visit Diagnosis: Chronic pain of right knee  Stiffness of right knee, not elsewhere classified  Localized edema  Muscle weakness (generalized)     Problem List Patient Active Problem List   Diagnosis Date Noted  . Primary localized osteoarthritis of right knee 10/03/2019  . 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

## 2019-11-22 NOTE — Patient Instructions (Signed)
  Strengthening: Hip Abduction (Side-Lying)   Tighten muscles on front of left thigh, then lift leg __5__ inches from surface, keeping knee locked.  Repeat __10__ times per set. Do __2__ sets per session. Do _2___ sessions per day.   Bridging with Theraband  Begin by lying on your back with your knees bent and theraband around your knees. Tighten your abs by tilting your pelvis up and flattening your back on the mat. Squeeze your glutes and raise your hips off of the table towards the ceiling. Keeping your hips raised, pull your knees outward against the band. Relax your knees back to midline and slowly return your hips to the mat. Relax and Breathe.  10-20 reps 1 time daily   STRAIGHT LEG RAISE - SLR EXTERNAL ROTATION  While lying or sitting, raise up your leg with a straight knee and your toes pointed outward.  10 resps 2 sets 1 daily

## 2019-11-27 ENCOUNTER — Other Ambulatory Visit: Payer: Self-pay

## 2019-11-27 ENCOUNTER — Ambulatory Visit: Payer: Medicare HMO | Admitting: Physical Therapy

## 2019-11-27 ENCOUNTER — Encounter: Payer: Self-pay | Admitting: Physical Therapy

## 2019-11-27 DIAGNOSIS — M25561 Pain in right knee: Secondary | ICD-10-CM

## 2019-11-27 DIAGNOSIS — G8929 Other chronic pain: Secondary | ICD-10-CM

## 2019-11-27 DIAGNOSIS — R600 Localized edema: Secondary | ICD-10-CM

## 2019-11-27 DIAGNOSIS — M6281 Muscle weakness (generalized): Secondary | ICD-10-CM

## 2019-11-27 DIAGNOSIS — M25661 Stiffness of right knee, not elsewhere classified: Secondary | ICD-10-CM

## 2019-11-27 NOTE — Therapy (Signed)
[facility] [place], [place], [address] Phone: [phone]   Fax:  [phone]  Physical Therapy Treatment  Patient Details  Name: Julie Day MRN: [id number] Date of Birth: 1946-06-12 Referring Provider (PT): Melrose Nakayama MD   Encounter Date: 11/27/2019  PT End of Session - 11/27/19 1206    Visit Number  18    Number of Visits  18    Date for PT Re-Evaluation  01/10/20    Authorization Type  FOTO 18th 31%    PT Start Time  1200    PT Stop Time  1243    PT Time Calculation (min)  43 min    Activity Tolerance  Patient tolerated treatment well    Behavior During Therapy  [facility] for tasks assessed/performed       Past Medical History:  Diagnosis Date  . Allergic rhinitis   . Anginal pain ([place])    Dx as GERD  . Atypical mole 09/23/2017   Left Inner Thigh-Mild  . Atypical mole 09/23/2017   Right Axilla-Mild  . Atypical mole 09/05/2015   Center Abdomen-Severe (clear) (Dr. Nevada Crane)  . Atypical mole 04/14/2019   Right Thigh-Solar Lentigo  . Chronic cystitis   . Degenerative disc disease   . GERD (gastroesophageal reflux disease)   . Hemangioma of liver   . History of hiatal hernia 2017  . Hypertension   . SCC (squamous cell carcinoma) 09/05/2015   Left Dorsal Hand-Well Diff Keratoacanthoma (Dr. Nevada Crane) (free)  . SCC (squamous cell carcinoma) 04/14/2019   Left Outer Shin-Keratoacanthoma (treatment after biopsy)  . SCC (squamous cell carcinoma) 07/21/2019   Right Thigh-Keratoacanthoma (treatment Fluorouracil Cream)  . Squamous cell carcinoma of skin 09/23/2017   Right Upper Shin-Well Diff (Cx3,5FU)    Past Surgical History:  Procedure Laterality Date  . ABDOMINAL HYSTERECTOMY    . CHOLECYSTECTOMY    . COLON RESECTION N/A 05/31/2015   Procedure: LAPAROSCOPIC REPAIR OF HIATAL HERNIA  AND LAPAROSCOPIC NISSEN FUNDOPLICATION;  Surgeon: Fanny Skates, MD;  Location: [place];  Service: General;  Laterality: N/A;  .  ESOPHAGEAL MANOMETRY N/A 02/27/2015   Procedure: ESOPHAGEAL MANOMETRY (EM);  Surgeon: Garlan Fair, MD;  [facility];  Service: Endoscopy;  Laterality: N/A;  . ESOPHAGOGASTRODUODENOSCOPY (EGD) WITH PROPOFOL N/A 02/26/2015   Procedure: ESOPHAGOGASTRODUODENOSCOPY (EGD) WITH PROPOFOL;  Surgeon: Garlan Fair, MD;  [facility];  Service: Endoscopy;  Laterality: N/A;  . HIATAL HERNIA REPAIR  05/31/2015  . TOTAL KNEE ARTHROPLASTY Right 10/03/2019   Procedure: RIGHT TOTAL KNEE ARTHROPLASTY;  Surgeon: Melrose Nakayama, MD;  [facility];  Service: Orthopedics;  Laterality: Right;    There were no vitals filed for this visit.  Subjective Assessment - 11/27/19 1159    Subjective  COVID 19 screening performed on patient upon arrival. Patient reports ongoing L knee pain and LBP discomfort. R knee is hurting from sitting for two hours straight    Pertinent History  H/o LBP, HTN, DDD.    How long can you walk comfortably?  Around house with walker.    Patient Stated Goals  Get back to normal.    Currently in Pain?  Yes    Pain Score  3     Pain Location  Knee    Pain Orientation  Right    Pain Descriptors / Indicators  Discomfort    Pain Type  Surgical pain    Pain Onset  More than a month ago    Pain Frequency  Intermittent  Aggravating Factors   increased activity or movement    Pain Relieving Factors  rest         [facility] PT Assessment - 11/27/19 0001      ROM / Strength   AROM / PROM / Strength  Strength      AROM   AROM Assessment Site  Knee    Right/Left Knee  Right    Right Knee Extension  0    Right Knee Flexion  115      PROM   PROM Assessment Site  Knee    Right/Left Knee  Right    Right Knee Extension  0    Right Knee Flexion  120      Strength   Strength Assessment Site  Knee;Hip    Right/Left Hip  Right    Right Hip Flexion  4+/5    Right/Left Knee  Right    Right Knee Flexion  4+/5    Right Knee Extension  5/5                    [facility] Adult PT Treatment/Exercise - 11/27/19 0001      Knee/Hip Exercises: Aerobic   Nustep  36mn L5 RT LE only with Bil UE      Knee/Hip Exercises: Seated   Long Arc Quad  Strengthening;Right;10 reps;Weights;3 sets    LIllinois Tool WorksWeight  3 lbs.      Knee/Hip Exercises: Supine   Bridges with Clamshell  Strengthening;Both;20 reps   red t-band   Straight Leg Raise with External Rotation  Strengthening;Right;2 sets;10 reps    Other Supine Knee/Hip Exercises  ball squeeze x30      Electrical Stimulation   Electrical Stimulation Location  R knee    Electrical Stimulation Action  IFC    Electrical Stimulation Parameters  80-'150hz'  x137m    Electrical Stimulation Goals  Pain;Edema      Vasopneumatic   Number Minutes Vasopneumatic   10 minutes    Vasopnuematic Location   Knee    Vasopneumatic Pressure  Medium    Vasopneumatic Temperature   34 edema               PT Short Term Goals - 10/12/19 1211      PT SHORT TERM GOAL #1   Title  STG's=LTG's.        PT Long Term Goals - 11/27/19 1207      PT LONG TERM GOAL #1   Title  Patient will be independent with HEP    Baseline  MET 11/22/19    Time  6    Period  Weeks    Status  Achieved      PT LONG TERM GOAL #2   Title  Active right knee flexion to 115 degrees+ so the patient can perform functional tasks and do so with pain not > 2-3/10.    Baseline  measured 11/22/19 AROM 115 degrees    Time  6    Period  Weeks    Status  Achieved      PT LONG TERM GOAL #3   Title  Increase right knee strength to a solid 4+/5 to provide good stability for accomplishment of functional activities.    Time  6    Period  Weeks    Status  Achieved      PT LONG TERM GOAL #4   Title  Full active knee extension in order to normalize gait.    Baseline  measured 11/22/19 AROM 0 degrees    Time  6    Period  Weeks    Status  Achieved      PT LONG TERM GOAL #5   Title  Perform a reciprocating stair gait  with one railing with pain not > 2-3/10.    Time  6    Period  Weeks    Status  Partially Met   able to perform with right knee, limitations with left 11/27/19           Plan - 11/27/19 1239    Clinical Impression Statement  Patient has met all current goals except limitations with left knee. Patient has improved to full ROM in knee, improved strength and decreased pain. Patient able to perform all ADL's, independent with HEP. Patient limited with left LE and will talk to MD about left knee pain at MD F/U. Patient will be on hold pending MD appt.    Personal Factors and Comorbidities  Comorbidity 1;Comorbidity 2    Comorbidities  H/o LBP, HTN, DDD.    Examination-Activity Limitations  Locomotion Level;Stairs;Other    Examination-Participation Restrictions  Other    Stability/Clinical Decision Making  Stable/Uncomplicated    Rehab Potential  Excellent    PT Frequency  3x / week    PT Duration  4 weeks    PT Treatment/Interventions  ADLs/Self Care Home Management;Cryotherapy;Electrical Stimulation;Iontophoresis 16m/ml Dexamethasone;Moist Heat;Neuromuscular re-education;Manual techniques;Passive range of motion;Therapeutic activities;Therapeutic exercise;Functional mobility training;Stair training;Gait training;Vasopneumatic Device;Taping;Balance training    PT Next Visit Plan  on hold    Consulted and Agree with Plan of Care  Patient       Patient will benefit from skilled therapeutic intervention in order to improve the following deficits and impairments:  Pain, Abnormal gait, Decreased activity tolerance, Decreased range of motion, Decreased mobility, Decreased strength, Increased edema  Visit Diagnosis: Chronic pain of right knee  Stiffness of right knee, not elsewhere classified  Localized edema  Muscle weakness (generalized)     Problem List Patient Active Problem List   Diagnosis Date Noted  . Primary localized osteoarthritis of right knee 10/03/2019  .  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

## 2019-11-29 ENCOUNTER — Encounter: Payer: Medicare HMO | Admitting: Physical Therapy

## 2019-11-29 DIAGNOSIS — M1711 Unilateral primary osteoarthritis, right knee: Secondary | ICD-10-CM | POA: Diagnosis not present

## 2019-11-29 DIAGNOSIS — I10 Essential (primary) hypertension: Secondary | ICD-10-CM | POA: Diagnosis not present

## 2019-11-29 DIAGNOSIS — M8580 Other specified disorders of bone density and structure, unspecified site: Secondary | ICD-10-CM | POA: Diagnosis not present

## 2019-11-29 DIAGNOSIS — E7800 Pure hypercholesterolemia, unspecified: Secondary | ICD-10-CM | POA: Diagnosis not present

## 2019-11-29 DIAGNOSIS — M47812 Spondylosis without myelopathy or radiculopathy, cervical region: Secondary | ICD-10-CM | POA: Diagnosis not present

## 2019-12-04 DIAGNOSIS — M25562 Pain in left knee: Secondary | ICD-10-CM | POA: Diagnosis not present

## 2019-12-04 DIAGNOSIS — Z96651 Presence of right artificial knee joint: Secondary | ICD-10-CM | POA: Diagnosis not present

## 2019-12-04 DIAGNOSIS — M1712 Unilateral primary osteoarthritis, left knee: Secondary | ICD-10-CM | POA: Diagnosis not present

## 2019-12-05 DIAGNOSIS — N3020 Other chronic cystitis without hematuria: Secondary | ICD-10-CM | POA: Diagnosis not present

## 2019-12-14 DIAGNOSIS — Z0000 Encounter for general adult medical examination without abnormal findings: Secondary | ICD-10-CM | POA: Diagnosis not present

## 2019-12-14 DIAGNOSIS — Z1389 Encounter for screening for other disorder: Secondary | ICD-10-CM | POA: Diagnosis not present

## 2019-12-14 DIAGNOSIS — I10 Essential (primary) hypertension: Secondary | ICD-10-CM | POA: Diagnosis not present

## 2019-12-14 DIAGNOSIS — M4696 Unspecified inflammatory spondylopathy, lumbar region: Secondary | ICD-10-CM | POA: Diagnosis not present

## 2019-12-14 DIAGNOSIS — M1711 Unilateral primary osteoarthritis, right knee: Secondary | ICD-10-CM | POA: Diagnosis not present

## 2019-12-14 DIAGNOSIS — E7800 Pure hypercholesterolemia, unspecified: Secondary | ICD-10-CM | POA: Diagnosis not present

## 2019-12-14 DIAGNOSIS — R300 Dysuria: Secondary | ICD-10-CM | POA: Diagnosis not present

## 2019-12-14 DIAGNOSIS — M353 Polymyalgia rheumatica: Secondary | ICD-10-CM | POA: Diagnosis not present

## 2019-12-20 DIAGNOSIS — R300 Dysuria: Secondary | ICD-10-CM | POA: Diagnosis not present

## 2019-12-20 DIAGNOSIS — N3020 Other chronic cystitis without hematuria: Secondary | ICD-10-CM | POA: Diagnosis not present

## 2019-12-20 DIAGNOSIS — R102 Pelvic and perineal pain: Secondary | ICD-10-CM | POA: Diagnosis not present

## 2020-01-16 DIAGNOSIS — Z96651 Presence of right artificial knee joint: Secondary | ICD-10-CM | POA: Diagnosis not present

## 2020-01-16 DIAGNOSIS — M25561 Pain in right knee: Secondary | ICD-10-CM | POA: Diagnosis not present

## 2020-01-23 DIAGNOSIS — N3020 Other chronic cystitis without hematuria: Secondary | ICD-10-CM | POA: Diagnosis not present

## 2020-01-23 DIAGNOSIS — T83711A Erosion of implanted vaginal mesh and other prosthetic materials to surrounding organ or tissue, initial encounter: Secondary | ICD-10-CM | POA: Diagnosis not present

## 2020-01-23 DIAGNOSIS — R3121 Asymptomatic microscopic hematuria: Secondary | ICD-10-CM | POA: Diagnosis not present

## 2020-01-23 DIAGNOSIS — R102 Pelvic and perineal pain: Secondary | ICD-10-CM | POA: Diagnosis not present

## 2020-01-30 DIAGNOSIS — R69 Illness, unspecified: Secondary | ICD-10-CM | POA: Diagnosis not present

## 2020-02-12 DIAGNOSIS — R3129 Other microscopic hematuria: Secondary | ICD-10-CM | POA: Diagnosis not present

## 2020-02-12 DIAGNOSIS — R3121 Asymptomatic microscopic hematuria: Secondary | ICD-10-CM | POA: Diagnosis not present

## 2020-02-12 DIAGNOSIS — K7689 Other specified diseases of liver: Secondary | ICD-10-CM | POA: Diagnosis not present

## 2020-02-12 DIAGNOSIS — I700 Atherosclerosis of aorta: Secondary | ICD-10-CM | POA: Diagnosis not present

## 2020-02-12 DIAGNOSIS — M47817 Spondylosis without myelopathy or radiculopathy, lumbosacral region: Secondary | ICD-10-CM | POA: Diagnosis not present

## 2020-03-14 DIAGNOSIS — N3020 Other chronic cystitis without hematuria: Secondary | ICD-10-CM | POA: Diagnosis not present

## 2020-03-14 DIAGNOSIS — R109 Unspecified abdominal pain: Secondary | ICD-10-CM | POA: Diagnosis not present

## 2020-04-02 DIAGNOSIS — M1711 Unilateral primary osteoarthritis, right knee: Secondary | ICD-10-CM | POA: Diagnosis not present

## 2020-04-02 DIAGNOSIS — M8580 Other specified disorders of bone density and structure, unspecified site: Secondary | ICD-10-CM | POA: Diagnosis not present

## 2020-04-02 DIAGNOSIS — E7800 Pure hypercholesterolemia, unspecified: Secondary | ICD-10-CM | POA: Diagnosis not present

## 2020-04-02 DIAGNOSIS — M47812 Spondylosis without myelopathy or radiculopathy, cervical region: Secondary | ICD-10-CM | POA: Diagnosis not present

## 2020-04-02 DIAGNOSIS — I10 Essential (primary) hypertension: Secondary | ICD-10-CM | POA: Diagnosis not present

## 2020-04-10 DIAGNOSIS — H25813 Combined forms of age-related cataract, bilateral: Secondary | ICD-10-CM | POA: Diagnosis not present

## 2020-04-10 DIAGNOSIS — H35033 Hypertensive retinopathy, bilateral: Secondary | ICD-10-CM | POA: Diagnosis not present

## 2020-04-10 DIAGNOSIS — G43B0 Ophthalmoplegic migraine, not intractable: Secondary | ICD-10-CM | POA: Diagnosis not present

## 2020-04-10 DIAGNOSIS — D3132 Benign neoplasm of left choroid: Secondary | ICD-10-CM | POA: Diagnosis not present

## 2020-04-22 DIAGNOSIS — Z96651 Presence of right artificial knee joint: Secondary | ICD-10-CM | POA: Diagnosis not present

## 2020-04-22 DIAGNOSIS — Z471 Aftercare following joint replacement surgery: Secondary | ICD-10-CM | POA: Diagnosis not present

## 2020-05-09 DIAGNOSIS — R350 Frequency of micturition: Secondary | ICD-10-CM | POA: Diagnosis not present

## 2020-05-09 DIAGNOSIS — N3020 Other chronic cystitis without hematuria: Secondary | ICD-10-CM | POA: Diagnosis not present

## 2020-05-09 DIAGNOSIS — N3946 Mixed incontinence: Secondary | ICD-10-CM | POA: Diagnosis not present

## 2020-05-16 DIAGNOSIS — R102 Pelvic and perineal pain: Secondary | ICD-10-CM | POA: Diagnosis not present

## 2020-05-20 DIAGNOSIS — R69 Illness, unspecified: Secondary | ICD-10-CM | POA: Diagnosis not present

## 2020-05-22 DIAGNOSIS — R102 Pelvic and perineal pain: Secondary | ICD-10-CM | POA: Diagnosis not present

## 2020-06-03 DIAGNOSIS — R69 Illness, unspecified: Secondary | ICD-10-CM | POA: Diagnosis not present

## 2020-06-04 DIAGNOSIS — R350 Frequency of micturition: Secondary | ICD-10-CM | POA: Diagnosis not present

## 2020-06-04 DIAGNOSIS — N3000 Acute cystitis without hematuria: Secondary | ICD-10-CM | POA: Diagnosis not present

## 2020-06-04 DIAGNOSIS — I10 Essential (primary) hypertension: Secondary | ICD-10-CM | POA: Diagnosis not present

## 2020-06-04 DIAGNOSIS — R300 Dysuria: Secondary | ICD-10-CM | POA: Diagnosis not present

## 2020-06-19 DIAGNOSIS — M7061 Trochanteric bursitis, right hip: Secondary | ICD-10-CM | POA: Diagnosis not present

## 2020-06-19 DIAGNOSIS — Y939 Activity, unspecified: Secondary | ICD-10-CM | POA: Diagnosis not present

## 2020-06-19 DIAGNOSIS — M7062 Trochanteric bursitis, left hip: Secondary | ICD-10-CM | POA: Diagnosis not present

## 2020-06-19 DIAGNOSIS — M353 Polymyalgia rheumatica: Secondary | ICD-10-CM | POA: Diagnosis not present

## 2020-07-01 DIAGNOSIS — N3020 Other chronic cystitis without hematuria: Secondary | ICD-10-CM | POA: Diagnosis not present

## 2020-07-01 DIAGNOSIS — I10 Essential (primary) hypertension: Secondary | ICD-10-CM | POA: Diagnosis not present

## 2020-07-01 DIAGNOSIS — M2550 Pain in unspecified joint: Secondary | ICD-10-CM | POA: Diagnosis not present

## 2020-07-03 ENCOUNTER — Ambulatory Visit: Payer: Medicare HMO | Attending: Rheumatology | Admitting: Physical Therapy

## 2020-07-03 ENCOUNTER — Other Ambulatory Visit: Payer: Self-pay

## 2020-07-03 DIAGNOSIS — M25551 Pain in right hip: Secondary | ICD-10-CM | POA: Diagnosis not present

## 2020-07-03 DIAGNOSIS — M25552 Pain in left hip: Secondary | ICD-10-CM

## 2020-07-03 NOTE — Therapy (Signed)
[facility] [place], [place], [address] Phone: [phone]   Fax:  [phone]  Physical Therapy Evaluation  Patient Details  Name: Julie Day MRN: [id number] Date of Birth: 12-12-1945 Referring Provider (PT): Lanice Schwab DO.   Encounter Date: 07/03/2020   PT End of Session - 07/03/20 1242    Visit Number 1    Number of Visits 8    Date for PT Re-Evaluation 07/31/20    PT Start Time 1030    PT Stop Time 1125    PT Time Calculation (min) 55 min    Activity Tolerance Patient tolerated treatment well    Behavior During Therapy WFL for tasks assessed/performed           Past Medical History:  Diagnosis Date  . Allergic rhinitis   . Anginal pain ([place])    Dx as GERD  . Atypical mole 09/23/2017   Left Inner Thigh-Mild  . Atypical mole 09/23/2017   Right Axilla-Mild  . Atypical mole 09/05/2015   Center Abdomen-Severe (clear) (Dr. Nevada Crane)  . Atypical mole 04/14/2019   Right Thigh-Solar Lentigo  . Chronic cystitis   . Degenerative disc disease   . GERD (gastroesophageal reflux disease)   . Hemangioma of liver   . History of hiatal hernia 2017  . Hypertension   . SCC (squamous cell carcinoma) 09/05/2015   Left Dorsal Hand-Well Diff Keratoacanthoma (Dr. Nevada Crane) (free)  . SCC (squamous cell carcinoma) 04/14/2019   Left Outer Shin-Keratoacanthoma (treatment after biopsy)  . SCC (squamous cell carcinoma) 07/21/2019   Right Thigh-Keratoacanthoma (treatment Fluorouracil Cream)  . Squamous cell carcinoma of skin 09/23/2017   Right Upper Shin-Well Diff (Cx3,5FU)    Past Surgical History:  Procedure Laterality Date  . ABDOMINAL HYSTERECTOMY    . CHOLECYSTECTOMY    . COLON RESECTION N/A 05/31/2015   Procedure: LAPAROSCOPIC REPAIR OF HIATAL HERNIA  AND LAPAROSCOPIC NISSEN FUNDOPLICATION;  Surgeon: Fanny Skates, MD;  Location: [facility];  Service: General;  Laterality: N/A;  . ESOPHAGEAL MANOMETRY N/A 02/27/2015    Procedure: ESOPHAGEAL MANOMETRY (EM);  Surgeon: Garlan Fair, MD;  [facility];  Service: Endoscopy;  Laterality: N/A;  . ESOPHAGOGASTRODUODENOSCOPY (EGD) WITH PROPOFOL N/A 02/26/2015   Procedure: ESOPHAGOGASTRODUODENOSCOPY (EGD) WITH PROPOFOL;  Surgeon: Garlan Fair, MD;  [facility];  Service: Endoscopy;  Laterality: N/A;  . HIATAL HERNIA REPAIR  05/31/2015  . TOTAL KNEE ARTHROPLASTY Right 10/03/2019   Procedure: RIGHT TOTAL KNEE ARTHROPLASTY;  Surgeon: Melrose Nakayama, MD;  [facility];  Service: Orthopedics;  Laterality: Right;    There were no vitals filed for this visit.    Subjective Assessment - 07/03/20 1208    Subjective COVID-19 screen performed prior to patient entering clinic. The patient presenst to the clinic today with c/o bilateral hip pain.  The pain wakes her at night.  She states this has been worsening over the last year and a half.  Moving decreases her pain and lying down at night increases her pain.    Pertinent History H/o LBP, HTN, DDD, right total knee replacement.    Patient Stated Goals Sleep undisturbed.              [facility] PT Assessment - 07/03/20 0001      Assessment   Medical Diagnosis Bilateral trochanteric bursitis.    Referring Provider (PT) Lanice Schwab DO.    Onset Date/Surgical Date --   ~1.5 year ago.     Precautions   Precautions None  Restrictions   Weight Bearing Restrictions No      Balance Screen   Has the patient fallen in the past 6 months No    Has the patient had a decrease in activity level because of a fear of falling?  No    Is the patient reluctant to leave their home because of a fear of falling?  No      Home Environment   Living Environment Private residence      Prior Function   Level of Independence Independent      AROM   Overall AROM Comments Normal bilateral hip range of motion.      Strength   Overall Strength Comments Essentially normal bilateral hip strength.       Palpation   Palpation comment Tender to palption over and around both greater trochanters as well as increase tone and tenderness over bilateral TFL's and glut medius.      Special Tests   Other special tests Equal leg lengths.      Ambulation/Gait   Gait Comments Slow and purposeful gait pattern.                      Objective measurements completed on examination: See above findings.       [facility] Adult PT Treatment/Exercise - 07/03/20 0001      Modalities   Modalities Electrical Stimulation      Electrical Stimulation   Electrical Stimulation Location Bil hips.    Electrical Stimulation Action Pre-mod.    Electrical Stimulation Parameters 80-150 Hz x 20 minutes.    Electrical Stimulation Goals Pain                    PT Short Term Goals - 10/12/19 1211      PT SHORT TERM GOAL #1   Title STG's=LTG's.             PT Long Term Goals - 07/03/20 1328      PT LONG TERM GOAL #1   Title Patient will be independent with HEP    Time 4    Period Weeks    Status New      PT LONG TERM GOAL #2   Title Sleep 6 hours undisturbed.    Time 6    Period Weeks    Status New      PT LONG TERM GOAL #3   Title Perform ADL's with a bilateral hip pain-level not > 2-3/10.    Time 6    Period Weeks    Status New                  Plan - 07/03/20 1255    Clinical Impression Statement The patient presents OPPT with c/o bilateral hip pain that has been ongoing and worseneing over the last year and a half.  Her pain gets very bad at night and wakes her up.  She was found to very very tender to palpation over and around both greater trochanters as well as her lateral hip musculature.  She reports she has also began to experience pain into the region of both ITB's.  Her bilateral hip range of motion and strength is essentially normal.  Patient will benefit from skilled physical therapy intervention to address deficits and pain.    Personal Factors and  Comorbidities Comorbidity 1;Comorbidity 2    Comorbidities H/o LBP, HTN, DDD, right total knee replacement.    Examination-Activity Limitations Other;Sleep  Examination-Participation Restrictions Other    Stability/Clinical Decision Making Evolving/Moderate complexity    Clinical Decision Making Low    Rehab Potential Good    PT Frequency 2x / week    PT Duration 4 weeks    PT Treatment/Interventions ADLs/Self Care Home Management;Cryotherapy;Electrical Stimulation;Iontophoresis 4mg /ml Dexamethasone;Moist Heat;Neuromuscular re-education;Manual techniques;Passive range of motion;Therapeutic activities;Therapeutic exercise;Functional mobility training;Stair training;Gait training;Vasopneumatic Device;Taping;Balance training;Dry needling;Ultrasound;Joint Manipulations    PT Next Visit Plan Combo and STW/M to both hips, dry needling, lateral hip and ITB stretching.  SDLY hip abduction.    Consulted and Agree with Plan of Care Patient           Patient will benefit from skilled therapeutic intervention in order to improve the following deficits and impairments:  Pain, Decreased activity tolerance  Visit Diagnosis: Pain in right hip - Plan: PT plan of care cert/re-cert  Pain in left hip - Plan: PT plan of care cert/re-cert     Problem List Patient Active Problem List   Diagnosis Date Noted  . Primary localized osteoarthritis of right knee 10/03/2019  . 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 [place], [place], [address] Phone: [phone]   Fax:  [phone]  Name: Julie Day MRN: [id number] Date of Birth: [birth year]

## 2020-07-03 NOTE — Therapy (Deleted)
[facility] [place], [place], [address] Phone: [phone]   Fax:  [phone]  Physical Therapy Treatment  Patient Details  Name: Julie Day MRN: [id number] Date of Birth: 10/20/1945 Referring Provider (PT): Lanice Schwab DO.   Encounter Date: 07/03/2020   PT End of Session - 07/03/20 1242    Visit Number 1    Number of Visits 8    Date for PT Re-Evaluation 07/31/20    PT Start Time 1030    PT Stop Time 1125    PT Time Calculation (min) 55 min    Activity Tolerance Patient tolerated treatment well    Behavior During Therapy WFL for tasks assessed/performed           Past Medical History:  Diagnosis Date  . Allergic rhinitis   . Anginal pain ([place])    Dx as GERD  . Atypical mole 09/23/2017   Left Inner Thigh-Mild  . Atypical mole 09/23/2017   Right Axilla-Mild  . Atypical mole 09/05/2015   Center Abdomen-Severe (clear) (Dr. Nevada Crane)  . Atypical mole 04/14/2019   Right Thigh-Solar Lentigo  . Chronic cystitis   . Degenerative disc disease   . GERD (gastroesophageal reflux disease)   . Hemangioma of liver   . History of hiatal hernia 2017  . Hypertension   . SCC (squamous cell carcinoma) 09/05/2015   Left Dorsal Hand-Well Diff Keratoacanthoma (Dr. Nevada Crane) (free)  . SCC (squamous cell carcinoma) 04/14/2019   Left Outer Shin-Keratoacanthoma (treatment after biopsy)  . SCC (squamous cell carcinoma) 07/21/2019   Right Thigh-Keratoacanthoma (treatment Fluorouracil Cream)  . Squamous cell carcinoma of skin 09/23/2017   Right Upper Shin-Well Diff (Cx3,5FU)    Past Surgical History:  Procedure Laterality Date  . ABDOMINAL HYSTERECTOMY    . CHOLECYSTECTOMY    . COLON RESECTION N/A 05/31/2015   Procedure: LAPAROSCOPIC REPAIR OF HIATAL HERNIA  AND LAPAROSCOPIC NISSEN FUNDOPLICATION;  Surgeon: Fanny Skates, MD;  Location: [facility];  Service: General;  Laterality: N/A;  . ESOPHAGEAL MANOMETRY N/A 02/27/2015    Procedure: ESOPHAGEAL MANOMETRY (EM);  Surgeon: Garlan Fair, MD;  [facility];  Service: Endoscopy;  Laterality: N/A;  . ESOPHAGOGASTRODUODENOSCOPY (EGD) WITH PROPOFOL N/A 02/26/2015   Procedure: ESOPHAGOGASTRODUODENOSCOPY (EGD) WITH PROPOFOL;  Surgeon: Garlan Fair, MD;  [facility];  Service: Endoscopy;  Laterality: N/A;  . HIATAL HERNIA REPAIR  05/31/2015  . TOTAL KNEE ARTHROPLASTY Right 10/03/2019   Procedure: RIGHT TOTAL KNEE ARTHROPLASTY;  Surgeon: Melrose Nakayama, MD;  [facility]: [facility];  Service: Orthopedics;  Laterality: Right;    There were no vitals filed for this visit.   Subjective Assessment - 07/03/20 1208    Subjective COVID-19 screen performed prior to patient entering clinic. The patient presenst to the clinic today with c/o bilateral hip pain.  The pain wakes her at night.  She states this has been worsening over the last year and a half.  Moving decreases her pain and lying down at night increases her pain.    Pertinent History H/o LBP, HTN, DDD, right total knee replacement.    Patient Stated Goals Sleep undisturbed.              [facility] PT Assessment - 07/03/20 0001      Assessment   Medical Diagnosis Bilateral trochanteric bursitis.    Referring Provider (PT) Lanice Schwab DO.    Onset Date/Surgical Date --   ~1.5 year ago.     Precautions   Precautions None  Restrictions   Weight Bearing Restrictions No      Balance Screen   Has the patient fallen in the past 6 months No    Has the patient had a decrease in activity level because of a fear of falling?  No    Is the patient reluctant to leave their home because of a fear of falling?  No      Home Environment   Living Environment Private residence      Prior Function   Level of Independence Independent      AROM   Overall AROM Comments Normal bilateral hip range of motion.      Strength   Overall Strength Comments Essentially normal bilateral hip strength.       Palpation   Palpation comment Tender to palption over and around both greater trochanters as well as increase tone and tenderness over bilateral TFL's and glut medius.      Special Tests   Other special tests Equal leg lengths.      Ambulation/Gait   Gait Comments Slow and purposeful gait pattern.                         [facility] Adult PT Treatment/Exercise - 07/03/20 0001      Modalities   Modalities Electrical Stimulation      Electrical Stimulation   Electrical Stimulation Location Bil hips.    Electrical Stimulation Action Pre-mod.    Electrical Stimulation Parameters 80-150 Hz x 20 minutes.    Electrical Stimulation Goals Pain                    PT Short Term Goals - 10/12/19 1211      PT SHORT TERM GOAL #1   Title STG's=LTG's.             PT Long Term Goals - 07/03/20 1328      PT LONG TERM GOAL #1   Title Patient will be independent with HEP    Time 4    Period Weeks    Status New      PT LONG TERM GOAL #2   Title Sleep 6 hours undisturbed.    Time 6    Period Weeks    Status New      PT LONG TERM GOAL #3   Title Perform ADL's with a bilateral hip pain-level not > 2-3/10.    Time 6    Period Weeks    Status New                 Plan - 07/03/20 1255    Clinical Impression Statement The patient presents OPPT with c/o bilateral hip pain that has been ongoing and worseneing over the last year and a half.  Her pain gets very bad at night and wakes her up.  She was found to very very tender to palpation over and around both greater trochanters as well as her lateral hip musculature.  She reports she has also began to experience pain into the region of both ITB's.  Her bilateral hip range of motion and strength is essentially normal.  Patient will benefit from skilled physical therapy intervention to address deficits and pain.    Personal Factors and Comorbidities Comorbidity 1;Comorbidity 2    Comorbidities H/o LBP, HTN, DDD,  right total knee replacement.    Examination-Activity Limitations Other;Sleep    Examination-Participation Restrictions Other    Stability/Clinical Decision Making Evolving/Moderate complexity  Clinical Decision Making Low    Rehab Potential Good    PT Frequency 2x / week    PT Duration 4 weeks    PT Treatment/Interventions ADLs/Self Care Home Management;Cryotherapy;Electrical Stimulation;Iontophoresis 4mg /ml Dexamethasone;Moist Heat;Neuromuscular re-education;Manual techniques;Passive range of motion;Therapeutic activities;Therapeutic exercise;Functional mobility training;Stair training;Gait training;Vasopneumatic Device;Taping;Balance training;Dry needling;Ultrasound;Joint Manipulations    PT Next Visit Plan Combo and STW/M to both hips, dry needling, lateral hip and ITB stretching.  SDLY hip abduction.    Consulted and Agree with Plan of Care Patient           Patient will benefit from skilled therapeutic intervention in order to improve the following deficits and impairments:  Pain, Decreased activity tolerance  Visit Diagnosis: Pain in right hip - Plan: PT plan of care cert/re-cert  Pain in left hip - Plan: PT plan of care cert/re-cert     Problem List Patient Active Problem List   Diagnosis Date Noted  . Primary localized osteoarthritis of right knee 10/03/2019  . 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 [place], [place], [address] Phone: [phone]   Fax:  [phone]  Name: Kahmari Herard MRN: [id number] Date of Birth: 29-Jul-1946

## 2020-07-08 ENCOUNTER — Other Ambulatory Visit: Payer: Self-pay

## 2020-07-08 ENCOUNTER — Ambulatory Visit: Payer: Medicare HMO | Admitting: Physical Therapy

## 2020-07-08 DIAGNOSIS — M25552 Pain in left hip: Secondary | ICD-10-CM | POA: Diagnosis not present

## 2020-07-08 DIAGNOSIS — M25551 Pain in right hip: Secondary | ICD-10-CM | POA: Diagnosis not present

## 2020-07-08 NOTE — Therapy (Signed)
[facility] [place], [place], [address] Phone: [phone]   Fax:  [phone]  Physical Therapy Treatment  Patient Details  Name: Julie Day MRN: [id number] Date of Birth: Sep 27, 1945 Referring Provider (PT): Lanice Schwab DO.   Encounter Date: 07/08/2020   PT End of Session - 07/08/20 1452    Visit Number 2    Number of Visits 8    Date for PT Re-Evaluation 07/31/20    Authorization Type FOTO 18th 31%    PT Start Time 0145    PT Stop Time 0242    PT Time Calculation (min) 57 min           Past Medical History:  Diagnosis Date  . Allergic rhinitis   . Anginal pain ([place])    Dx as GERD  . Atypical mole 09/23/2017   Left Inner Thigh-Mild  . Atypical mole 09/23/2017   Right Axilla-Mild  . Atypical mole 09/05/2015   Center Abdomen-Severe (clear) (Dr. Nevada Crane)  . Atypical mole 04/14/2019   Right Thigh-Solar Lentigo  . Chronic cystitis   . Degenerative disc disease   . GERD (gastroesophageal reflux disease)   . Hemangioma of liver   . History of hiatal hernia 2017  . Hypertension   . SCC (squamous cell carcinoma) 09/05/2015   Left Dorsal Hand-Well Diff Keratoacanthoma (Dr. Nevada Crane) (free)  . SCC (squamous cell carcinoma) 04/14/2019   Left Outer Shin-Keratoacanthoma (treatment after biopsy)  . SCC (squamous cell carcinoma) 07/21/2019   Right Thigh-Keratoacanthoma (treatment Fluorouracil Cream)  . Squamous cell carcinoma of skin 09/23/2017   Right Upper Shin-Well Diff (Cx3,5FU)    Past Surgical History:  Procedure Laterality Date  . ABDOMINAL HYSTERECTOMY    . CHOLECYSTECTOMY    . COLON RESECTION N/A 05/31/2015   Procedure: LAPAROSCOPIC REPAIR OF HIATAL HERNIA  AND LAPAROSCOPIC NISSEN FUNDOPLICATION;  Surgeon: Fanny Skates, MD;  Location: [facility];  Service: General;  Laterality: N/A;  . ESOPHAGEAL MANOMETRY N/A 02/27/2015   Procedure: ESOPHAGEAL MANOMETRY (EM);  Surgeon: Garlan Fair, MD;  [facility];  Service: Endoscopy;  Laterality: N/A;  . ESOPHAGOGASTRODUODENOSCOPY (EGD) WITH PROPOFOL N/A 02/26/2015   Procedure: ESOPHAGOGASTRODUODENOSCOPY (EGD) WITH PROPOFOL;  Surgeon: Garlan Fair, MD;  [facility];  Service: Endoscopy;  Laterality: N/A;  . HIATAL HERNIA REPAIR  05/31/2015  . TOTAL KNEE ARTHROPLASTY Right 10/03/2019   Procedure: RIGHT TOTAL KNEE ARTHROPLASTY;  Surgeon: Melrose Nakayama, MD;  [facility];  Service: Orthopedics;  Laterality: Right;    There were no vitals filed for this visit.   Subjective Assessment - 07/08/20 1442    Subjective COVID-19 screen performed prior to patient entering clinic.  Patient reporting already much better after first treatment.    Pertinent History H/o LBP, HTN, DDD, right total knee replacement.    Currently in Pain? Yes    Pain Score 1     Pain Orientation Right;Left    Pain Descriptors / Indicators Dull    Pain Type Chronic pain    Pain Onset More than a month ago                             [facility] Adult PT Treatment/Exercise - 07/08/20 0001      Modalities   Modalities Electrical Stimulation;Traction;Ultrasound      Electrical Stimulation   Electrical Stimulation Location Bilateral lateral hips.    Electrical Stimulation Action Pre-mod.    Electrical Stimulation Parameters 80-150  Hz x 20 minutes.    Electrical Stimulation Goals Pain      Ultrasound   Ultrasound Location Right lateral hip.    Ultrasound Parameters Combo e'stim/US at 1.50 W/CM2 x 12 minutes.    Ultrasound Goals Pain      Manual Therapy   Manual Therapy Soft tissue mobilization    Soft tissue mobilization STW/M x 11 minutes to patient's lateral hip musculature with ischemic release technique over right TFL.                    PT Short Term Goals - 10/12/19 1211      PT SHORT TERM GOAL #1   Title STG's=LTG's.             PT Long Term Goals - 07/03/20 1328      PT LONG TERM GOAL #1   Title Patient  will be independent with HEP    Time 4    Period Weeks    Status New      PT LONG TERM GOAL #2   Title Sleep 6 hours undisturbed.    Time 6    Period Weeks    Status New      PT LONG TERM GOAL #3   Title Perform ADL's with a bilateral hip pain-level not > 2-3/10.    Time 6    Period Weeks    Status New                 Plan - 07/08/20 1506    Clinical Impression Statement Patient already reporting a significant reduction in bilateral hip pain.  She was tender to palpation over her right TFL but responded very well to treatment today.    Personal Factors and Comorbidities Comorbidity 1;Comorbidity 2    Comorbidities H/o LBP, HTN, DDD, right total knee replacement.    Examination-Activity Limitations Other;Sleep    Examination-Participation Restrictions Other    Stability/Clinical Decision Making Evolving/Moderate complexity    Rehab Potential Good    PT Frequency 2x / week    PT Duration 4 weeks    PT Treatment/Interventions ADLs/Self Care Home Management;Cryotherapy;Electrical Stimulation;Iontophoresis 4mg /ml Dexamethasone;Moist Heat;Neuromuscular re-education;Manual techniques;Passive range of motion;Therapeutic activities;Therapeutic exercise;Functional mobility training;Stair training;Gait training;Vasopneumatic Device;Taping;Balance training;Dry needling;Ultrasound;Joint Manipulations    Consulted and Agree with Plan of Care Patient           Patient will benefit from skilled therapeutic intervention in order to improve the following deficits and impairments:  Pain, Decreased activity tolerance  Visit Diagnosis: Pain in right hip  Pain in left hip     Problem List Patient Active Problem List   Diagnosis Date Noted  . Primary localized osteoarthritis of right knee 10/03/2019  . 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 [place], [place], [address] Phone: [phone]   Fax:  [phone]  Name: Julie Day MRN: [id number] Date of Birth: [birth year]

## 2020-07-11 DIAGNOSIS — R69 Illness, unspecified: Secondary | ICD-10-CM | POA: Diagnosis not present

## 2020-07-15 ENCOUNTER — Ambulatory Visit: Payer: Medicare HMO | Admitting: Physical Therapy

## 2020-07-15 ENCOUNTER — Other Ambulatory Visit: Payer: Self-pay

## 2020-07-15 DIAGNOSIS — M25551 Pain in right hip: Secondary | ICD-10-CM

## 2020-07-15 DIAGNOSIS — R300 Dysuria: Secondary | ICD-10-CM | POA: Diagnosis not present

## 2020-07-15 DIAGNOSIS — N3000 Acute cystitis without hematuria: Secondary | ICD-10-CM | POA: Diagnosis not present

## 2020-07-15 DIAGNOSIS — M25552 Pain in left hip: Secondary | ICD-10-CM | POA: Diagnosis not present

## 2020-07-15 DIAGNOSIS — R350 Frequency of micturition: Secondary | ICD-10-CM | POA: Diagnosis not present

## 2020-07-15 NOTE — Therapy (Addendum)
[facility] [place], [place], [address] Phone: [phone]   Fax:  [phone]  Physical Therapy Treatment  Patient Details  Name: Yenni Carra MRN: [id number] Date of Birth: [birth year] Referring Provider (PT): Lanice Schwab DO.   Encounter Date: 07/15/2020   PT End of Session - 07/15/20 1426     Visit Number 3    Number of Visits 8    Date for PT Re-Evaluation 07/31/20    PT Start Time 0146    PT Stop Time 0240    PT Time Calculation (min) 54 min    Activity Tolerance Patient tolerated treatment well    Behavior During Therapy [facility] for tasks assessed/performed             Past Medical History:  Diagnosis Date   Allergic rhinitis    Anginal pain ([place])    Dx as GERD   Atypical mole 09/23/2017   Left Inner Thigh-Mild   Atypical mole 09/23/2017   Right Axilla-Mild   Atypical mole 09/05/2015   Center Abdomen-Severe (clear) (Dr. Nevada Crane)   Atypical mole 04/14/2019   Right Thigh-Solar Lentigo   Chronic cystitis    Degenerative disc disease    GERD (gastroesophageal reflux disease)    Hemangioma of liver    History of hiatal hernia 2017   Hypertension    SCC (squamous cell carcinoma) 09/05/2015   Left Dorsal Hand-Well Diff Keratoacanthoma (Dr. Nevada Crane) (free)   SCC (squamous cell carcinoma) 04/14/2019   Left Outer Shin-Keratoacanthoma (treatment after biopsy)   SCC (squamous cell carcinoma) 07/21/2019   Right Thigh-Keratoacanthoma (treatment Fluorouracil Cream)   Squamous cell carcinoma of skin 09/23/2017   Right Upper Shin-Well Diff (Cx3,5FU)    Past Surgical History:  Procedure Laterality Date   ABDOMINAL HYSTERECTOMY     CHOLECYSTECTOMY     COLON RESECTION N/A 05/31/2015   Procedure: LAPAROSCOPIC REPAIR OF HIATAL HERNIA  AND LAPAROSCOPIC NISSEN FUNDOPLICATION;  Surgeon: Fanny Skates, MD;  Location: [facility];  Service: General;  Laterality: N/A;   ESOPHAGEAL MANOMETRY N/A 02/27/2015   Procedure:  ESOPHAGEAL MANOMETRY (EM);  Surgeon: Garlan Fair, MD;  [facility];  Service: Endoscopy;  Laterality: N/A;   ESOPHAGOGASTRODUODENOSCOPY (EGD) WITH PROPOFOL N/A 02/26/2015   Procedure: ESOPHAGOGASTRODUODENOSCOPY (EGD) WITH PROPOFOL;  Surgeon: Garlan Fair, MD;  [facility];  Service: Endoscopy;  Laterality: N/A;   HIATAL HERNIA REPAIR  05/31/2015   TOTAL KNEE ARTHROPLASTY Right 10/03/2019   Procedure: RIGHT TOTAL KNEE ARTHROPLASTY;  Surgeon: Melrose Nakayama, MD;  [facility];  Service: Orthopedics;  Laterality: Right;    There were no vitals filed for this visit.   Subjective Assessment - 07/15/20 1425     Subjective COVID-19 screen performed prior to patient entering clinic.  Feeling much better.    Pertinent History H/o LBP, HTN, DDD, right total knee replacement.                               [facility] Adult PT Treatment/Exercise - 07/15/20 0001       Modalities   Modalities Electrical Stimulation      Electrical Stimulation   Electrical Stimulation Location Bil lat hip region.    Electrical Stimulation Action Pre-mod.    Electrical Stimulation Parameters 80-150 Hz x 20 minutes.    Electrical Stimulation Goals Pain      Ultrasound   Ultrasound Location RT lateral hip.    Ultrasound Parameters Combo e'stim/US  at 1.50 W/CM2 x 12 minutes.    Ultrasound Goals Pain      Manual Therapy   Manual Therapy Soft tissue mobilization    Soft tissue mobilization STW/M x 11 minutes to patient's left lateral hip musculature.                         PT Long Term Goals - 07/03/20 1328       PT LONG TERM GOAL #1   Title Patient will be independent with HEP    Time 4    Period Weeks    Status New      PT LONG TERM GOAL #2   Title Sleep 6 hours undisturbed.    Time 6    Period Weeks    Status New      PT LONG TERM GOAL #3   Title Perform ADL's with a bilateral hip pain-level not > 2-3/10.    Time 6    Period Weeks     Status New                   Plan - 07/15/20 1432     Clinical Impression Statement Patient has done very well with a significant reduction in her bilateral hip pain.  She is requesting to placed on hold at this time.  She will continue with her stretching and strengthening exercises.    Personal Factors and Comorbidities Comorbidity 1;Comorbidity 2    Comorbidities H/o LBP, HTN, DDD, right total knee replacement.    Examination-Activity Limitations Other;Sleep    Examination-Participation Restrictions Other    Stability/Clinical Decision Making Evolving/Moderate complexity    Rehab Potential Good    PT Frequency 2x / week    PT Duration 4 weeks    PT Treatment/Interventions ADLs/Self Care Home Management;Cryotherapy;Electrical Stimulation;Iontophoresis 52m/ml Dexamethasone;Moist Heat;Neuromuscular re-education;Manual techniques;Passive range of motion;Therapeutic activities;Therapeutic exercise;Functional mobility training;Stair training;Gait training;Vasopneumatic Device;Taping;Balance training;Dry needling;Ultrasound;Joint Manipulations    PT Next Visit Plan Combo and STW/M to both hips, dry needling, lateral hip and ITB stretching.  SDLY hip abduction.    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  Current functional level related to goals / functional outcomes: See above.   Remaining deficits: See below   Education / Equipment: HEP.   Patient agrees to discharge.  Patient goals were not met. Patient is being discharged due to being pleased with the current functional level.  Ragna Kramlich, CMaliMPT 07/15/2020, 3:19 PM  [facility] [address]Phone: [phone]  Fax:  [phone] Name: ALaureen FredericMRN: [id number] of Birth: [birth year]

## 2020-07-31 DIAGNOSIS — M8580 Other specified disorders of bone density and structure, unspecified site: Secondary | ICD-10-CM | POA: Diagnosis not present

## 2020-07-31 DIAGNOSIS — R350 Frequency of micturition: Secondary | ICD-10-CM | POA: Diagnosis not present

## 2020-07-31 DIAGNOSIS — I10 Essential (primary) hypertension: Secondary | ICD-10-CM | POA: Diagnosis not present

## 2020-07-31 DIAGNOSIS — N3000 Acute cystitis without hematuria: Secondary | ICD-10-CM | POA: Diagnosis not present

## 2020-07-31 DIAGNOSIS — M47812 Spondylosis without myelopathy or radiculopathy, cervical region: Secondary | ICD-10-CM | POA: Diagnosis not present

## 2020-07-31 DIAGNOSIS — K219 Gastro-esophageal reflux disease without esophagitis: Secondary | ICD-10-CM | POA: Diagnosis not present

## 2020-07-31 DIAGNOSIS — E7800 Pure hypercholesterolemia, unspecified: Secondary | ICD-10-CM | POA: Diagnosis not present

## 2020-07-31 DIAGNOSIS — R309 Painful micturition, unspecified: Secondary | ICD-10-CM | POA: Diagnosis not present

## 2020-07-31 DIAGNOSIS — M1711 Unilateral primary osteoarthritis, right knee: Secondary | ICD-10-CM | POA: Diagnosis not present

## 2020-08-06 DIAGNOSIS — N3946 Mixed incontinence: Secondary | ICD-10-CM | POA: Diagnosis not present

## 2020-08-06 DIAGNOSIS — R350 Frequency of micturition: Secondary | ICD-10-CM | POA: Diagnosis not present

## 2020-08-06 DIAGNOSIS — N3020 Other chronic cystitis without hematuria: Secondary | ICD-10-CM | POA: Diagnosis not present

## 2020-08-07 DIAGNOSIS — Z1231 Encounter for screening mammogram for malignant neoplasm of breast: Secondary | ICD-10-CM | POA: Diagnosis not present

## 2020-08-15 DIAGNOSIS — Z85828 Personal history of other malignant neoplasm of skin: Secondary | ICD-10-CM | POA: Diagnosis not present

## 2020-08-15 DIAGNOSIS — D485 Neoplasm of uncertain behavior of skin: Secondary | ICD-10-CM | POA: Diagnosis not present

## 2020-08-15 DIAGNOSIS — L820 Inflamed seborrheic keratosis: Secondary | ICD-10-CM | POA: Diagnosis not present

## 2020-08-15 DIAGNOSIS — D225 Melanocytic nevi of trunk: Secondary | ICD-10-CM | POA: Diagnosis not present

## 2020-08-15 DIAGNOSIS — D1801 Hemangioma of skin and subcutaneous tissue: Secondary | ICD-10-CM | POA: Diagnosis not present

## 2020-08-15 DIAGNOSIS — L905 Scar conditions and fibrosis of skin: Secondary | ICD-10-CM | POA: Diagnosis not present

## 2020-08-15 DIAGNOSIS — M6748 Ganglion, other site: Secondary | ICD-10-CM | POA: Diagnosis not present

## 2020-08-15 DIAGNOSIS — L814 Other melanin hyperpigmentation: Secondary | ICD-10-CM | POA: Diagnosis not present

## 2020-08-15 DIAGNOSIS — L570 Actinic keratosis: Secondary | ICD-10-CM | POA: Diagnosis not present

## 2020-08-15 DIAGNOSIS — L821 Other seborrheic keratosis: Secondary | ICD-10-CM | POA: Diagnosis not present

## 2020-08-16 DIAGNOSIS — R072 Precordial pain: Secondary | ICD-10-CM | POA: Diagnosis not present

## 2020-09-13 DIAGNOSIS — K219 Gastro-esophageal reflux disease without esophagitis: Secondary | ICD-10-CM | POA: Diagnosis not present

## 2020-09-17 DIAGNOSIS — K219 Gastro-esophageal reflux disease without esophagitis: Secondary | ICD-10-CM | POA: Diagnosis not present

## 2020-09-17 DIAGNOSIS — I10 Essential (primary) hypertension: Secondary | ICD-10-CM | POA: Diagnosis not present

## 2020-09-17 DIAGNOSIS — M47812 Spondylosis without myelopathy or radiculopathy, cervical region: Secondary | ICD-10-CM | POA: Diagnosis not present

## 2020-09-17 DIAGNOSIS — M8580 Other specified disorders of bone density and structure, unspecified site: Secondary | ICD-10-CM | POA: Diagnosis not present

## 2020-09-17 DIAGNOSIS — E7800 Pure hypercholesterolemia, unspecified: Secondary | ICD-10-CM | POA: Diagnosis not present

## 2020-09-17 DIAGNOSIS — M1711 Unilateral primary osteoarthritis, right knee: Secondary | ICD-10-CM | POA: Diagnosis not present

## 2020-09-20 DIAGNOSIS — M7062 Trochanteric bursitis, left hip: Secondary | ICD-10-CM | POA: Diagnosis not present

## 2020-09-20 DIAGNOSIS — M25519 Pain in unspecified shoulder: Secondary | ICD-10-CM | POA: Diagnosis not present

## 2020-09-20 DIAGNOSIS — M7061 Trochanteric bursitis, right hip: Secondary | ICD-10-CM | POA: Diagnosis not present

## 2020-09-20 DIAGNOSIS — Z7952 Long term (current) use of systemic steroids: Secondary | ICD-10-CM | POA: Diagnosis not present

## 2020-09-20 DIAGNOSIS — M2550 Pain in unspecified joint: Secondary | ICD-10-CM | POA: Diagnosis not present

## 2020-09-20 DIAGNOSIS — M25539 Pain in unspecified wrist: Secondary | ICD-10-CM | POA: Diagnosis not present

## 2020-09-20 DIAGNOSIS — M25529 Pain in unspecified elbow: Secondary | ICD-10-CM | POA: Diagnosis not present

## 2020-09-20 DIAGNOSIS — Z79899 Other long term (current) drug therapy: Secondary | ICD-10-CM | POA: Diagnosis not present

## 2020-10-14 DIAGNOSIS — A09 Infectious gastroenteritis and colitis, unspecified: Secondary | ICD-10-CM | POA: Diagnosis not present

## 2020-10-14 DIAGNOSIS — R079 Chest pain, unspecified: Secondary | ICD-10-CM | POA: Diagnosis not present

## 2020-10-14 DIAGNOSIS — K219 Gastro-esophageal reflux disease without esophagitis: Secondary | ICD-10-CM | POA: Diagnosis not present

## 2020-10-14 DIAGNOSIS — H1132 Conjunctival hemorrhage, left eye: Secondary | ICD-10-CM | POA: Diagnosis not present

## 2020-10-17 DIAGNOSIS — H6123 Impacted cerumen, bilateral: Secondary | ICD-10-CM | POA: Diagnosis not present

## 2020-10-17 DIAGNOSIS — H60503 Unspecified acute noninfective otitis externa, bilateral: Secondary | ICD-10-CM | POA: Diagnosis not present

## 2020-10-18 DIAGNOSIS — N3020 Other chronic cystitis without hematuria: Secondary | ICD-10-CM | POA: Diagnosis not present

## 2020-10-18 DIAGNOSIS — R109 Unspecified abdominal pain: Secondary | ICD-10-CM | POA: Diagnosis not present

## 2020-10-21 DIAGNOSIS — Z01812 Encounter for preprocedural laboratory examination: Secondary | ICD-10-CM | POA: Diagnosis not present

## 2020-10-24 DIAGNOSIS — Z9889 Other specified postprocedural states: Secondary | ICD-10-CM | POA: Diagnosis not present

## 2020-10-24 DIAGNOSIS — R0789 Other chest pain: Secondary | ICD-10-CM | POA: Diagnosis not present

## 2020-10-24 DIAGNOSIS — K319 Disease of stomach and duodenum, unspecified: Secondary | ICD-10-CM | POA: Diagnosis not present

## 2020-10-24 DIAGNOSIS — K449 Diaphragmatic hernia without obstruction or gangrene: Secondary | ICD-10-CM | POA: Diagnosis not present

## 2020-10-24 DIAGNOSIS — R12 Heartburn: Secondary | ICD-10-CM | POA: Diagnosis not present

## 2020-10-24 DIAGNOSIS — K3189 Other diseases of stomach and duodenum: Secondary | ICD-10-CM | POA: Diagnosis not present

## 2020-10-24 DIAGNOSIS — R197 Diarrhea, unspecified: Secondary | ICD-10-CM | POA: Diagnosis not present

## 2020-10-30 DIAGNOSIS — K319 Disease of stomach and duodenum, unspecified: Secondary | ICD-10-CM | POA: Diagnosis not present

## 2020-11-27 DIAGNOSIS — K449 Diaphragmatic hernia without obstruction or gangrene: Secondary | ICD-10-CM | POA: Diagnosis not present

## 2020-11-29 ENCOUNTER — Other Ambulatory Visit: Payer: Self-pay | Admitting: General Surgery

## 2020-11-29 DIAGNOSIS — K449 Diaphragmatic hernia without obstruction or gangrene: Secondary | ICD-10-CM

## 2020-12-04 DIAGNOSIS — Z96651 Presence of right artificial knee joint: Secondary | ICD-10-CM | POA: Diagnosis not present

## 2020-12-04 DIAGNOSIS — M1712 Unilateral primary osteoarthritis, left knee: Secondary | ICD-10-CM | POA: Diagnosis not present

## 2020-12-09 DIAGNOSIS — K219 Gastro-esophageal reflux disease without esophagitis: Secondary | ICD-10-CM | POA: Diagnosis not present

## 2020-12-09 DIAGNOSIS — M1711 Unilateral primary osteoarthritis, right knee: Secondary | ICD-10-CM | POA: Diagnosis not present

## 2020-12-09 DIAGNOSIS — M47812 Spondylosis without myelopathy or radiculopathy, cervical region: Secondary | ICD-10-CM | POA: Diagnosis not present

## 2020-12-09 DIAGNOSIS — M170 Bilateral primary osteoarthritis of knee: Secondary | ICD-10-CM | POA: Diagnosis not present

## 2020-12-09 DIAGNOSIS — I10 Essential (primary) hypertension: Secondary | ICD-10-CM | POA: Diagnosis not present

## 2020-12-09 DIAGNOSIS — M8580 Other specified disorders of bone density and structure, unspecified site: Secondary | ICD-10-CM | POA: Diagnosis not present

## 2020-12-09 DIAGNOSIS — E7800 Pure hypercholesterolemia, unspecified: Secondary | ICD-10-CM | POA: Diagnosis not present

## 2020-12-11 ENCOUNTER — Ambulatory Visit
Admission: RE | Admit: 2020-12-11 | Discharge: 2020-12-11 | Disposition: A | Discharge status: Home / Self Care | Payer: Medicare HMO | Source: Ambulatory Visit | Attending: General Surgery | Admitting: General Surgery

## 2020-12-11 DIAGNOSIS — K449 Diaphragmatic hernia without obstruction or gangrene: Secondary | ICD-10-CM

## 2020-12-11 DIAGNOSIS — K224 Dyskinesia of esophagus: Secondary | ICD-10-CM | POA: Diagnosis not present

## 2020-12-18 ENCOUNTER — Other Ambulatory Visit: Payer: Medicare HMO

## 2020-12-18 DIAGNOSIS — M7062 Trochanteric bursitis, left hip: Secondary | ICD-10-CM | POA: Diagnosis not present

## 2020-12-18 DIAGNOSIS — M7061 Trochanteric bursitis, right hip: Secondary | ICD-10-CM | POA: Diagnosis not present

## 2020-12-18 DIAGNOSIS — M353 Polymyalgia rheumatica: Secondary | ICD-10-CM | POA: Diagnosis not present

## 2020-12-18 DIAGNOSIS — M25562 Pain in left knee: Secondary | ICD-10-CM | POA: Diagnosis not present

## 2020-12-18 DIAGNOSIS — Z79899 Other long term (current) drug therapy: Secondary | ICD-10-CM | POA: Diagnosis not present

## 2020-12-19 DIAGNOSIS — K219 Gastro-esophageal reflux disease without esophagitis: Secondary | ICD-10-CM | POA: Diagnosis not present

## 2020-12-19 DIAGNOSIS — M7061 Trochanteric bursitis, right hip: Secondary | ICD-10-CM | POA: Diagnosis not present

## 2020-12-19 DIAGNOSIS — M7062 Trochanteric bursitis, left hip: Secondary | ICD-10-CM | POA: Diagnosis not present

## 2020-12-19 DIAGNOSIS — I10 Essential (primary) hypertension: Secondary | ICD-10-CM | POA: Diagnosis not present

## 2020-12-19 DIAGNOSIS — Z1331 Encounter for screening for depression: Secondary | ICD-10-CM | POA: Diagnosis not present

## 2020-12-19 DIAGNOSIS — M170 Bilateral primary osteoarthritis of knee: Secondary | ICD-10-CM | POA: Diagnosis not present

## 2020-12-19 DIAGNOSIS — Z0000 Encounter for general adult medical examination without abnormal findings: Secondary | ICD-10-CM | POA: Diagnosis not present

## 2020-12-19 DIAGNOSIS — Z1389 Encounter for screening for other disorder: Secondary | ICD-10-CM | POA: Diagnosis not present

## 2020-12-19 DIAGNOSIS — M4696 Unspecified inflammatory spondylopathy, lumbar region: Secondary | ICD-10-CM | POA: Diagnosis not present

## 2020-12-19 DIAGNOSIS — Z1159 Encounter for screening for other viral diseases: Secondary | ICD-10-CM | POA: Diagnosis not present

## 2020-12-19 DIAGNOSIS — N3020 Other chronic cystitis without hematuria: Secondary | ICD-10-CM | POA: Diagnosis not present

## 2021-01-22 DIAGNOSIS — K449 Diaphragmatic hernia without obstruction or gangrene: Secondary | ICD-10-CM | POA: Diagnosis not present

## 2021-02-10 DIAGNOSIS — M8580 Other specified disorders of bone density and structure, unspecified site: Secondary | ICD-10-CM | POA: Diagnosis not present

## 2021-02-10 DIAGNOSIS — K219 Gastro-esophageal reflux disease without esophagitis: Secondary | ICD-10-CM | POA: Diagnosis not present

## 2021-02-10 DIAGNOSIS — M170 Bilateral primary osteoarthritis of knee: Secondary | ICD-10-CM | POA: Diagnosis not present

## 2021-02-10 DIAGNOSIS — E7800 Pure hypercholesterolemia, unspecified: Secondary | ICD-10-CM | POA: Diagnosis not present

## 2021-02-10 DIAGNOSIS — I10 Essential (primary) hypertension: Secondary | ICD-10-CM | POA: Diagnosis not present

## 2021-02-10 DIAGNOSIS — M47812 Spondylosis without myelopathy or radiculopathy, cervical region: Secondary | ICD-10-CM | POA: Diagnosis not present

## 2021-03-24 ENCOUNTER — Encounter (HOSPITAL_COMMUNITY): Payer: Self-pay | Admitting: *Deleted

## 2021-03-24 ENCOUNTER — Other Ambulatory Visit: Payer: Self-pay

## 2021-03-24 ENCOUNTER — Emergency Department (HOSPITAL_COMMUNITY)
Admission: EM | Admit: 2021-03-24 | Discharge: 2021-03-24 | Disposition: A | Discharge status: Home / Self Care | Payer: Medicare HMO | Attending: Emergency Medicine | Admitting: Emergency Medicine

## 2021-03-24 DIAGNOSIS — Z79899 Other long term (current) drug therapy: Secondary | ICD-10-CM | POA: Diagnosis not present

## 2021-03-24 DIAGNOSIS — Z96651 Presence of right artificial knee joint: Secondary | ICD-10-CM | POA: Diagnosis not present

## 2021-03-24 DIAGNOSIS — F17210 Nicotine dependence, cigarettes, uncomplicated: Secondary | ICD-10-CM | POA: Diagnosis not present

## 2021-03-24 DIAGNOSIS — I10 Essential (primary) hypertension: Secondary | ICD-10-CM | POA: Insufficient documentation

## 2021-03-24 DIAGNOSIS — Z85828 Personal history of other malignant neoplasm of skin: Secondary | ICD-10-CM | POA: Diagnosis not present

## 2021-03-24 DIAGNOSIS — U071 COVID-19: Secondary | ICD-10-CM | POA: Insufficient documentation

## 2021-03-24 DIAGNOSIS — R519 Headache, unspecified: Secondary | ICD-10-CM | POA: Diagnosis not present

## 2021-03-24 DIAGNOSIS — R69 Illness, unspecified: Secondary | ICD-10-CM | POA: Diagnosis not present

## 2021-03-24 LAB — COMPREHENSIVE METABOLIC PANEL
ALT: 15 U/L (ref 0–44)
AST: 26 U/L (ref 15–41)
Albumin: 4.2 g/dL (ref 3.5–5.0)
Alkaline Phosphatase: 71 U/L (ref 38–126)
Anion gap: 11 (ref 5–15)
BUN: 13 mg/dL (ref 8–23)
CO2: 30 mmol/L (ref 22–32)
Calcium: 9.6 mg/dL (ref 8.9–10.3)
Chloride: 94 mmol/L — ABNORMAL LOW (ref 98–111)
Creatinine, Ser: 0.81 mg/dL (ref 0.44–1.00)
GFR, Estimated: >60 mL/min (ref >60)
Glucose, Bld: 107 mg/dL — ABNORMAL HIGH (ref 70–99)
Potassium: 3.3 mmol/L — ABNORMAL LOW (ref 3.5–5.1)
Sodium: 135 mmol/L (ref 135–145)
Total Bilirubin: 0.6 mg/dL (ref 0.3–1.2)
Total Protein: 7.5 g/dL (ref 6.5–8.1)

## 2021-03-24 LAB — CBC
HCT: 42.4 % (ref 36.0–46.0)
Hemoglobin: 14.3 g/dL (ref 12.0–15.0)
MCH: 31.2 pg (ref 26.0–34.0)
MCHC: 33.7 g/dL (ref 30.0–36.0)
MCV: 92.6 fL (ref 80.0–100.0)
Platelets: 207 10*3/uL (ref 150–400)
RBC: 4.58 MIL/uL (ref 3.87–5.11)
RDW: 13.3 % (ref 11.5–15.5)
WBC: 4.3 10*3/uL (ref 4.0–10.5)
nRBC: 0 % (ref 0.0–0.2)

## 2021-03-24 LAB — LIPASE, BLOOD: Lipase: 27 U/L (ref 11–51)

## 2021-03-24 LAB — POC OCCULT BLOOD, ED: Fecal Occult Bld: NEGATIVE

## 2021-03-24 MED ORDER — NIRMATRELVIR/RITONAVIR (PAXLOVID)TABLET: 3.0000 | ORAL_TABLET | Freq: Two times a day (BID) | ORAL | 0 refills | Status: AC

## 2021-03-24 NOTE — ED Notes (Signed)
Pt reports black stools x 2 days, denies taking blood thinners, tested covid positive on Sunday with home test. No other complaints.

## 2021-03-24 NOTE — ED Triage Notes (Signed)
Rectal bleeding, had positive covid test  at home

## 2021-03-24 NOTE — ED Provider Notes (Signed)
[facility] EMERGENCY DEPARTMENT Provider Note  CSN: [id number] Arrival date & time: 03/24/21 1717    History Chief Complaint  Patient presents with   Abdominal Pain    Julie Day is a 75 y.o. female with history of hiatal hernia s/p nissen fundoplication reports she was having headache, cough, body aches, fevers and diarrhea starting 5 days ago, had neg Covid test 3 days ago but positive home covid test yesterday. She had taken a few doses of peptobismol for her diarrhea and then noticed dark black stools. She is not having any abdominal pain or vomiting. She was concerned about UGI bleeding.    Past Medical History:  Diagnosis Date   Allergic rhinitis    Anginal pain ([place])    Dx as GERD   Atypical mole 09/23/2017   Left Inner Thigh-Mild   Atypical mole 09/23/2017   Right Axilla-Mild   Atypical mole 09/05/2015   Center Abdomen-Severe (clear) (Dr. [place] Crane)   Atypical mole 04/14/2019   Right Thigh-Solar Lentigo   Chronic cystitis    Degenerative disc disease    GERD (gastroesophageal reflux disease)    Hemangioma of liver    History of hiatal hernia 2017   Hypertension    SCC (squamous cell carcinoma) 09/05/2015   Left Dorsal Hand-Well Diff Keratoacanthoma (Dr. Nevada Crane) (free)   SCC (squamous cell carcinoma) 04/14/2019   Left Outer Shin-Keratoacanthoma (treatment after biopsy)   SCC (squamous cell carcinoma) 07/21/2019   Right Thigh-Keratoacanthoma (treatment Fluorouracil Cream)   Squamous cell carcinoma of skin 09/23/2017   Right Upper Shin-Well Diff (Cx3,5FU)    Past Surgical History:  Procedure Laterality Date   ABDOMINAL HYSTERECTOMY     CHOLECYSTECTOMY     COLON RESECTION N/A 05/31/2015   Procedure: LAPAROSCOPIC REPAIR OF HIATAL HERNIA  AND LAPAROSCOPIC NISSEN FUNDOPLICATION;  Surgeon: Fanny Skates, MD;  Location: [place];  Service: General;  Laterality: N/A;   ESOPHAGEAL MANOMETRY N/A 02/27/2015   Procedure: ESOPHAGEAL MANOMETRY (EM);  Surgeon: Garlan Fair,  MD;  Location: [facility];  Service: Endoscopy;  Laterality: N/A;   ESOPHAGOGASTRODUODENOSCOPY (EGD) WITH PROPOFOL N/A 02/26/2015   Procedure: ESOPHAGOGASTRODUODENOSCOPY (EGD) WITH PROPOFOL;  Surgeon: Garlan Fair, MD;  Location: [facility];  Service: Endoscopy;  Laterality: N/A;   HIATAL HERNIA REPAIR  05/31/2015   TOTAL KNEE ARTHROPLASTY Right 10/03/2019   Procedure: RIGHT TOTAL KNEE ARTHROPLASTY;  Surgeon: Melrose Nakayama, MD;  Location: [facility];  Service: Orthopedics;  Laterality: Right;    Family History  Problem Relation Age of Onset   Hypertension Mother    Hypertension Sister     Social History   Tobacco Use   Smoking status: Some Days    Types: Cigarettes    Last attempt to quit: 12/20/2008    Years since quitting: 12.2   Smokeless tobacco: Never  Vaping Use   Vaping Use: Never used  Substance Use Topics   Alcohol use: No   Drug use: No     Home Medications Prior to Admission medications   Medication Sig Start Date End Date Taking? Authorizing Provider  nirmatrelvir/ritonavir EUA (PAXLOVID) 20 x 150 MG & 10 x '100MG'$  TABS Take 3 tablets by mouth 2 (two) times daily for 5 days. Patient GFR is >60. Take nirmatrelvir (150 mg) two tablets twice daily for 5 days and ritonavir (100 mg) one tablet twice daily for 5 days. 03/24/21 03/29/21 Yes Truddie Hidden, MD  Biotin 5000 MCG TABS Take 5,000 mcg by mouth daily with supper.  [provider]  Calcium Carb-Cholecalciferol (CALCIUM 500 + D3 PO) Take 2 tablets by mouth daily.    [provider]  cetirizine (ZYRTEC) 10 MG tablet Take 10 mg by mouth daily.     [provider]  docusate sodium (COLACE) 50 MG capsule Take 50 mg by mouth at bedtime.    [provider]  folic acid (FOLVITE) 1 MG tablet Take 1 mg by mouth daily. 08/02/19   [provider]  Multiple Vitamins-Minerals (HAIR FORMULA EXTRA STRENGTH PO) Take 1 capsule by mouth in the morning and at bedtime. Viviscal Hair  Growth Supplement 08/21/19   [provider]  nitroGLYCERIN (NITROSTAT) 0.4 MG SL tablet Place 0.4 mg under the tongue every 5 (five) minutes as needed for chest pain.    [provider]  phenazopyridine (PYRIDIUM) 95 MG tablet Take 95 mg by mouth 3 (three) times daily as needed for pain.    [provider]  promethazine (PHENERGAN) 12.5 MG tablet Take 1-2 tablets (12.5-25 mg total) by mouth every 6 (six) hours as needed for nausea or vomiting. 10/03/19   Loni Dolly, PA-C  triamterene-hydrochlorothiazide (MAXZIDE-25) 37.5-25 MG per tablet Take 1 tablet by mouth daily.     [provider]  trimethoprim (TRIMPEX) 100 MG tablet Take 100 mg by mouth daily. 09/13/19   [provider]  vitamin B-12 (CYANOCOBALAMIN) 1000 MCG tablet Take 1,000 mcg by mouth daily.    [provider]     Allergies    Codeine   Review of Systems   Review of Systems A comprehensive review of systems was completed and negative except as noted in HPI.    Physical Exam BP (!) 146/76 (BP Location: Right Arm)   Pulse 68   Temp 98 F (36.7 C) (Oral)   Resp 18   Ht '5\' 4"'$  (1.626 m)   Wt 63.5 kg   SpO2 97%   BMI 24.03 kg/m   Physical Exam Vitals and nursing note reviewed.  Constitutional:      Appearance: Normal appearance.  HENT:     Head: Normocephalic and atraumatic.     Nose: Nose normal.     Mouth/Throat:     Mouth: Mucous membranes are moist.  Eyes:     Extraocular Movements: Extraocular movements intact.     Conjunctiva/sclera: Conjunctivae normal.  Cardiovascular:     Rate and Rhythm: Normal rate.  Pulmonary:     Effort: Pulmonary effort is normal.     Breath sounds: Normal breath sounds.  Abdominal:     General: Abdomen is flat.     Palpations: Abdomen is soft.     Tenderness: There is no abdominal tenderness.  Genitourinary:    Comments: Rectal: Chaperone present, scant black stool in rectal vault, hemoccult neg Musculoskeletal:         General: No swelling. Normal range of motion.     Cervical back: Neck supple.  Skin:    General: Skin is warm and dry.  Neurological:     General: No focal deficit present.     Mental Status: She is alert.  Psychiatric:        Mood and Affect: Mood normal.     ED Results / Procedures / Treatments   Labs (all labs ordered are listed, but only abnormal results are displayed) Labs Reviewed  COMPREHENSIVE METABOLIC PANEL - Abnormal; Notable for the following components:      Result Value   Potassium 3.3 (*)    Chloride 94 (*)  Glucose, Bld 107 (*)    All other components within normal limits  LIPASE, BLOOD  CBC  URINALYSIS, ROUTINE W REFLEX MICROSCOPIC  POC OCCULT BLOOD, ED    EKG None  Radiology No results found.  Procedures Procedures  Medications Ordered in the ED Medications - No data to display   MDM Rules/Calculators/A&P MDM Labs done in triage are unremarkable. Hemoccult is neg, suspect black stools are from pepto use. She was reassured no signs of GI bleeding. Will Rx Paxlovid for Covid still in the treatment window. PCP follow up.   ED Course  I have reviewed the triage vital signs and the nursing notes.  Pertinent labs & imaging results that were available during my care of the patient were reviewed by me and considered in my medical decision making (see chart for details).     Final Clinical Impression(s) / ED Diagnoses Final diagnoses:  COVID    Rx / DC Orders ED Discharge Orders          Ordered    nirmatrelvir/ritonavir EUA (PAXLOVID) 20 x 150 MG & 10 x '100MG'$  TABS  2 times daily        03/24/21 2222             Truddie Hidden, MD 03/24/21 2222

## 2021-04-23 DIAGNOSIS — M7062 Trochanteric bursitis, left hip: Secondary | ICD-10-CM | POA: Diagnosis not present

## 2021-04-23 DIAGNOSIS — Z79899 Other long term (current) drug therapy: Secondary | ICD-10-CM | POA: Diagnosis not present

## 2021-04-23 DIAGNOSIS — M7061 Trochanteric bursitis, right hip: Secondary | ICD-10-CM | POA: Diagnosis not present

## 2021-04-23 DIAGNOSIS — M353 Polymyalgia rheumatica: Secondary | ICD-10-CM | POA: Diagnosis not present

## 2021-04-23 DIAGNOSIS — Y939 Activity, unspecified: Secondary | ICD-10-CM | POA: Diagnosis not present

## 2021-04-23 DIAGNOSIS — Z7952 Long term (current) use of systemic steroids: Secondary | ICD-10-CM | POA: Diagnosis not present

## 2021-04-27 IMAGING — CR DG CHEST 2V
2 series · 2 of 2 positions shown · non-contrast
Comparison: Chest x-ray 05/06/2015.

CLINICAL DATA: 73-year-old female under preoperative evaluation for
knee replacement.

EXAM:
CHEST - 2 VIEW

[w chest pa]
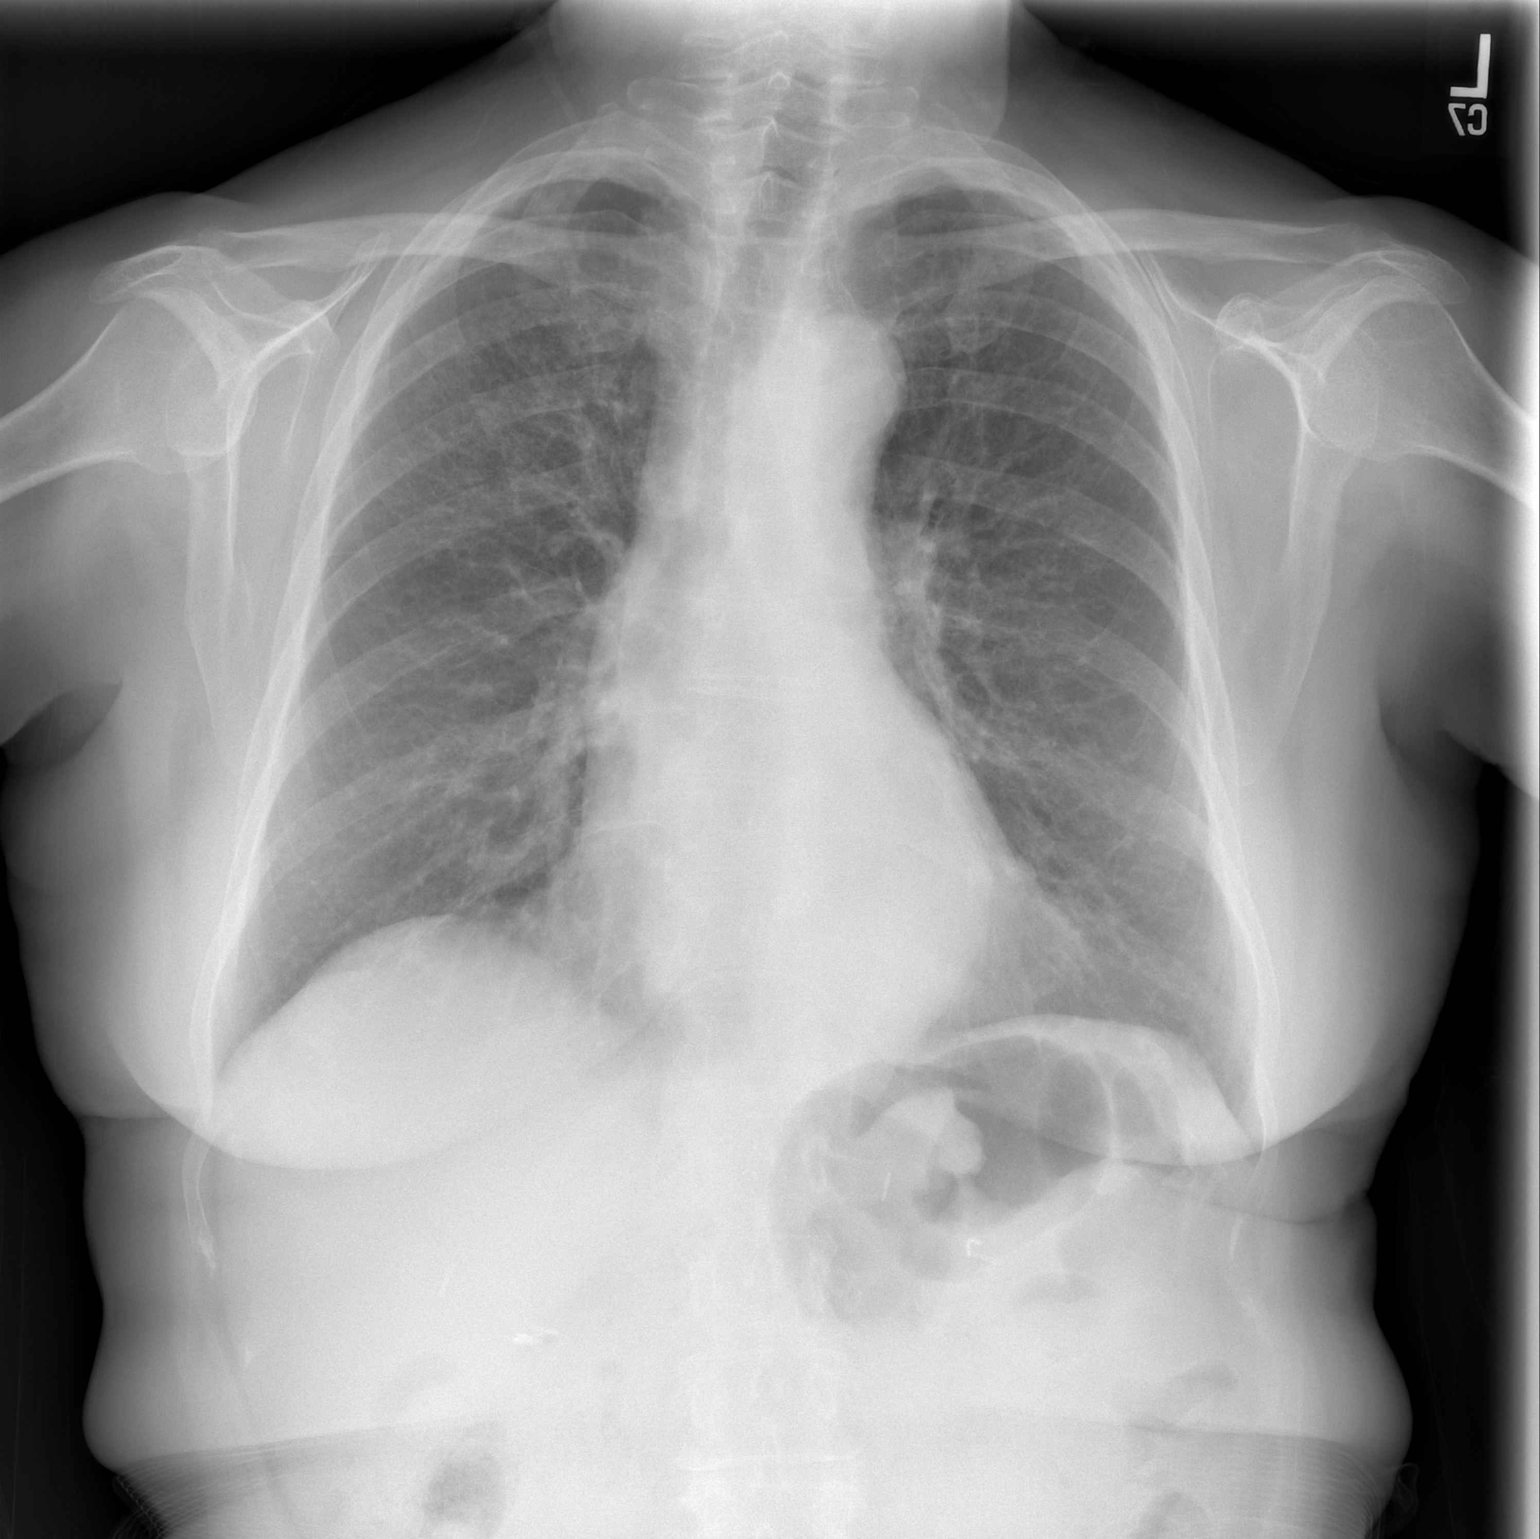

[w chest lat]
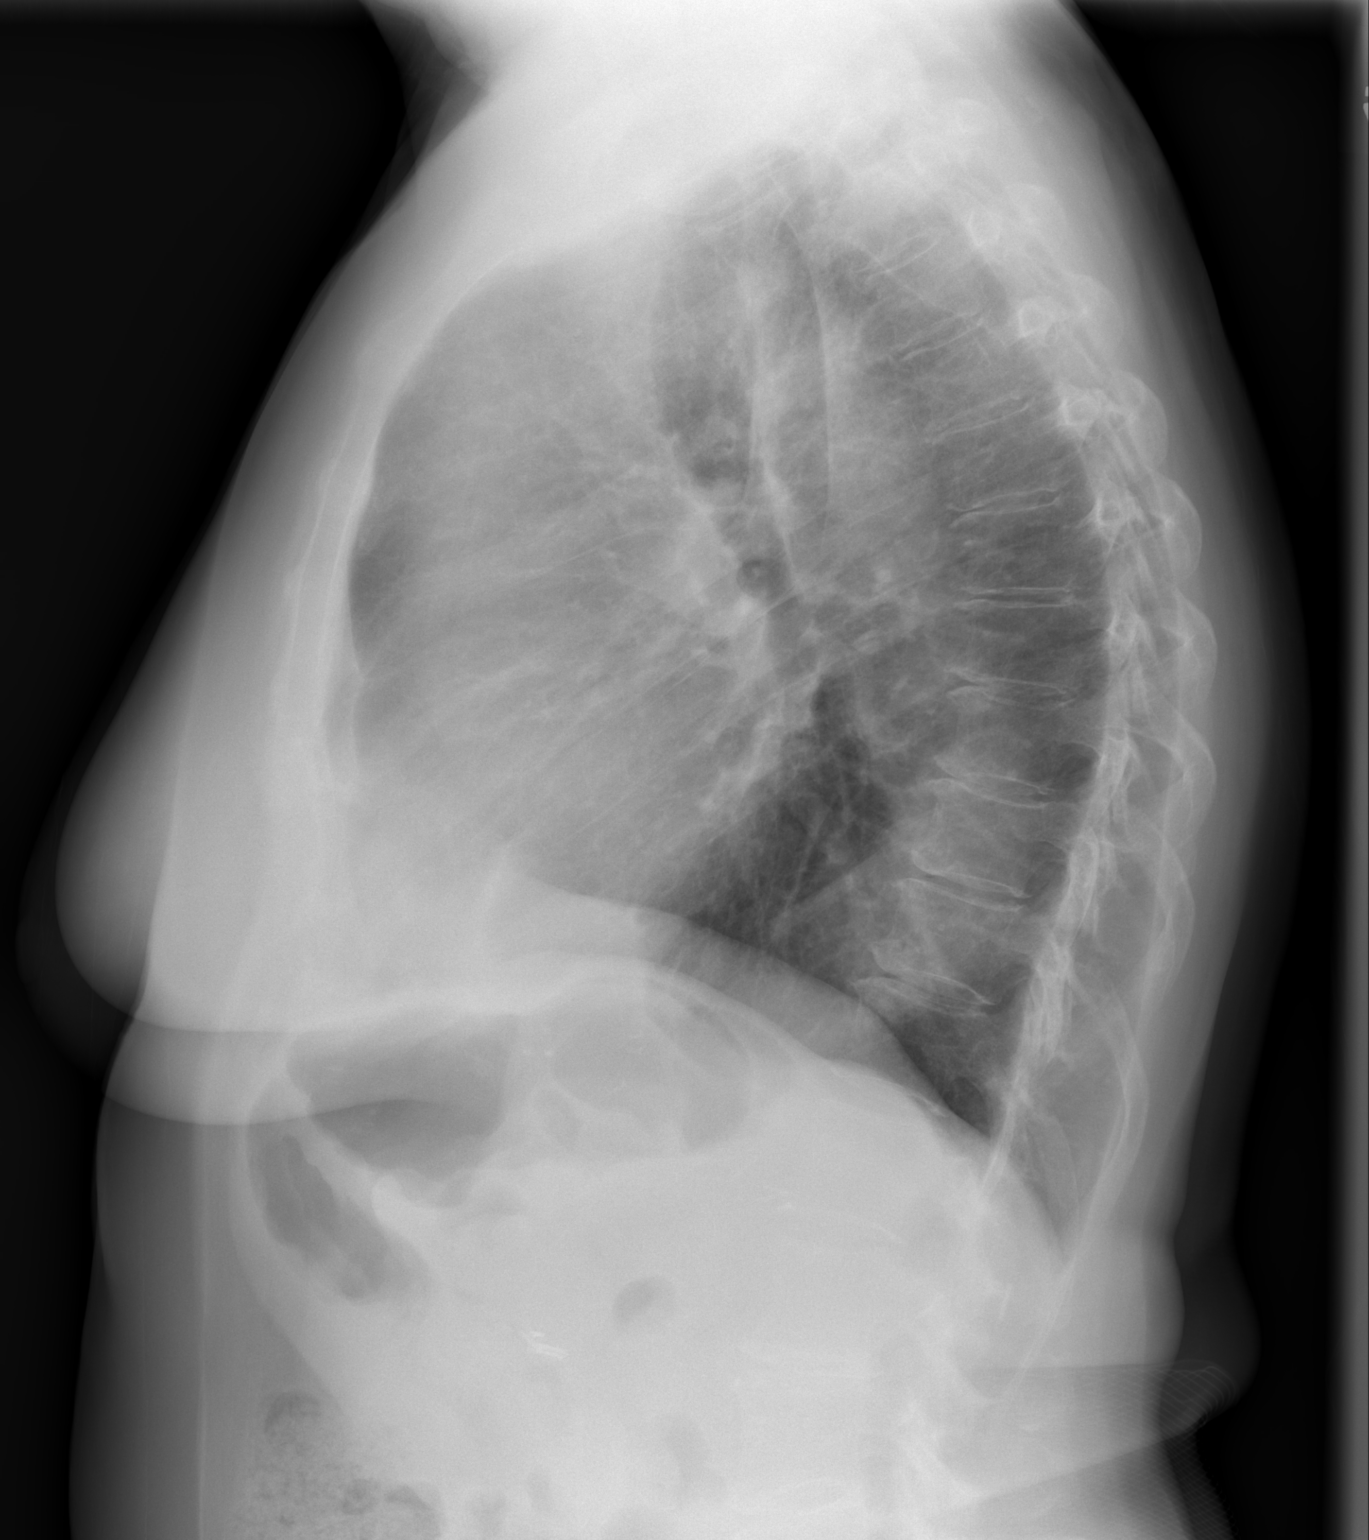

[2 of 2 positions shown; findings below may reference images not displayed]

FINDINGS: Lung volumes are normal. No consolidative airspace disease. No
pleural effusions. No pneumothorax. No pulmonary nodule or mass
noted. Tortuosity of the descending thoracic aorta. Pulmonary
vasculature and the cardiomediastinal silhouette are otherwise
within normal limits. Surgical clips project over the right upper
quadrant of the abdomen, likely from prior cholecystectomy.
IMPRESSION: No radiographic evidence of acute cardiopulmonary disease.

## 2021-05-08 DIAGNOSIS — N3020 Other chronic cystitis without hematuria: Secondary | ICD-10-CM | POA: Diagnosis not present

## 2021-05-08 DIAGNOSIS — N3946 Mixed incontinence: Secondary | ICD-10-CM | POA: Diagnosis not present

## 2021-07-01 ENCOUNTER — Other Ambulatory Visit: Payer: Self-pay | Admitting: Internal Medicine

## 2021-07-01 ENCOUNTER — Ambulatory Visit
Admission: RE | Admit: 2021-07-01 | Discharge: 2021-07-01 | Disposition: A | Discharge status: Home / Self Care | Payer: Medicare HMO | Source: Ambulatory Visit | Attending: Internal Medicine | Admitting: Internal Medicine

## 2021-07-01 DIAGNOSIS — K219 Gastro-esophageal reflux disease without esophagitis: Secondary | ICD-10-CM | POA: Diagnosis not present

## 2021-07-01 DIAGNOSIS — R053 Chronic cough: Secondary | ICD-10-CM

## 2021-07-01 DIAGNOSIS — I10 Essential (primary) hypertension: Secondary | ICD-10-CM | POA: Diagnosis not present

## 2021-07-01 DIAGNOSIS — Z79899 Other long term (current) drug therapy: Secondary | ICD-10-CM | POA: Diagnosis not present

## 2021-07-01 DIAGNOSIS — R634 Abnormal weight loss: Secondary | ICD-10-CM | POA: Diagnosis not present

## 2021-07-30 DIAGNOSIS — M7062 Trochanteric bursitis, left hip: Secondary | ICD-10-CM | POA: Diagnosis not present

## 2021-07-30 DIAGNOSIS — M7061 Trochanteric bursitis, right hip: Secondary | ICD-10-CM | POA: Diagnosis not present

## 2021-07-30 DIAGNOSIS — M353 Polymyalgia rheumatica: Secondary | ICD-10-CM | POA: Diagnosis not present

## 2021-07-30 DIAGNOSIS — Z9884 Bariatric surgery status: Secondary | ICD-10-CM | POA: Diagnosis not present

## 2021-08-08 DIAGNOSIS — K449 Diaphragmatic hernia without obstruction or gangrene: Secondary | ICD-10-CM | POA: Diagnosis not present

## 2021-08-08 DIAGNOSIS — Z9889 Other specified postprocedural states: Secondary | ICD-10-CM | POA: Diagnosis not present

## 2021-08-13 DIAGNOSIS — Z1231 Encounter for screening mammogram for malignant neoplasm of breast: Secondary | ICD-10-CM | POA: Diagnosis not present

## 2021-08-18 NOTE — Progress Notes (Signed)
Surgery orders requested with Dr. Amie [place] office.

## 2021-08-28 NOTE — Patient Instructions (Addendum)
DUE TO COVID-19 ONLY ONE VISITOR IS ALLOWED TO COME WITH YOU AND STAY IN THE WAITING ROOM ONLY DURING PRE OP AND PROCEDURE.   **NO VISITORS ARE ALLOWED IN THE SHORT STAY AREA OR RECOVERY ROOM!!**  IF YOU WILL BE ADMITTED INTO THE HOSPITAL YOU ARE ALLOWED ONLY TWO SUPPORT PEOPLE DURING VISITATION HOURS ONLY (7 AM -8PM)   The support person(s) must pass our screening, gel in and out, and wear a mask at all times, including in the patients room. Patients must also wear a mask when staff or their support person are in the room. Visitors GUEST BADGE MUST BE WORN VISIBLY  One adult visitor may remain with you overnight and MUST be in the room by 8 P.M.  No visitors under the age of 60. Any visitor under the age of 33 must be accompanied by an adult.    COVID SWAB TESTING MUST BE COMPLETED ON:  09/04/21 @ 12:15 PM   Site: [facility] [place] [facility] [place] Enter: Main Entrance have a seat in the waiting area to the right of main entrance (DO NOT [place]!!!!!) Dial: [phone] to alert staff you have arrived  You are not required to quarantine, however you are required to wear a well-fitted mask when you are out and around people not in your household.  Hand Hygiene often Do NOT share personal items Notify your provider if you are in close contact with someone who has COVID or you develop fever 100.4 or greater, new onset of sneezing, cough, sore throat, shortness of breath or body aches.   Your procedure is scheduled on: 09/08/21   Report to [facility] Main Entrance    Report to admitting at 5:15 AM   Call this number if you have problems the morning of surgery [phone]   Do not eat food :After Midnight.   May have liquids until 4:30 AM day of surgery  CLEAR LIQUID DIET  Foods Allowed                                                                     Foods Excluded  Water, Black Coffee and tea, regular and decaf                              liquids that you cannot  Plain Jell-O in any flavor  (No red)                                           see through such as: Fruit ices (not with fruit pulp)                                     milk, soups, orange juice              Iced Popsicles (No red)  All solid food                                   Apple juices Sports drinks like Gatorade (No red) Lightly seasoned clear broth or consume(fat free) Sugar, honey syrup  Follow bowel prep instructions given to you by surgeon if applicable.     Oral Hygiene is also important to reduce your risk of infection.                                    Remember - BRUSH YOUR TEETH THE MORNING OF SURGERY WITH YOUR REGULAR TOOTHPASTE   Do NOT smoke after Midnight   Stop all vitamins and supplements 7 days before surgery   Take these medicines the morning of surgery with A SIP OF WATER: Protonix                              You may not have any metal on your body including hair pins, jewelry, and body piercing             Do not wear make-up, lotions, powders, perfumes, or deodorant  Do not wear nail polish including gel and S&S, artificial/acrylic nails, or any other type of covering on natural nails including finger and toenails. If you have artificial nails, gel coating, etc. that needs to be removed by a nail salon please have this removed prior to surgery or surgery may need to be canceled/ delayed if the surgeon/ anesthesia feels like they are unable to be safely monitored.   Do not shave  48 hours prior to surgery.    Do not bring valuables to the hospital. [place].   Contacts, dentures or bridgework may not be worn into surgery.   Bring small overnight bag day of surgery.    Patients discharged on the day of surgery will not be allowed to drive home.   Special Instructions: Bring a copy of your healthcare power of attorney and living will documents          the day of surgery if you haven't scanned them before.              Please read over the following fact sheets you were given: IF YOU HAVE QUESTIONS ABOUT YOUR PRE-OP INSTRUCTIONS PLEASE CALL [facility] - Preparing for Surgery Before surgery, you can play an important role.  Because skin is not sterile, your skin needs to be as free of germs as possible.  You can reduce the number of germs on your skin by washing with CHG (chlorahexidine gluconate) soap before surgery.  CHG is an antiseptic cleaner which kills germs and bonds with the skin to continue killing germs even after washing. Please DO NOT use if you have an allergy to CHG or antibacterial soaps.  If your skin becomes reddened/irritated stop using the CHG and inform your nurse when you arrive at Short Stay. Do not shave (including legs and underarms) for at least 48 hours prior to the first CHG shower.  You may shave your face/neck.  Please follow these instructions carefully:  1.  Shower with CHG Soap the night before surgery and  the  morning of surgery.  2.  If you choose to wash your hair, wash your hair first as usual with your normal  shampoo.  3.  After you shampoo, rinse your hair and body thoroughly to remove the shampoo.                             4.  Use CHG as you would any other liquid soap.  You can apply chg directly to the skin and wash.  Gently with a scrungie or clean washcloth.  5.  Apply the CHG Soap to your body ONLY FROM THE NECK DOWN.   Do   not use on face/ open                           Wound or open sores. Avoid contact with eyes, ears mouth and   genitals (private parts).                       Wash face,  Genitals (private parts) with your normal soap.             6.  Wash thoroughly, paying special attention to the area where your    surgery  will be performed.  7.  Thoroughly rinse your body with warm water from the neck down.  8.  DO NOT shower/wash with your normal soap after  using and rinsing off the CHG Soap.                9.  Pat yourself dry with a clean towel.            10.  Wear clean pajamas.            11.  Place clean sheets on your bed the night of your first shower and do not  sleep with pets. Day of Surgery : Do not apply any lotions/deodorants the morning of surgery.  Please wear clean clothes to the hospital/surgery center.  FAILURE TO FOLLOW THESE INSTRUCTIONS MAY RESULT IN THE CANCELLATION OF YOUR SURGERY  PATIENT SIGNATURE_________________________________  NURSE SIGNATURE__________________________________  ________________________________________________________________________

## 2021-08-28 NOTE — Progress Notes (Signed)
Please place orders for PAT appointment scheduled 09/01/21.

## 2021-08-28 NOTE — Progress Notes (Addendum)
COVID swab appointment: 09/04/21 @ 1215  COVID Vaccine Completed: yes x5 Date COVID Vaccine completed: Has received booster: COVID vaccine manufacturer: [place]   Date of COVID positive in last 90 days: no  PCP - Lavone Orn, MD Cardiologist - n/a  Chest x-ray - 07/02/21 Epic EKG - 09/01/21 Epic/chart Stress Test - 2016 ECHO - n/a Cardiac Cath - n/a Pacemaker/ICD device last checked: n/a Spinal Cord Stimulator: n/a  Sleep Study - n/a CPAP -   Fasting Blood Sugar - n/a Checks Blood Sugar _____ times a day  Blood Thinner Instructions: n/a Aspirin Instructions: Last Dose:  Activity level: Can go up a flight of stairs and perform activities of daily living without stopping and without symptoms of chest pain or shortness of breath.    Anesthesia review:   Patient denies shortness of breath, fever, cough and chest pain at PAT appointment   Patient verbalized understanding of instructions that were given to them at the PAT appointment. Patient was also instructed that they will need to review over the PAT instructions again at home before surgery.

## 2021-08-29 DIAGNOSIS — M8580 Other specified disorders of bone density and structure, unspecified site: Secondary | ICD-10-CM | POA: Diagnosis not present

## 2021-08-29 DIAGNOSIS — I10 Essential (primary) hypertension: Secondary | ICD-10-CM | POA: Diagnosis not present

## 2021-08-29 DIAGNOSIS — K219 Gastro-esophageal reflux disease without esophagitis: Secondary | ICD-10-CM | POA: Diagnosis not present

## 2021-08-29 DIAGNOSIS — E7800 Pure hypercholesterolemia, unspecified: Secondary | ICD-10-CM | POA: Diagnosis not present

## 2021-08-29 DIAGNOSIS — M170 Bilateral primary osteoarthritis of knee: Secondary | ICD-10-CM | POA: Diagnosis not present

## 2021-09-01 ENCOUNTER — Encounter (HOSPITAL_COMMUNITY)
Admission: RE | Admit: 2021-09-01 | Discharge: 2021-09-01 | Disposition: A | Discharge status: Home / Self Care | Payer: Medicare HMO | Source: Ambulatory Visit | Attending: General Surgery | Admitting: General Surgery

## 2021-09-01 ENCOUNTER — Other Ambulatory Visit: Payer: Self-pay

## 2021-09-01 ENCOUNTER — Encounter (HOSPITAL_COMMUNITY): Payer: Self-pay

## 2021-09-01 VITALS — BP 143/70 | HR 63 | Temp 98.2°F | Resp 16 | Ht 63.5 in | Wt 135.8 lb

## 2021-09-01 DIAGNOSIS — I2510 Atherosclerotic heart disease of native coronary artery without angina pectoris: Secondary | ICD-10-CM | POA: Diagnosis not present

## 2021-09-01 DIAGNOSIS — Z01818 Encounter for other preprocedural examination: Secondary | ICD-10-CM | POA: Insufficient documentation

## 2021-09-01 LAB — BASIC METABOLIC PANEL
Anion gap: 7 (ref 5–15)
BUN: 17 mg/dL (ref 8–23)
CO2: 31 mmol/L (ref 22–32)
Calcium: 9.6 mg/dL (ref 8.9–10.3)
Chloride: 99 mmol/L (ref 98–111)
Creatinine, Ser: 0.88 mg/dL (ref 0.44–1.00)
GFR, Estimated: >60 mL/min (ref >60)
Glucose, Bld: 90 mg/dL (ref 70–99)
Potassium: 3.8 mmol/L (ref 3.5–5.1)
Sodium: 137 mmol/L (ref 135–145)

## 2021-09-01 LAB — CBC
HCT: 40.8 % (ref 36.0–46.0)
Hemoglobin: 13.8 g/dL (ref 12.0–15.0)
MCH: 31 pg (ref 26.0–34.0)
MCHC: 33.8 g/dL (ref 30.0–36.0)
MCV: 91.7 fL (ref 80.0–100.0)
Platelets: 264 10*3/uL (ref 150–400)
RBC: 4.45 MIL/uL (ref 3.87–5.11)
RDW: 13.9 % (ref 11.5–15.5)
WBC: 7.5 10*3/uL (ref 4.0–10.5)
nRBC: 0 % (ref 0.0–0.2)

## 2021-09-02 ENCOUNTER — Ambulatory Visit: Payer: Self-pay | Admitting: General Surgery

## 2021-09-04 ENCOUNTER — Other Ambulatory Visit: Payer: Self-pay

## 2021-09-04 ENCOUNTER — Encounter (HOSPITAL_COMMUNITY)
Admission: RE | Admit: 2021-09-04 | Discharge: 2021-09-04 | Disposition: A | Discharge status: Home / Self Care | Payer: Medicare HMO | Source: Ambulatory Visit | Attending: General Surgery | Admitting: General Surgery

## 2021-09-04 DIAGNOSIS — Z01818 Encounter for other preprocedural examination: Secondary | ICD-10-CM

## 2021-09-04 DIAGNOSIS — Z01812 Encounter for preprocedural laboratory examination: Secondary | ICD-10-CM | POA: Insufficient documentation

## 2021-09-04 DIAGNOSIS — Z20822 Contact with and (suspected) exposure to covid-19: Secondary | ICD-10-CM | POA: Diagnosis not present

## 2021-09-04 LAB — SARS CORONAVIRUS 2 BY RT PCR (HOSPITAL ORDER, PERFORMED IN ~~LOC~~ HOSPITAL LAB): SARS Coronavirus 2: NEGATIVE

## 2021-09-08 ENCOUNTER — Encounter (HOSPITAL_COMMUNITY)
Admission: RE | Disposition: A | Discharge status: Home / Self Care | Payer: Self-pay | Source: Ambulatory Visit | Attending: General Surgery

## 2021-09-08 ENCOUNTER — Other Ambulatory Visit: Payer: Self-pay

## 2021-09-08 ENCOUNTER — Ambulatory Visit (HOSPITAL_COMMUNITY): Payer: Medicare HMO | Admitting: Certified Registered"

## 2021-09-08 ENCOUNTER — Inpatient Hospital Stay (HOSPITAL_COMMUNITY)
Admission: RE | Admit: 2021-09-08 | Discharge: 2021-09-10 | DRG: 328 | Disposition: A | Discharge status: Home / Self Care | Payer: Medicare HMO | Source: Ambulatory Visit | Attending: General Surgery | Admitting: General Surgery

## 2021-09-08 ENCOUNTER — Encounter (HOSPITAL_COMMUNITY): Payer: Self-pay | Admitting: General Surgery

## 2021-09-08 DIAGNOSIS — E876 Hypokalemia: Secondary | ICD-10-CM | POA: Diagnosis present

## 2021-09-08 DIAGNOSIS — M1990 Unspecified osteoarthritis, unspecified site: Secondary | ICD-10-CM | POA: Diagnosis present

## 2021-09-08 DIAGNOSIS — K219 Gastro-esophageal reflux disease without esophagitis: Secondary | ICD-10-CM | POA: Diagnosis present

## 2021-09-08 DIAGNOSIS — R69 Illness, unspecified: Secondary | ICD-10-CM | POA: Diagnosis not present

## 2021-09-08 DIAGNOSIS — F17210 Nicotine dependence, cigarettes, uncomplicated: Secondary | ICD-10-CM | POA: Diagnosis present

## 2021-09-08 DIAGNOSIS — Z96651 Presence of right artificial knee joint: Secondary | ICD-10-CM | POA: Diagnosis present

## 2021-09-08 DIAGNOSIS — R110 Nausea: Secondary | ICD-10-CM | POA: Diagnosis not present

## 2021-09-08 DIAGNOSIS — K449 Diaphragmatic hernia without obstruction or gangrene: Principal | ICD-10-CM

## 2021-09-08 DIAGNOSIS — Z79899 Other long term (current) drug therapy: Secondary | ICD-10-CM

## 2021-09-08 HISTORY — PX: HIATAL HERNIA REPAIR: SHX195

## 2021-09-08 HISTORY — PX: LAPAROSCOPIC INSERTION GASTROSTOMY TUBE: SHX6817

## 2021-09-08 LAB — CBC
HCT: 40.1 % (ref 36.0–46.0)
Hemoglobin: 13.3 g/dL (ref 12.0–15.0)
MCH: 31 pg (ref 26.0–34.0)
MCHC: 33.2 g/dL (ref 30.0–36.0)
MCV: 93.5 fL (ref 80.0–100.0)
Platelets: 260 10*3/uL (ref 150–400)
RBC: 4.29 MIL/uL (ref 3.87–5.11)
RDW: 14.3 % (ref 11.5–15.5)
WBC: 15 10*3/uL — ABNORMAL HIGH (ref 4.0–10.5)
nRBC: 0 % (ref 0.0–0.2)

## 2021-09-08 LAB — CREATININE, SERUM
Creatinine, Ser: 0.79 mg/dL (ref 0.44–1.00)
GFR, Estimated: >60 mL/min (ref >60)

## 2021-09-08 SURGERY — REPAIR, HERNIA, HIATAL, LAPAROSCOPIC
Anesthesia: General

## 2021-09-08 MED ORDER — LACTATED RINGERS IV SOLN: INTRAVENOUS | Status: DC

## 2021-09-08 MED ORDER — EPHEDRINE SULFATE-NACL 50-0.9 MG/10ML-% IV SOSY
PREFILLED_SYRINGE | INTRAVENOUS | Status: DC | PRN
  Administered 2021-09-08 (×2): 10 mg via INTRAVENOUS

## 2021-09-08 MED ORDER — ROCURONIUM BROMIDE 10 MG/ML (PF) SYRINGE
PREFILLED_SYRINGE | INTRAVENOUS | Status: AC
  Filled 2021-09-08: qty 10

## 2021-09-08 MED ORDER — ONDANSETRON HCL 4 MG/2ML IJ SOLN
4.0000 mg | Freq: Four times a day (QID) | INTRAMUSCULAR | Status: DC | PRN
  Administered 2021-09-08 – 2021-09-09 (×4): 4 mg via INTRAVENOUS
  Filled 2021-09-08 (×4): qty 2

## 2021-09-08 MED ORDER — DEXAMETHASONE SODIUM PHOSPHATE 10 MG/ML IJ SOLN
INTRAMUSCULAR | Status: AC
  Filled 2021-09-08: qty 1

## 2021-09-08 MED ORDER — EPHEDRINE 5 MG/ML INJ
INTRAVENOUS | Status: AC
  Filled 2021-09-08: qty 5

## 2021-09-08 MED ORDER — METOPROLOL TARTRATE 5 MG/5ML IV SOLN: 5.0000 mg | Freq: Four times a day (QID) | INTRAVENOUS | Status: DC | PRN

## 2021-09-08 MED ORDER — DIPHENHYDRAMINE HCL 12.5 MG/5ML PO ELIX: 12.5000 mg | ORAL_SOLUTION | Freq: Four times a day (QID) | ORAL | Status: DC | PRN

## 2021-09-08 MED ORDER — ACETAMINOPHEN 500 MG PO TABS
1000.0000 mg | ORAL_TABLET | Freq: Four times a day (QID) | ORAL | Status: DC
  Administered 2021-09-08 – 2021-09-10 (×7): 1000 mg via ORAL
  Filled 2021-09-08 (×8): qty 2

## 2021-09-08 MED ORDER — SUGAMMADEX SODIUM 200 MG/2ML IV SOLN
INTRAVENOUS | Status: DC | PRN
  Administered 2021-09-08: 150 mg via INTRAVENOUS

## 2021-09-08 MED ORDER — ENOXAPARIN SODIUM 40 MG/0.4ML IJ SOSY
40.0000 mg | PREFILLED_SYRINGE | INTRAMUSCULAR | Status: DC
Start: 2021-09-09 — End: 2021-09-10
  Administered 2021-09-09 – 2021-09-10 (×2): 40 mg via SUBCUTANEOUS
  Filled 2021-09-08 (×2): qty 0.4

## 2021-09-08 MED ORDER — HYDROMORPHONE HCL 1 MG/ML IJ SOLN
0.2500 mg | INTRAMUSCULAR | Status: DC | PRN
  Administered 2021-09-08: 0.5 mg via INTRAVENOUS
  Administered 2021-09-08: 0.25 mg via INTRAVENOUS

## 2021-09-08 MED ORDER — ORAL CARE MOUTH RINSE: 15.0000 mL | Freq: Once | OROMUCOSAL | Status: AC

## 2021-09-08 MED ORDER — CHLORHEXIDINE GLUCONATE CLOTH 2 % EX PADS: 6.0000 | MEDICATED_PAD | Freq: Once | CUTANEOUS | Status: DC

## 2021-09-08 MED ORDER — KETOROLAC TROMETHAMINE 15 MG/ML IJ SOLN
15.0000 mg | Freq: Four times a day (QID) | INTRAMUSCULAR | Status: DC | PRN
  Administered 2021-09-08: 15 mg via INTRAVENOUS
  Filled 2021-09-08: qty 1

## 2021-09-08 MED ORDER — OXYCODONE HCL 5 MG PO TABS
ORAL_TABLET | ORAL | Status: AC
  Filled 2021-09-08: qty 1

## 2021-09-08 MED ORDER — PANTOPRAZOLE SODIUM 40 MG IV SOLR
40.0000 mg | Freq: Every day | INTRAVENOUS | Status: DC
  Administered 2021-09-08 – 2021-09-09 (×2): 40 mg via INTRAVENOUS
  Filled 2021-09-08: qty 40
  Filled 2021-09-08: qty 10

## 2021-09-08 MED ORDER — BUPIVACAINE LIPOSOME 1.3 % IJ SUSP
INTRAMUSCULAR | Status: DC | PRN
  Administered 2021-09-08: 20 mL

## 2021-09-08 MED ORDER — FENTANYL CITRATE (PF) 100 MCG/2ML IJ SOLN
INTRAMUSCULAR | Status: DC | PRN
  Administered 2021-09-08 (×4): 50 ug via INTRAVENOUS

## 2021-09-08 MED ORDER — OXYCODONE HCL 5 MG PO TABS
5.0000 mg | ORAL_TABLET | ORAL | Status: DC | PRN
  Administered 2021-09-08: 5 mg via ORAL
  Administered 2021-09-08: 10 mg via ORAL
  Filled 2021-09-08: qty 2

## 2021-09-08 MED ORDER — PROPOFOL 10 MG/ML IV BOLUS
INTRAVENOUS | Status: DC | PRN
  Administered 2021-09-08: 120 mg via INTRAVENOUS

## 2021-09-08 MED ORDER — OXYCODONE HCL 5 MG PO TABS: 5.0000 mg | ORAL_TABLET | Freq: Once | ORAL | Status: DC | PRN

## 2021-09-08 MED ORDER — PROMETHAZINE HCL 25 MG/ML IJ SOLN
6.2500 mg | INTRAMUSCULAR | Status: DC | PRN
  Administered 2021-09-08: 6.25 mg via INTRAVENOUS

## 2021-09-08 MED ORDER — BUPIVACAINE-EPINEPHRINE 0.25% -1:200000 IJ SOLN
INTRAMUSCULAR | Status: DC | PRN
  Administered 2021-09-08: 30 mL

## 2021-09-08 MED ORDER — ONDANSETRON 4 MG PO TBDP: 4.0000 mg | ORAL_TABLET | Freq: Four times a day (QID) | ORAL | Status: DC | PRN

## 2021-09-08 MED ORDER — DEXAMETHASONE SODIUM PHOSPHATE 10 MG/ML IJ SOLN
INTRAMUSCULAR | Status: DC | PRN
  Administered 2021-09-08: 4 mg via INTRAVENOUS

## 2021-09-08 MED ORDER — PROPOFOL 10 MG/ML IV BOLUS
INTRAVENOUS | Status: AC
  Filled 2021-09-08: qty 20

## 2021-09-08 MED ORDER — BUPIVACAINE LIPOSOME 1.3 % IJ SUSP: 20.0000 mL | Freq: Once | INTRAMUSCULAR | Status: DC

## 2021-09-08 MED ORDER — ACETAMINOPHEN 500 MG PO TABS
1000.0000 mg | ORAL_TABLET | ORAL | Status: AC
  Administered 2021-09-08: 1000 mg via ORAL

## 2021-09-08 MED ORDER — 0.9 % SODIUM CHLORIDE (POUR BTL) OPTIME
TOPICAL | Status: DC | PRN
  Administered 2021-09-08: 1000 mL

## 2021-09-08 MED ORDER — HYDROMORPHONE HCL 1 MG/ML IJ SOLN
INTRAMUSCULAR | Status: AC
  Filled 2021-09-08: qty 1

## 2021-09-08 MED ORDER — ENSURE PRE-SURGERY PO LIQD: 296.0000 mL | Freq: Once | ORAL | Status: DC

## 2021-09-08 MED ORDER — BUPIVACAINE LIPOSOME 1.3 % IJ SUSP
INTRAMUSCULAR | Status: AC
  Filled 2021-09-08: qty 40

## 2021-09-08 MED ORDER — LIDOCAINE HCL (PF) 2 % IJ SOLN
INTRAMUSCULAR | Status: DC | PRN
  Administered 2021-09-08: 1.5 mg/kg/h via INTRADERMAL

## 2021-09-08 MED ORDER — ROCURONIUM BROMIDE 10 MG/ML (PF) SYRINGE
PREFILLED_SYRINGE | INTRAVENOUS | Status: DC | PRN
  Administered 2021-09-08: 60 mg via INTRAVENOUS
  Administered 2021-09-08: 40 mg via INTRAVENOUS
  Administered 2021-09-08: 10 mg via INTRAVENOUS

## 2021-09-08 MED ORDER — BUPIVACAINE-EPINEPHRINE (PF) 0.25% -1:200000 IJ SOLN
INTRAMUSCULAR | Status: AC
  Filled 2021-09-08: qty 120

## 2021-09-08 MED ORDER — ONDANSETRON HCL 4 MG/2ML IJ SOLN
INTRAMUSCULAR | Status: AC
  Filled 2021-09-08: qty 2

## 2021-09-08 MED ORDER — AMISULPRIDE (ANTIEMETIC) 5 MG/2ML IV SOLN: 10.0000 mg | Freq: Once | INTRAVENOUS | Status: DC | PRN

## 2021-09-08 MED ORDER — CHLORHEXIDINE GLUCONATE 0.12 % MT SOLN
15.0000 mL | Freq: Once | OROMUCOSAL | Status: AC
  Administered 2021-09-08: 15 mL via OROMUCOSAL

## 2021-09-08 MED ORDER — LIDOCAINE HCL (PF) 2 % IJ SOLN
INTRAMUSCULAR | Status: AC
  Filled 2021-09-08: qty 10

## 2021-09-08 MED ORDER — CEFAZOLIN SODIUM-DEXTROSE 2-4 GM/100ML-% IV SOLN
2.0000 g | INTRAVENOUS | Status: AC
  Administered 2021-09-08: 2 g via INTRAVENOUS

## 2021-09-08 MED ORDER — LIDOCAINE HCL (PF) 2 % IJ SOLN
INTRAMUSCULAR | Status: AC
  Filled 2021-09-08: qty 5

## 2021-09-08 MED ORDER — OXYBUTYNIN CHLORIDE ER 5 MG PO TB24
10.0000 mg | ORAL_TABLET | Freq: Every day | ORAL | Status: DC
Start: 2021-09-08 — End: 2021-09-10
  Administered 2021-09-08 – 2021-09-09 (×2): 10 mg via ORAL
  Filled 2021-09-08 (×2): qty 2

## 2021-09-08 MED ORDER — ONDANSETRON HCL 4 MG/2ML IJ SOLN
INTRAMUSCULAR | Status: DC | PRN
  Administered 2021-09-08: 4 mg via INTRAVENOUS

## 2021-09-08 MED ORDER — HYDROMORPHONE HCL 1 MG/ML IJ SOLN: 0.5000 mg | INTRAMUSCULAR | Status: DC | PRN

## 2021-09-08 MED ORDER — DEXTROSE-NACL 5-0.45 % IV SOLN: INTRAVENOUS | Status: DC

## 2021-09-08 MED ORDER — DIPHENHYDRAMINE HCL 50 MG/ML IJ SOLN: 12.5000 mg | Freq: Four times a day (QID) | INTRAMUSCULAR | Status: DC | PRN

## 2021-09-08 MED ORDER — LACTATED RINGERS IR SOLN
Status: DC | PRN
  Administered 2021-09-08: 1000 mL

## 2021-09-08 MED ORDER — FENTANYL CITRATE (PF) 100 MCG/2ML IJ SOLN
INTRAMUSCULAR | Status: AC
  Filled 2021-09-08: qty 2

## 2021-09-08 MED ORDER — NITROGLYCERIN 0.4 MG SL SUBL: 0.4000 mg | SUBLINGUAL_TABLET | SUBLINGUAL | Status: DC | PRN

## 2021-09-08 MED ORDER — CELECOXIB 200 MG PO CAPS
400.0000 mg | ORAL_CAPSULE | ORAL | Status: AC
  Administered 2021-09-08: 400 mg via ORAL

## 2021-09-08 MED ORDER — OXYCODONE HCL 5 MG/5ML PO SOLN: 5.0000 mg | Freq: Once | ORAL | Status: DC | PRN

## 2021-09-08 MED ORDER — PROMETHAZINE HCL 25 MG/ML IJ SOLN
INTRAMUSCULAR | Status: AC
  Filled 2021-09-08: qty 1

## 2021-09-08 MED ORDER — LIDOCAINE 2% (20 MG/ML) 5 ML SYRINGE
INTRAMUSCULAR | Status: DC | PRN
  Administered 2021-09-08: 60 mg via INTRAVENOUS

## 2021-09-08 SURGICAL SUPPLY — 63 items
APPLICATOR COTTON TIP 6 STRL (MISCELLANEOUS) IMPLANT
APPLICATOR COTTON TIP 6IN STRL (MISCELLANEOUS) ×2
APPLIER CLIP 5 13 M/L LIGAMAX5 (MISCELLANEOUS)
BAG COUNTER SPONGE SURGICOUNT (BAG) IMPLANT
BAG URINE LEG 500ML (DRAIN) ×1 IMPLANT
BENZOIN TINCTURE PRP APPL 2/3 (GAUZE/BANDAGES/DRESSINGS) IMPLANT
BNDG ADH 1X3 SHEER STRL LF (GAUZE/BANDAGES/DRESSINGS) ×4 IMPLANT
CABLE HIGH FREQUENCY MONO STRZ (ELECTRODE) IMPLANT
CATH BOLUS GASTRO 22FR (CATHETERS) IMPLANT
CHLORAPREP W/TINT 26 (MISCELLANEOUS) ×2 IMPLANT
CLIP APPLIE 5 13 M/L LIGAMAX5 (MISCELLANEOUS) IMPLANT
COVER SURGICAL LIGHT HANDLE (MISCELLANEOUS) ×2 IMPLANT
DERMABOND ADVANCED (GAUZE/BANDAGES/DRESSINGS) ×1
DERMABOND ADVANCED .7 DNX12 (GAUZE/BANDAGES/DRESSINGS) IMPLANT
DRAIN PENROSE 0.5X18 (DRAIN) ×2 IMPLANT
ELECT L-HOOK LAP 45CM DISP (ELECTROSURGICAL) ×2
ELECT REM PT RETURN 15FT ADLT (MISCELLANEOUS) ×2 IMPLANT
ELECTRODE L-HOOK LAP 45CM DISP (ELECTROSURGICAL) ×1 IMPLANT
GLOVE SURG POLYISO LF SZ7 (GLOVE) ×2 IMPLANT
GLOVE SURG UNDER POLY LF SZ7 (GLOVE) ×2 IMPLANT
GOWN STRL REUS W/TWL LRG LVL3 (GOWN DISPOSABLE) ×2 IMPLANT
GOWN STRL REUS W/TWL XL LVL3 (GOWN DISPOSABLE) ×4 IMPLANT
GRASPER SUT TROCAR 14GX15 (MISCELLANEOUS) ×1 IMPLANT
IRRIG SUCT STRYKERFLOW 2 WTIP (MISCELLANEOUS) ×2
IRRIGATION SUCT STRKRFLW 2 WTP (MISCELLANEOUS) ×1 IMPLANT
KIT BASIN OR (CUSTOM PROCEDURE TRAY) ×2 IMPLANT
KIT TURNOVER KIT A (KITS) IMPLANT
LUBRICANT JELLY K Y 4OZ (MISCELLANEOUS) IMPLANT
MARKER SKIN DUAL TIP RULER LAB (MISCELLANEOUS) ×2 IMPLANT
MESH PHASIX RESORB RECT 10X15 (Mesh General) ×1 IMPLANT
NS IRRIG 1000ML POUR BTL (IV SOLUTION) ×2 IMPLANT
PACK CARDIOVASCULAR III (CUSTOM PROCEDURE TRAY) ×2 IMPLANT
PAD POSITIONING PINK XL (MISCELLANEOUS) ×2 IMPLANT
PENCIL SMOKE EVACUATOR (MISCELLANEOUS) IMPLANT
SCISSORS LAP 5X35 DISP (ENDOMECHANICALS) IMPLANT
SCISSORS LAP 5X45 EPIX DISP (ENDOMECHANICALS) ×2 IMPLANT
SET IRRIG Y TYPE TUR BLADDER L (SET/KITS/TRAYS/PACK) IMPLANT
SET TUBE SMOKE EVAC HIGH FLOW (TUBING) ×2 IMPLANT
SHEARS HARMONIC ACE PLUS 45CM (MISCELLANEOUS) ×2 IMPLANT
SLEEVE ENDOPATH XCEL 5M (ENDOMECHANICALS) ×4 IMPLANT
SLEEVE XCEL OPT CAN 5 100 (ENDOMECHANICALS) ×2 IMPLANT
SOL ANTI FOG 6CC (MISCELLANEOUS) ×1 IMPLANT
SOLUTION ANTI FOG 6CC (MISCELLANEOUS) ×1
SPIKE FLUID TRANSFER (MISCELLANEOUS) ×1 IMPLANT
SPONGE DRAIN TRACH 4X4 STRL 2S (GAUZE/BANDAGES/DRESSINGS) ×1 IMPLANT
STRIP CLOSURE SKIN 1/2X4 (GAUZE/BANDAGES/DRESSINGS) IMPLANT
SUT ETHIBOND 0 36 GRN (SUTURE) ×4 IMPLANT
SUT ETHILON 2 0 PS N (SUTURE) ×1 IMPLANT
SUT MNCRL AB 4-0 PS2 18 (SUTURE) ×2 IMPLANT
SUT SILK 0 SH 30 (SUTURE) ×2 IMPLANT
SUT SILK 2 0 SH (SUTURE) ×6 IMPLANT
SUT VICRYL 0 UR6 27IN ABS (SUTURE) ×1 IMPLANT
TAPE CLOTH 4X10 WHT NS (GAUZE/BANDAGES/DRESSINGS) ×2 IMPLANT
TIP INNERVISION DETACH 56FR (MISCELLANEOUS) ×1 IMPLANT
TOWEL OR 17X26 10 PK STRL BLUE (TOWEL DISPOSABLE) ×4 IMPLANT
TOWEL OR NON WOVEN STRL DISP B (DISPOSABLE) ×2 IMPLANT
TRAY FOLEY MTR SLVR 14FR STAT (SET/KITS/TRAYS/PACK) ×1 IMPLANT
TRAY FOLEY MTR SLVR 16FR STAT (SET/KITS/TRAYS/PACK) ×1 IMPLANT
TRAY LAPAROSCOPIC (CUSTOM PROCEDURE TRAY) ×2 IMPLANT
TROCAR BLADELESS OPT 5 100 (ENDOMECHANICALS) ×2 IMPLANT
TROCAR XCEL 12X100 BLDLESS (ENDOMECHANICALS) IMPLANT
TROCAR XCEL NON-BLD 5MMX100MML (ENDOMECHANICALS) ×2 IMPLANT
TUBE GASTROSTOMY 18F (CATHETERS) ×1 IMPLANT

## 2021-09-08 NOTE — H&P (Signed)
Chief Complaint: No chief complaint on file.   History of Present Illness: Julie Day is a 76 y.o. female who is seen today for worsening reflux.  She continues to have worsening reflux symptoms. She has daily pain burning in her chest. Antacid or sucralfate are not helping the pain. She denies regurgitation or dysphagia. She continues to smoke half a pack per day   Review of Systems: A complete review of systems was obtained from the patient. I have reviewed this information and discussed as appropriate with the patient. See HPI as well for other ROS.  Review of Systems  Constitutional: Negative.  HENT: Negative.  Eyes: Negative.  Respiratory: Negative.  Cardiovascular: Negative.  Gastrointestinal: Positive for heartburn.  Genitourinary: Negative.  Musculoskeletal: Negative.  Skin: Negative.  Neurological: Negative.  Endo/Heme/Allergies: Negative.  Psychiatric/Behavioral: Negative.    Medical History: Past Medical History:  Diagnosis Date   Arthritis   GERD (gastroesophageal reflux disease)   There is no problem list on file for this patient.  Past Surgical History:  Procedure Laterality Date   REPLACEMENT TOTAL KNEE 10/2019    Not on File  Current Outpatient Medications on File Prior to Visit  Medication Sig Dispense Refill   pantoprazole (PROTONIX) 40 MG DR tablet 1 tablet   sucralfate (CARAFATE) 1 gram tablet 1 tablet on an empty stomach   acetaminophen (TYLENOL ARTHRITIS PAIN) 650 MG ER tablet 2 tablets as needed   cyanocobalamin (VITAMIN B12) 1000 MCG tablet Take by mouth   melatonin 5 mg Tab 5 mg nightly.   No current facility-administered medications on file prior to visit.   History reviewed. No pertinent family history.   Social History   Tobacco Use  Smoking Status Not on file  Smokeless Tobacco Not on file    Social History   Socioeconomic History   Marital status: Married   Objective:   There were no vitals filed for this visit.   There is no height or weight on file to calculate BMI.  Physical Exam Constitutional:  Appearance: Normal appearance.  HENT:  Head: Normocephalic and atraumatic.  Pulmonary:  Effort: Pulmonary effort is normal.  Musculoskeletal:  General: Normal range of motion.  Cervical back: Normal range of motion.  Neurological:  General: No focal deficit present.  Mental Status: She is alert and oriented to person, place, and time. Mental status is at baseline.  Psychiatric:  Mood and Affect: Mood normal.  Behavior: Behavior normal.  Thought Content: Thought content normal.   Labs, Imaging and Diagnostic Testing:  I reviewed UGI from 2022  Assessment and Plan:   Diagnoses and all orders for this visit:  Paraesophageal hernia  S/P Nissen fundoplication (without gastrostomy tube) procedure    The anatomy & physiology of the foregut and anti-reflux mechanism was discussed. The pathophysiology of GERD was discussed. Natural history risks without surgery was discussed. The patient's symptoms are not adequately controlled by medicines and other non-operative treatments. I feel the risks of no intervention will lead to serious problems that outweigh the operative risks; therefore, I recommended surgery to rebuild the anti-reflux valve and control reflux better. Need for a thorough workup to rule out the differential diagnosis and plan treatment was explained. I explained laparoscopic techniques.  Risks such as bleeding, infection, abscess, leak, injury to other organs, need for repair of tissues / organs, need for further treatment, stroke, heart attack, death, and other risks were discussed. I noted a good likelihood this will help address the problem. Goals of post-operative  recovery were discussed as well. Possibility that this will not correct all symptoms was explained. Post-operative dysphagia, need for short-term liquid & pureed diet, inability to vomit, possibility of wrap slippage or  herniation, possible need for medicines to help control symptoms in addition to surgery were discussed. We will work to minimize complications. Questions were answered. The patient expresses understanding & wishes to proceed with surgery.

## 2021-09-08 NOTE — Op Note (Signed)
Preoperative diagnosis: recurrent hiatal hernia  Postoperative diagnosis: same   Procedure: laparoscopic paraesophageal hernia repair, laparoscopic Nissen fundoplication, insertion of mesh, insertion of gastrostomy tube  Surgeon: Gurney Maxin, M.D.  Asst: Romana Juniper, M.D.  Anesthesia: general  Indications for procedure: Julie Day is a 76 y.o. year old female with symptoms of worsening reflux. She had a Nissen in 2016 by Dr. Dalbert Batman. Work up revealed a recurrence of hiatal hernia. She presents for repair.Marland Kitchen  Description of procedure: The patient was brought into the operative suite. Anesthesia was administered with General endotracheal anesthesia. WHO checklist was applied. The patient was then placed in supine position. The area was prepped and draped in the usual sterile fashion.  Next, a left subcostal incision was made. A 30mm trocar was used to gain access to the peritoneal cavity by optical entry technique. Pneumoperitoneum was applied with a high flow and low pressure. The laparoscope was reinserted to confirm position. A 5 mm trocar was placed in the left periumbilical space. Bilateral TAP blocks were placed with Marcaine/Exparel mix. 1 5 mm trocar was placed in the right subcostal area. 1 12 mm trocar was placed in the right mid abdominal space. 1 5 mm trocar was placed in the left lateral space. A Nathanson retractor was placed in the subxiphoid space and used to retract the left lobe of the liver.  There was a moderate amount of scar to the liver. This was slowly taken down with harmonic scalpel. The hiatal hernia was identified and was already reduced. The previous Nissen was identified and sutures and scar divided with scissors. The esophagus was completely freed from surrounding attachments. Care was taken to avoid injury to the vagus nerves. The sac was divided from the GE junction and removed.  A 56 Fr bougie was passed into the Esophagus and stomach. The crus was repaired  with 1 interrupted 2-0 ethibond sutures showing appropriate sizing of the crus around the bougie.  A Phasix mesh was cut to appropriate size in a U shape and placed posterior to the esophagus. It was sutured to the Diaphragm with 3 2-0 silks.   A Nissen fundoplication was created by bringing the fundus posterior to the esophagus and 3 2-0 silks were used to suture the esophagus to the fundus on the right side and left sides. This was done over bougie.  The bougie was removed. A portion of the distal body of the stomach was chosen for gastrostomy tube. 2 0 silks were placed into the stomach and brought through the abdominal wall with suture passers. Next, a 2-0 silk was used to make a pursestring between the transfascial silks. A gastrotomy was made and a 18 fr g tube inserted via the left subcostal incision and into the gastrotomy. 5 cc of saline was injected into the balloon. The pursestring was tied down around the tube. The transfascial silks and tube were used to appose the stomach to the abdominal wall and silks tied down. The g tube was sutured to the skin with 2-0 nylon. The 12 mm trocar was removed and 0 vicryl used to close the fascia with suture passer.  Hemostasis was inspected and intact. Pneumoperitoneum was removed. All trocars were removed. All incisions were closed with 4-0 monocryl subcuticular suture. Dermabond was placed for dressing. The patient awoke from anesthesia and was brought to pacu in stable condition.   Findings: moderate recurrent hiatal hernia  Specimen: none  Implant: 18 fr g tube   Blood loss: 20  Local anesthesia:  50 ml Exparel:Marcaine Mix  Complications: none  Gurney Maxin, M.D. General, Bariatric, & Minimally Invasive Surgery [facility]

## 2021-09-08 NOTE — Transfer of Care (Signed)
Immediate Anesthesia Transfer of Care Note  Patient: Julie Day  Procedure(s) Performed: LAPAROSCOPIC REVISION HIATAL HERNIA REPAIR WITH FUNDOPLICATION AND MESH LAPAROSCOPIC INSERTION GASTROSTOMY TUBE  Patient Location: PACU  Anesthesia Type:General  Level of Consciousness: awake, alert  and oriented  Airway & Oxygen Therapy: Patient Spontanous Breathing and Patient connected to face mask oxygen  Post-op Assessment: Report given to RN, Post -op Vital signs reviewed and stable and Patient moving all extremities X 4  Post vital signs: Reviewed and stable  Last Vitals:  Vitals Value Taken Time  BP 111/76   Temp    Pulse 72   Resp 19 09/08/21 1009  SpO2 100   Vitals shown include unvalidated device data.  Last Pain:  Vitals:   09/08/21 0549  TempSrc:   PainSc: 0-No pain      Patients Stated Pain Goal: 4 (99/35/70 1779)  Complications: No notable events documented.

## 2021-09-08 NOTE — Anesthesia Procedure Notes (Signed)
Procedure Name: Intubation Date/Time: 09/08/2021 7:29 AM Performed by: Niel Hummer, CRNA Pre-anesthesia Checklist: Patient identified, Emergency Drugs available, Suction available and Patient being monitored Patient Re-evaluated:Patient Re-evaluated prior to induction Oxygen Delivery Method: Circle system utilized Preoxygenation: Pre-oxygenation with 100% oxygen Induction Type: IV induction Ventilation: Mask ventilation without difficulty Laryngoscope Size: Mac and 4 Grade View: Grade II Tube type: Oral Tube size: 7.0 mm Number of attempts: 1 Airway Equipment and Method: Stylet Placement Confirmation: ETT inserted through vocal cords under direct vision, positive ETCO2 and breath sounds checked- equal and bilateral Secured at: 23 cm Tube secured with: Tape Dental Injury: Teeth and Oropharynx as per pre-operative assessment

## 2021-09-08 NOTE — Anesthesia Preprocedure Evaluation (Signed)
Anesthesia Evaluation  Patient identified by MRN, date of birth, ID band Patient awake    Reviewed: Allergy & Precautions, NPO status , Patient's Chart, lab work & pertinent test results  Airway Mallampati: II  TM Distance: >3 FB Neck ROM: Full    Dental no notable dental hx. (+) Teeth Intact, Implants, Dental Advisory Given   Pulmonary former smoker,    Pulmonary exam normal breath sounds clear to auscultation       Cardiovascular hypertension, Pt. on medications Normal cardiovascular exam Rhythm:Regular Rate:Normal     Neuro/Psych negative neurological ROS  negative psych ROS   GI/Hepatic Neg liver ROS, hiatal hernia, GERD  ,  Endo/Other    Renal/GU negative Renal ROSK+ 3.9 Cr 0.82     Musculoskeletal  (+) Arthritis , Osteoarthritis,    Abdominal   Peds  Hematology Hgb 13.9   Anesthesia Other Findings   Reproductive/Obstetrics                             Anesthesia Physical  Anesthesia Plan  ASA: II  Anesthesia Plan: General   Post-op Pain Management:  Regional for Post-op pain   Induction: Intravenous  PONV Risk Score and Plan: 3 and Treatment may vary due to age or medical condition, Ondansetron, Dexamethasone and Midazolam  Airway Management Planned: Oral ETT  Additional Equipment: None  Intra-op Plan:   Post-operative Plan: Extubation in OR  Informed Consent: I have reviewed the patients History and Physical, chart, labs and discussed the procedure including the risks, benefits and alternatives for the proposed anesthesia with the patient or authorized representative who has indicated his/her understanding and acceptance.     Dental advisory given  Plan Discussed with:   Anesthesia Plan Comments:         Anesthesia Quick Evaluation

## 2021-09-08 NOTE — Anesthesia Postprocedure Evaluation (Signed)
Anesthesia Post Note  Patient: Julie Day  Procedure(s) Performed: LAPAROSCOPIC REVISION HIATAL HERNIA REPAIR WITH FUNDOPLICATION AND MESH LAPAROSCOPIC INSERTION GASTROSTOMY TUBE     Patient location during evaluation: PACU Anesthesia Type: General Level of consciousness: awake and alert Pain management: pain level controlled Vital Signs Assessment: post-procedure vital signs reviewed and stable Respiratory status: spontaneous breathing, nonlabored ventilation and respiratory function stable Cardiovascular status: blood pressure returned to baseline and stable Postop Assessment: no apparent nausea or vomiting Anesthetic complications: no   No notable events documented.  Last Vitals:  Vitals:   09/08/21 1045 09/08/21 1100  BP: (!) 149/71 (!) 147/83  Pulse: 72 62  Resp: 15 12  Temp:    SpO2: 99% 98%    Last Pain:  Vitals:   09/08/21 1100  TempSrc:   PainSc: Asleep                 Lynda Rainwater

## 2021-09-08 NOTE — Plan of Care (Signed)
°  Problem: Clinical Measurements: Goal: Ability to maintain clinical measurements within normal limits will improve Outcome: Progressing   Problem: Activity: Goal: Risk for activity intolerance will decrease Outcome: Progressing   Problem: Elimination: Goal: Will not experience complications related to bowel motility Outcome: Progressing

## 2021-09-09 ENCOUNTER — Encounter (HOSPITAL_COMMUNITY): Payer: Self-pay | Admitting: General Surgery

## 2021-09-09 DIAGNOSIS — F17210 Nicotine dependence, cigarettes, uncomplicated: Secondary | ICD-10-CM | POA: Diagnosis present

## 2021-09-09 DIAGNOSIS — Z79899 Other long term (current) drug therapy: Secondary | ICD-10-CM | POA: Diagnosis not present

## 2021-09-09 DIAGNOSIS — E876 Hypokalemia: Secondary | ICD-10-CM | POA: Diagnosis not present

## 2021-09-09 DIAGNOSIS — Z96651 Presence of right artificial knee joint: Secondary | ICD-10-CM | POA: Diagnosis not present

## 2021-09-09 DIAGNOSIS — K449 Diaphragmatic hernia without obstruction or gangrene: Secondary | ICD-10-CM | POA: Diagnosis not present

## 2021-09-09 DIAGNOSIS — R69 Illness, unspecified: Secondary | ICD-10-CM | POA: Diagnosis not present

## 2021-09-09 DIAGNOSIS — M1990 Unspecified osteoarthritis, unspecified site: Secondary | ICD-10-CM | POA: Diagnosis not present

## 2021-09-09 DIAGNOSIS — R110 Nausea: Secondary | ICD-10-CM | POA: Diagnosis not present

## 2021-09-09 DIAGNOSIS — K219 Gastro-esophageal reflux disease without esophagitis: Secondary | ICD-10-CM | POA: Diagnosis not present

## 2021-09-09 LAB — BASIC METABOLIC PANEL
Anion gap: 7 (ref 5–15)
BUN: 16 mg/dL (ref 8–23)
CO2: 31 mmol/L (ref 22–32)
Calcium: 9.3 mg/dL (ref 8.9–10.3)
Chloride: 101 mmol/L (ref 98–111)
Creatinine, Ser: 0.88 mg/dL (ref 0.44–1.00)
GFR, Estimated: >60 mL/min (ref >60)
Glucose, Bld: 113 mg/dL — ABNORMAL HIGH (ref 70–99)
Potassium: 3.3 mmol/L — ABNORMAL LOW (ref 3.5–5.1)
Sodium: 139 mmol/L (ref 135–145)

## 2021-09-09 LAB — CBC
HCT: 36.5 % (ref 36.0–46.0)
Hemoglobin: 12.1 g/dL (ref 12.0–15.0)
MCH: 31.1 pg (ref 26.0–34.0)
MCHC: 33.2 g/dL (ref 30.0–36.0)
MCV: 93.8 fL (ref 80.0–100.0)
Platelets: 242 10*3/uL (ref 150–400)
RBC: 3.89 MIL/uL (ref 3.87–5.11)
RDW: 14.2 % (ref 11.5–15.5)
WBC: 10.7 10*3/uL — ABNORMAL HIGH (ref 4.0–10.5)
nRBC: 0 % (ref 0.0–0.2)

## 2021-09-09 MED ORDER — PROCHLORPERAZINE EDISYLATE 10 MG/2ML IJ SOLN
10.0000 mg | Freq: Four times a day (QID) | INTRAMUSCULAR | Status: DC | PRN
  Administered 2021-09-09 (×2): 10 mg via INTRAVENOUS
  Filled 2021-09-09 (×2): qty 2

## 2021-09-09 MED ORDER — KCL IN DEXTROSE-NACL 20-5-0.45 MEQ/L-%-% IV SOLN
INTRAVENOUS | Status: DC
  Filled 2021-09-09 (×2): qty 1000

## 2021-09-09 MED ORDER — POTASSIUM CHLORIDE 10 MEQ/100ML IV SOLN
10.0000 meq | INTRAVENOUS | Status: AC
  Administered 2021-09-09 (×3): 10 meq via INTRAVENOUS
  Filled 2021-09-09 (×3): qty 100

## 2021-09-09 MED ORDER — POTASSIUM CHLORIDE 10 MEQ/100ML IV SOLN
10.0000 meq | INTRAVENOUS | Status: AC
  Administered 2021-09-09 – 2021-09-10 (×3): 10 meq via INTRAVENOUS
  Filled 2021-09-09 (×3): qty 100

## 2021-09-09 NOTE — Plan of Care (Signed)
°  Problem: Clinical Measurements: Goal: Ability to maintain clinical measurements within normal limits will improve Outcome: Progressing   Problem: Activity: Goal: Risk for activity intolerance will decrease Outcome: Progressing   Problem: Nutrition: Goal: Adequate nutrition will be maintained Outcome: Progressing   Problem: Elimination: Goal: Will not experience complications related to bowel motility Outcome: Progressing

## 2021-09-09 NOTE — Plan of Care (Signed)

## 2021-09-09 NOTE — Progress Notes (Signed)
Progress Note: General Surgery Service   Chief Complaint/Subjective: Nausea with any PO intake Large amount of fluid out of gastrostomy tube Did not tolerate PO tylenol pills well Abdominal pain not bad  Objective: Vital signs in last 24 hours: Temp:  [97.4 F (36.3 C)-98.9 F (37.2 C)] 98.4 F (36.9 C) (02/07 1012) Pulse Rate:  [58-72] 67 (02/07 1012) Resp:  [10-17] 15 (02/07 1012) BP: (122-169)/(57-83) 162/73 (02/07 1012) SpO2:  [92 %-99 %] 92 % (02/07 1012) Last BM Date: 09/07/21  Intake/Output from previous day: 02/06 0701 - 02/07 0700 In: 2030.3 [P.O.:280; I.V.:1650.3; IV Piggyback:100] Out: 1125 [Urine:455; Drains:650; Blood:20] Intake/Output this shift: No intake/output data recorded.  GI: Abd Soft, G-tube in place with significant amount of drainage, incisions c/d/i  Lab Results: CBC  Recent Labs    09/08/21 1148 09/09/21 0351  WBC 15.0* 10.7*  HGB 13.3 12.1  HCT 40.1 36.5  PLT 260 242   BMET Recent Labs    09/08/21 1148 09/09/21 0351  NA  --  139  K  --  3.3*  CL  --  101  CO2  --  31  GLUCOSE  --  113*  BUN  --  16  CREATININE 0.79 0.88  CALCIUM  --  9.3   PT/INR No results for input(s): LABPROT, INR in the last 72 hours. ABG No results for input(s): PHART, HCO3 in the last 72 hours.  Invalid input(s): PCO2, PO2  Anti-infectives: Anti-infectives (From admission, onward)    Start     Dose/Rate Route Frequency Ordered Stop   09/08/21 0600  ceFAZolin (ANCEF) IVPB 2g/100 mL premix        2 g 200 mL/hr over 30 Minutes Intravenous On call to O.R. 09/08/21 0529 09/08/21 0800       Medications: Scheduled Meds:  acetaminophen  1,000 mg Oral Q6H   enoxaparin (LOVENOX) injection  40 mg Subcutaneous Q24H   oxybutynin  10 mg Oral QHS   pantoprazole (PROTONIX) IV  40 mg Intravenous QHS   Continuous Infusions:  dextrose 5 % and 0.45 % NaCl with KCl 20 mEq/L     dextrose 5 % and 0.45% NaCl 50 mL/hr at 09/08/21 1258   potassium chloride      PRN Meds:.diphenhydrAMINE **OR** diphenhydrAMINE, HYDROmorphone (DILAUDID) injection, ketorolac, metoprolol tartrate, nitroGLYCERIN, ondansetron **OR** ondansetron (ZOFRAN) IV, oxyCODONE  Assessment/Plan: s/p Procedure(s): LAPAROSCOPIC REVISION HIATAL HERNIA REPAIR WITH FUNDOPLICATION AND MESH LAPAROSCOPIC INSERTION GASTROSTOMY TUBE 09/08/2021  Likely edema at GE junction and possible ileus causing high G-tube output and nausea with PO intake Continue G tube to straight drain today Continue clear liquid diet as tolerated Nausea/pain medications as needed Replace hypokalemia and provide maintenance IVF   LOS: 0 days   FEN: IVF ID: None VTE: Lovenox Foley: Removed Dispo: Continued care inpatient unit today    Felicie Morn, MD  [facility] Use [email or web address] to contact on call provider  Daily Billing:

## 2021-09-09 NOTE — Progress Notes (Signed)
Transition of Care [facility]) Screening Note  Patient Details  Name: Sully Manzi Date of Birth: 1945-12-29  Transition of Care [facility]) CM/SW Contact:    Sherie Don, LCSW Phone Number: 09/09/2021, 9:42 AM  Transition of Care Department [facility]) has reviewed patient and no TOC needs have been identified at this time. We will continue to monitor patient advancement through interdisciplinary progression rounds. If new patient transition needs arise, please place a TOC consult.

## 2021-09-10 LAB — BASIC METABOLIC PANEL
Anion gap: 8 (ref 5–15)
BUN: 10 mg/dL (ref 8–23)
CO2: 28 mmol/L (ref 22–32)
Calcium: 9.2 mg/dL (ref 8.9–10.3)
Chloride: 103 mmol/L (ref 98–111)
Creatinine, Ser: 0.64 mg/dL (ref 0.44–1.00)
GFR, Estimated: >60 mL/min (ref >60)
Glucose, Bld: 99 mg/dL (ref 70–99)
Potassium: 3.5 mmol/L (ref 3.5–5.1)
Sodium: 139 mmol/L (ref 135–145)

## 2021-09-10 LAB — CBC
HCT: 37.8 % (ref 36.0–46.0)
Hemoglobin: 12.7 g/dL (ref 12.0–15.0)
MCH: 31.4 pg (ref 26.0–34.0)
MCHC: 33.6 g/dL (ref 30.0–36.0)
MCV: 93.3 fL (ref 80.0–100.0)
Platelets: 244 10*3/uL (ref 150–400)
RBC: 4.05 MIL/uL (ref 3.87–5.11)
RDW: 14.1 % (ref 11.5–15.5)
WBC: 10 10*3/uL (ref 4.0–10.5)
nRBC: 0 % (ref 0.0–0.2)

## 2021-09-10 LAB — MAGNESIUM: Magnesium: 2 mg/dL (ref 1.7–2.4)

## 2021-09-10 MED ORDER — TRAMADOL HCL 50 MG PO TABS: 50.0000 mg | ORAL_TABLET | Freq: Four times a day (QID) | ORAL | 0 refills | Status: DC | PRN

## 2021-09-10 MED ORDER — TRAMADOL HCL 50 MG PO TABS: 50.0000 mg | ORAL_TABLET | Freq: Four times a day (QID) | ORAL | Status: DC | PRN

## 2021-09-10 MED ORDER — METOCLOPRAMIDE HCL 5 MG/ML IJ SOLN
10.0000 mg | Freq: Four times a day (QID) | INTRAMUSCULAR | Status: DC
  Administered 2021-09-10 (×3): 10 mg via INTRAVENOUS
  Filled 2021-09-10 (×3): qty 2

## 2021-09-10 NOTE — Plan of Care (Signed)

## 2021-09-10 NOTE — Progress Notes (Signed)
Progress Note: General Surgery Service   Chief Complaint/Subjective: Nausea much improved today.  G tube with significant output overnight, but slowing  Objective: Vital signs in last 24 hours: Temp:  [98.4 F (36.9 C)-99.2 F (37.3 C)] 98.6 F (37 C) (02/08 0542) Pulse Rate:  [67-84] 84 (02/08 0542) Resp:  [15-18] 18 (02/08 0542) BP: (146-171)/(73-93) 165/83 (02/08 0542) SpO2:  [91 %-94 %] 93 % (02/08 0542) Last BM Date: 09/07/21  Intake/Output from previous day: 02/07 0701 - 02/08 0700 In: 570.5 [P.O.:240; I.V.:58.7; IV Piggyback:271.8] Out: 950 [Drains:950] Intake/Output this shift: No intake/output data recorded.  GI: Abd Soft, G-tube in place with significant amount of drainage, incisions c/d/i  Lab Results: CBC  Recent Labs    09/09/21 0351 09/10/21 0333  WBC 10.7* 10.0  HGB 12.1 12.7  HCT 36.5 37.8  PLT 242 244    BMET Recent Labs    09/09/21 0351 09/10/21 0333  NA 139 139  K 3.3* 3.5  CL 101 103  CO2 31 28  GLUCOSE 113* 99  BUN 16 10  CREATININE 0.88 0.64  CALCIUM 9.3 9.2    PT/INR No results for input(s): LABPROT, INR in the last 72 hours. ABG No results for input(s): PHART, HCO3 in the last 72 hours.  Invalid input(s): PCO2, PO2  Anti-infectives: Anti-infectives (From admission, onward)    Start     Dose/Rate Route Frequency Ordered Stop   09/08/21 0600  ceFAZolin (ANCEF) IVPB 2g/100 mL premix        2 g 200 mL/hr over 30 Minutes Intravenous On call to O.R. 09/08/21 0529 09/08/21 0800       Medications: Scheduled Meds:  acetaminophen  1,000 mg Oral Q6H   enoxaparin (LOVENOX) injection  40 mg Subcutaneous Q24H   metoCLOPramide (REGLAN) injection  10 mg Intravenous Q6H   oxybutynin  10 mg Oral QHS   pantoprazole (PROTONIX) IV  40 mg Intravenous QHS   Continuous Infusions:   PRN Meds:.diphenhydrAMINE **OR** diphenhydrAMINE, HYDROmorphone (DILAUDID) injection, ketorolac, metoprolol tartrate, nitroGLYCERIN, ondansetron **OR**  ondansetron (ZOFRAN) IV, prochlorperazine, traMADol  Assessment/Plan: s/p Procedure(s): LAPAROSCOPIC REVISION HIATAL HERNIA REPAIR WITH FUNDOPLICATION AND MESH LAPAROSCOPIC INSERTION GASTROSTOMY TUBE 09/08/2021  Likely had some edema at GE junction causing swallowing issues yesterday, doing better today Clamp G tube Full liquid diet If she does well with full liquid diet, discharge home this afternoon Nausea/pain medications as needed   LOS: 1 day   FEN: FLD ID: None VTE: Lovenox Foley: Removed Dispo: Hopefully home this afternoon    Julie Morn, MD  [facility] Use [email or web address] to contact on call provider  Daily Billing:

## 2021-09-11 NOTE — Discharge Summary (Signed)
°  Patient ID: Julie Day [id number] 75 y.o. 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 Kieth Day.  She had some nausea postoperatively but this improved and she was discharged on postoperative day 2.  Consults: None  Significant Diagnostic Studies: none  Treatments: surgery: as above  Disposition: Home  Patient Instructions:  Allergies as of 09/10/2021       Reactions   Codeine Nausea And Vomiting        Medication List     TAKE these medications    CALCIUM 500 + D3 PO Take 2 tablets by mouth daily.   docusate sodium 100 MG capsule Commonly known as: COLACE Take 100 mg by mouth at bedtime.   fexofenadine 180 MG tablet Commonly known as: ALLEGRA Take 180 mg by mouth daily.   nitroGLYCERIN 0.4 MG SL tablet Commonly known as: NITROSTAT Place 0.4 mg under the tongue every 5 (five) minutes as needed for chest pain.   oxybutynin 10 MG 24 hr tablet Commonly known as: DITROPAN-XL Take 10 mg by mouth  at bedtime.   pantoprazole 40 MG tablet Commonly known as: PROTONIX Take 40 mg by mouth 2 (two) times daily.   promethazine 12.5 MG tablet Commonly known as: PHENERGAN Take 1-2 tablets (12.5-25 mg total) by mouth every 6 (six) hours as needed for nausea or vomiting.   sucralfate 1 g tablet Commonly known as: CARAFATE Take 1 g by mouth 3 (three) times daily before meals.   traMADol 50 MG tablet Commonly known as: Ultram Take 1 tablet (50 mg total) by mouth every 6 (six) hours as needed.   triamterene-hydrochlorothiazide 37.5-25 MG tablet Commonly known as: MAXZIDE-25 Take 1 tablet by mouth daily.   trimethoprim 100 MG tablet Commonly known as: TRIMPEX Take 100 mg by mouth daily.   vitamin B-12 1000 MCG tablet Commonly known as: CYANOCOBALAMIN Take 1,000 mcg by mouth 2 (two) times a week.        Activity: no heavy lifting for 4 weeks Diet:  Slowly advance diet consistency Wound Care: keep wound clean and dry  Follow-up:  With Dr. Kieth Day in 6-8 weeks for G-tube removal and earlier if needed  Signed: [place]   09/11/2021, 7:53 AM

## 2021-09-18 ENCOUNTER — Other Ambulatory Visit: Payer: Self-pay | Admitting: General Surgery

## 2021-09-18 DIAGNOSIS — R1310 Dysphagia, unspecified: Secondary | ICD-10-CM

## 2021-09-19 ENCOUNTER — Ambulatory Visit
Admission: RE | Admit: 2021-09-19 | Discharge: 2021-09-19 | Disposition: A | Discharge status: Home / Self Care | Payer: Medicare HMO | Source: Ambulatory Visit | Attending: General Surgery | Admitting: General Surgery

## 2021-09-19 ENCOUNTER — Other Ambulatory Visit: Payer: Self-pay | Admitting: General Surgery

## 2021-09-19 ENCOUNTER — Other Ambulatory Visit: Payer: Self-pay

## 2021-09-19 DIAGNOSIS — R1310 Dysphagia, unspecified: Secondary | ICD-10-CM | POA: Diagnosis not present

## 2021-09-19 DIAGNOSIS — K224 Dyskinesia of esophagus: Secondary | ICD-10-CM | POA: Diagnosis not present

## 2021-10-15 ENCOUNTER — Other Ambulatory Visit: Payer: Self-pay | Admitting: Physician Assistant

## 2021-10-15 ENCOUNTER — Ambulatory Visit: Payer: Medicare HMO | Admitting: Physician Assistant

## 2021-10-15 ENCOUNTER — Other Ambulatory Visit: Payer: Self-pay

## 2021-10-15 ENCOUNTER — Encounter: Payer: Self-pay | Admitting: Physician Assistant

## 2021-10-15 DIAGNOSIS — C44722 Squamous cell carcinoma of skin of right lower limb, including hip: Secondary | ICD-10-CM | POA: Diagnosis not present

## 2021-10-15 DIAGNOSIS — Z85828 Personal history of other malignant neoplasm of skin: Secondary | ICD-10-CM

## 2021-10-15 DIAGNOSIS — L570 Actinic keratosis: Secondary | ICD-10-CM

## 2021-10-15 DIAGNOSIS — Z86018 Personal history of other benign neoplasm: Secondary | ICD-10-CM

## 2021-10-15 DIAGNOSIS — D485 Neoplasm of uncertain behavior of skin: Secondary | ICD-10-CM

## 2021-10-15 DIAGNOSIS — Z1283 Encounter for screening for malignant neoplasm of skin: Secondary | ICD-10-CM

## 2021-10-15 MED ORDER — MUPIROCIN 2 % EX OINT: 1.0000 "application " | TOPICAL_OINTMENT | Freq: Every day | CUTANEOUS | 0 refills | Status: DC

## 2021-10-15 NOTE — Patient Instructions (Signed)

## 2021-10-27 DIAGNOSIS — Z79899 Other long term (current) drug therapy: Secondary | ICD-10-CM | POA: Diagnosis not present

## 2021-10-27 DIAGNOSIS — M353 Polymyalgia rheumatica: Secondary | ICD-10-CM | POA: Diagnosis not present

## 2021-10-27 DIAGNOSIS — Z7952 Long term (current) use of systemic steroids: Secondary | ICD-10-CM | POA: Diagnosis not present

## 2021-10-27 DIAGNOSIS — M7061 Trochanteric bursitis, right hip: Secondary | ICD-10-CM | POA: Diagnosis not present

## 2021-10-27 DIAGNOSIS — M7062 Trochanteric bursitis, left hip: Secondary | ICD-10-CM | POA: Diagnosis not present

## 2021-10-27 DIAGNOSIS — Z79631 Long term (current) use of antimetabolite agent: Secondary | ICD-10-CM | POA: Diagnosis not present

## 2021-10-28 NOTE — Progress Notes (Signed)
? ?  Follow-Up Visit ?  ?Subjective  ?Julie Day is a 76 y.o. female who presents for the following: Annual Exam (Here for annual skin exam. Patient has many places she is worried about all over. History of atypical moles and non mole skin cancers. ). ? ? ?The following portions of the chart were reviewed this encounter and updated as appropriate:  Tobacco  Allergies  Meds  Problems  Med Hx  Surg Hx  Fam Hx   ?  ? ?Objective  ?Well appearing patient in no apparent distress; mood and affect are within normal limits. ? ?A full examination was performed including scalp, head, eyes, ears, nose, lips, neck, chest, axillae, abdomen, back, buttocks, bilateral upper extremities, bilateral lower extremities, hands, feet, fingers, toes, fingernails, and toenails. All findings within normal limits unless otherwise noted below. ? ?Chest (Upper Torso, Anterior), Right Nasal Sidewall ?Erythematous patches with gritty scale. ? ?Right Lower Leg - Anterior ?Volcano growth on pink base  ? ? ? ? ? ? ? ?Assessment & Plan  ?AK (actinic keratosis) (2) ?Chest (Upper Torso, Anterior); Right Nasal Sidewall ? ?Destruction of lesion - Chest (Upper Torso, Anterior), Right Nasal Sidewall ?Complexity: simple   ?Destruction method: cryotherapy   ?Informed consent: discussed and consent obtained   ?Timeout:  patient name, date of birth, surgical site, and procedure verified ?Lesion destroyed using liquid nitrogen: Yes   ?Region frozen until ice ball extended beyond lesion: Yes   ?Cryotherapy cycles:  1 ?Outcome: patient tolerated procedure well with no complications   ?Post-procedure details: wound care instructions given   ? ?SCC (squamous cell carcinoma), leg, right ?Right Lower Leg - Anterior ? ?Skin / nail biopsy ?Type of biopsy: tangential   ?Informed consent: discussed and consent obtained   ?Timeout: patient name, date of birth, surgical site, and procedure verified   ?Procedure prep:  Patient was prepped and draped in usual sterile  fashion (Non sterile) ?Prep type:  Chlorhexidine ?Anesthesia: the lesion was anesthetized in a standard fashion   ?Anesthetic:  1% lidocaine w/ epinephrine 1-100,000 local infiltration ?Instrument used: flexible razor blade   ?Outcome: patient tolerated procedure well   ?Post-procedure details: wound care instructions given   ? ?Destruction of lesion ?Complexity: simple   ?Destruction method: electrodesiccation and curettage   ?Informed consent: discussed and consent obtained   ?Timeout:  patient name, date of birth, surgical site, and procedure verified ?Anesthesia: the lesion was anesthetized in a standard fashion   ?Anesthetic:  1% lidocaine w/ epinephrine 1-100,000 local infiltration ?Curettage performed in three different directions: Yes   ?Electrodesiccation performed over the curetted area: Yes   ?Curettage cycles:  3 ?Margin per side (cm):  0.1 ?Final wound size (cm):  1.2 ?Hemostasis achieved with:  aluminum chloride and electrodesiccation ?Outcome: patient tolerated procedure well with no complications   ?Post-procedure details: sterile dressing applied and wound care instructions given   ?Dressing type: pressure dressing and petrolatum   ? ?mupirocin ointment (BACTROBAN) 2 % ?Apply 1 application. topically daily. ? ?Specimen 1 - Surgical pathology ?Differential Diagnosis: R/O BCC vs SCC - treated after biopsy ? ?Check Margins: yes ? ? ? ?I, Priseis Cratty, PA-C, have reviewed all documentation's for this visit.  The documentation on 10/28/21 for the exam, diagnosis, procedures and orders are all accurate and complete. ?

## 2021-10-29 ENCOUNTER — Telehealth: Payer: Self-pay

## 2021-10-29 NOTE — Telephone Encounter (Signed)
-----   Message from Warren Danes, [place] sent at 10/28/2021  4:28 PM EDT ----- ?Scc tx with biopsy. Check status. Re check 9 months. RTC if recurs.  ?

## 2021-10-29 NOTE — Telephone Encounter (Signed)
Phone call to patient with her pathology results. Patient aware of results.  

## 2021-12-04 DIAGNOSIS — R066 Hiccough: Secondary | ICD-10-CM | POA: Diagnosis not present

## 2021-12-04 DIAGNOSIS — R142 Eructation: Secondary | ICD-10-CM | POA: Diagnosis not present

## 2021-12-04 DIAGNOSIS — R1310 Dysphagia, unspecified: Secondary | ICD-10-CM | POA: Diagnosis not present

## 2021-12-04 DIAGNOSIS — K219 Gastro-esophageal reflux disease without esophagitis: Secondary | ICD-10-CM | POA: Diagnosis not present

## 2021-12-04 DIAGNOSIS — Z1211 Encounter for screening for malignant neoplasm of colon: Secondary | ICD-10-CM | POA: Diagnosis not present

## 2021-12-04 DIAGNOSIS — K5900 Constipation, unspecified: Secondary | ICD-10-CM | POA: Diagnosis not present

## 2021-12-23 DIAGNOSIS — Z1331 Encounter for screening for depression: Secondary | ICD-10-CM | POA: Diagnosis not present

## 2021-12-23 DIAGNOSIS — R0789 Other chest pain: Secondary | ICD-10-CM | POA: Diagnosis not present

## 2021-12-23 DIAGNOSIS — Z0000 Encounter for general adult medical examination without abnormal findings: Secondary | ICD-10-CM | POA: Diagnosis not present

## 2021-12-23 DIAGNOSIS — M4696 Unspecified inflammatory spondylopathy, lumbar region: Secondary | ICD-10-CM | POA: Diagnosis not present

## 2021-12-23 DIAGNOSIS — D692 Other nonthrombocytopenic purpura: Secondary | ICD-10-CM | POA: Diagnosis not present

## 2021-12-23 DIAGNOSIS — K219 Gastro-esophageal reflux disease without esophagitis: Secondary | ICD-10-CM | POA: Diagnosis not present

## 2021-12-23 DIAGNOSIS — I10 Essential (primary) hypertension: Secondary | ICD-10-CM | POA: Diagnosis not present

## 2021-12-23 DIAGNOSIS — R5383 Other fatigue: Secondary | ICD-10-CM | POA: Diagnosis not present

## 2021-12-23 DIAGNOSIS — Z23 Encounter for immunization: Secondary | ICD-10-CM | POA: Diagnosis not present

## 2021-12-23 DIAGNOSIS — I700 Atherosclerosis of aorta: Secondary | ICD-10-CM | POA: Diagnosis not present

## 2021-12-23 DIAGNOSIS — E7800 Pure hypercholesterolemia, unspecified: Secondary | ICD-10-CM | POA: Diagnosis not present

## 2021-12-23 DIAGNOSIS — R1310 Dysphagia, unspecified: Secondary | ICD-10-CM | POA: Diagnosis not present

## 2022-01-02 DIAGNOSIS — K2980 Duodenitis without bleeding: Secondary | ICD-10-CM | POA: Diagnosis not present

## 2022-01-02 DIAGNOSIS — R1310 Dysphagia, unspecified: Secondary | ICD-10-CM | POA: Diagnosis not present

## 2022-01-02 DIAGNOSIS — R634 Abnormal weight loss: Secondary | ICD-10-CM | POA: Diagnosis not present

## 2022-01-02 DIAGNOSIS — R140 Abdominal distension (gaseous): Secondary | ICD-10-CM | POA: Diagnosis not present

## 2022-01-02 DIAGNOSIS — K2289 Other specified disease of esophagus: Secondary | ICD-10-CM | POA: Diagnosis not present

## 2022-01-02 DIAGNOSIS — Z9889 Other specified postprocedural states: Secondary | ICD-10-CM | POA: Diagnosis not present

## 2022-01-02 DIAGNOSIS — K319 Disease of stomach and duodenum, unspecified: Secondary | ICD-10-CM | POA: Diagnosis not present

## 2022-01-02 DIAGNOSIS — K3189 Other diseases of stomach and duodenum: Secondary | ICD-10-CM | POA: Diagnosis not present

## 2022-01-02 DIAGNOSIS — K317 Polyp of stomach and duodenum: Secondary | ICD-10-CM | POA: Diagnosis not present

## 2022-01-07 DIAGNOSIS — K319 Disease of stomach and duodenum, unspecified: Secondary | ICD-10-CM | POA: Diagnosis not present

## 2022-01-07 DIAGNOSIS — K2980 Duodenitis without bleeding: Secondary | ICD-10-CM | POA: Diagnosis not present

## 2022-01-07 DIAGNOSIS — K2289 Other specified disease of esophagus: Secondary | ICD-10-CM | POA: Diagnosis not present

## 2022-01-28 DIAGNOSIS — R1310 Dysphagia, unspecified: Secondary | ICD-10-CM | POA: Diagnosis not present

## 2022-01-28 DIAGNOSIS — R142 Eructation: Secondary | ICD-10-CM | POA: Diagnosis not present

## 2022-02-16 DIAGNOSIS — M8580 Other specified disorders of bone density and structure, unspecified site: Secondary | ICD-10-CM | POA: Diagnosis not present

## 2022-02-16 DIAGNOSIS — M7062 Trochanteric bursitis, left hip: Secondary | ICD-10-CM | POA: Diagnosis not present

## 2022-02-16 DIAGNOSIS — M7061 Trochanteric bursitis, right hip: Secondary | ICD-10-CM | POA: Diagnosis not present

## 2022-02-16 DIAGNOSIS — M353 Polymyalgia rheumatica: Secondary | ICD-10-CM | POA: Diagnosis not present

## 2022-03-04 DIAGNOSIS — R1310 Dysphagia, unspecified: Secondary | ICD-10-CM | POA: Diagnosis not present

## 2022-03-04 DIAGNOSIS — R0683 Snoring: Secondary | ICD-10-CM | POA: Diagnosis not present

## 2022-03-04 DIAGNOSIS — I10 Essential (primary) hypertension: Secondary | ICD-10-CM | POA: Diagnosis not present

## 2022-03-04 DIAGNOSIS — R69 Illness, unspecified: Secondary | ICD-10-CM | POA: Diagnosis not present

## 2022-03-04 DIAGNOSIS — K219 Gastro-esophageal reflux disease without esophagitis: Secondary | ICD-10-CM | POA: Diagnosis not present

## 2022-03-11 DIAGNOSIS — D123 Benign neoplasm of transverse colon: Secondary | ICD-10-CM | POA: Diagnosis not present

## 2022-03-11 DIAGNOSIS — K5730 Diverticulosis of large intestine without perforation or abscess without bleeding: Secondary | ICD-10-CM | POA: Diagnosis not present

## 2022-03-11 DIAGNOSIS — K648 Other hemorrhoids: Secondary | ICD-10-CM | POA: Diagnosis not present

## 2022-03-11 DIAGNOSIS — D122 Benign neoplasm of ascending colon: Secondary | ICD-10-CM | POA: Diagnosis not present

## 2022-03-11 DIAGNOSIS — D120 Benign neoplasm of cecum: Secondary | ICD-10-CM | POA: Diagnosis not present

## 2022-03-11 DIAGNOSIS — Z1211 Encounter for screening for malignant neoplasm of colon: Secondary | ICD-10-CM | POA: Diagnosis not present

## 2022-03-13 DIAGNOSIS — D123 Benign neoplasm of transverse colon: Secondary | ICD-10-CM | POA: Diagnosis not present

## 2022-03-13 DIAGNOSIS — D120 Benign neoplasm of cecum: Secondary | ICD-10-CM | POA: Diagnosis not present

## 2022-03-18 ENCOUNTER — Other Ambulatory Visit: Payer: Self-pay | Admitting: Gastroenterology

## 2022-03-18 ENCOUNTER — Other Ambulatory Visit (HOSPITAL_COMMUNITY): Payer: Self-pay | Admitting: Gastroenterology

## 2022-03-18 ENCOUNTER — Other Ambulatory Visit (HOSPITAL_COMMUNITY): Payer: Self-pay

## 2022-03-18 DIAGNOSIS — R142 Eructation: Secondary | ICD-10-CM

## 2022-03-18 DIAGNOSIS — R1310 Dysphagia, unspecified: Secondary | ICD-10-CM

## 2022-03-18 DIAGNOSIS — R112 Nausea with vomiting, unspecified: Secondary | ICD-10-CM

## 2022-03-20 ENCOUNTER — Other Ambulatory Visit: Payer: Self-pay | Admitting: Gastroenterology

## 2022-03-20 ENCOUNTER — Ambulatory Visit
Admission: RE | Admit: 2022-03-20 | Discharge: 2022-03-20 | Disposition: A | Discharge status: Home / Self Care | Payer: Medicare HMO | Source: Ambulatory Visit | Attending: Gastroenterology | Admitting: Gastroenterology

## 2022-03-20 DIAGNOSIS — R1310 Dysphagia, unspecified: Secondary | ICD-10-CM

## 2022-03-20 DIAGNOSIS — K224 Dyskinesia of esophagus: Secondary | ICD-10-CM | POA: Diagnosis not present

## 2022-03-20 DIAGNOSIS — R142 Eructation: Secondary | ICD-10-CM

## 2022-03-23 ENCOUNTER — Other Ambulatory Visit: Payer: Self-pay | Admitting: Gastroenterology

## 2022-03-23 ENCOUNTER — Other Ambulatory Visit (HOSPITAL_COMMUNITY): Payer: Self-pay | Admitting: Gastroenterology

## 2022-03-23 ENCOUNTER — Ambulatory Visit (HOSPITAL_BASED_OUTPATIENT_CLINIC_OR_DEPARTMENT_OTHER): Payer: Medicare HMO

## 2022-03-23 ENCOUNTER — Ambulatory Visit (HOSPITAL_BASED_OUTPATIENT_CLINIC_OR_DEPARTMENT_OTHER)
Admission: RE | Admit: 2022-03-23 | Discharge: 2022-03-23 | Disposition: A | Discharge status: Home / Self Care | Payer: Medicare HMO | Source: Ambulatory Visit | Attending: Gastroenterology | Admitting: Gastroenterology

## 2022-03-23 DIAGNOSIS — R933 Abnormal findings on diagnostic imaging of other parts of digestive tract: Secondary | ICD-10-CM

## 2022-03-23 DIAGNOSIS — J432 Centrilobular emphysema: Secondary | ICD-10-CM | POA: Diagnosis not present

## 2022-03-23 DIAGNOSIS — K223 Perforation of esophagus: Secondary | ICD-10-CM | POA: Diagnosis not present

## 2022-03-23 DIAGNOSIS — R918 Other nonspecific abnormal finding of lung field: Secondary | ICD-10-CM | POA: Diagnosis not present

## 2022-03-23 DIAGNOSIS — N289 Disorder of kidney and ureter, unspecified: Secondary | ICD-10-CM | POA: Diagnosis not present

## 2022-03-23 DIAGNOSIS — K769 Liver disease, unspecified: Secondary | ICD-10-CM | POA: Diagnosis not present

## 2022-03-23 MED ORDER — IOHEXOL 300 MG/ML  SOLN
100.0000 mL | Freq: Once | INTRAMUSCULAR | Status: AC | PRN
  Administered 2022-03-23: 85 mL via INTRAVENOUS

## 2022-03-24 ENCOUNTER — Other Ambulatory Visit: Payer: Self-pay

## 2022-03-24 ENCOUNTER — Encounter (HOSPITAL_COMMUNITY): Payer: Self-pay

## 2022-03-24 ENCOUNTER — Inpatient Hospital Stay (HOSPITAL_COMMUNITY)
Admission: EM | Admit: 2022-03-24 | Discharge: 2022-03-27 | DRG: 368 | Disposition: A | Discharge status: Home Health | Payer: Medicare HMO | Source: Ambulatory Visit | Attending: General Surgery | Admitting: General Surgery

## 2022-03-24 DIAGNOSIS — Z791 Long term (current) use of non-steroidal anti-inflammatories (NSAID): Secondary | ICD-10-CM | POA: Diagnosis not present

## 2022-03-24 DIAGNOSIS — Z8249 Family history of ischemic heart disease and other diseases of the circulatory system: Secondary | ICD-10-CM

## 2022-03-24 DIAGNOSIS — Z85828 Personal history of other malignant neoplasm of skin: Secondary | ICD-10-CM | POA: Diagnosis not present

## 2022-03-24 DIAGNOSIS — I10 Essential (primary) hypertension: Secondary | ICD-10-CM | POA: Diagnosis present

## 2022-03-24 DIAGNOSIS — K9189 Other postprocedural complications and disorders of digestive system: Secondary | ICD-10-CM | POA: Diagnosis not present

## 2022-03-24 DIAGNOSIS — Z96651 Presence of right artificial knee joint: Secondary | ICD-10-CM | POA: Diagnosis not present

## 2022-03-24 DIAGNOSIS — R918 Other nonspecific abnormal finding of lung field: Secondary | ICD-10-CM | POA: Diagnosis not present

## 2022-03-24 DIAGNOSIS — K219 Gastro-esophageal reflux disease without esophagitis: Secondary | ICD-10-CM | POA: Diagnosis present

## 2022-03-24 DIAGNOSIS — Z90710 Acquired absence of both cervix and uterus: Secondary | ICD-10-CM

## 2022-03-24 DIAGNOSIS — R1310 Dysphagia, unspecified: Secondary | ICD-10-CM | POA: Diagnosis not present

## 2022-03-24 DIAGNOSIS — K223 Perforation of esophagus: Principal | ICD-10-CM | POA: Diagnosis present

## 2022-03-24 DIAGNOSIS — E43 Unspecified severe protein-calorie malnutrition: Secondary | ICD-10-CM | POA: Diagnosis not present

## 2022-03-24 DIAGNOSIS — R69 Illness, unspecified: Secondary | ICD-10-CM | POA: Diagnosis not present

## 2022-03-24 DIAGNOSIS — R634 Abnormal weight loss: Secondary | ICD-10-CM | POA: Diagnosis not present

## 2022-03-24 DIAGNOSIS — K2289 Other specified disease of esophagus: Secondary | ICD-10-CM | POA: Diagnosis not present

## 2022-03-24 DIAGNOSIS — J309 Allergic rhinitis, unspecified: Secondary | ICD-10-CM | POA: Diagnosis present

## 2022-03-24 DIAGNOSIS — R633 Feeding difficulties, unspecified: Secondary | ICD-10-CM | POA: Diagnosis present

## 2022-03-24 DIAGNOSIS — Z79899 Other long term (current) drug therapy: Secondary | ICD-10-CM

## 2022-03-24 DIAGNOSIS — F17210 Nicotine dependence, cigarettes, uncomplicated: Secondary | ICD-10-CM | POA: Diagnosis not present

## 2022-03-24 DIAGNOSIS — K2900 Acute gastritis without bleeding: Secondary | ICD-10-CM | POA: Diagnosis present

## 2022-03-24 DIAGNOSIS — Z6821 Body mass index (BMI) 21.0-21.9, adult: Secondary | ICD-10-CM

## 2022-03-24 DIAGNOSIS — I712 Thoracic aortic aneurysm, without rupture, unspecified: Secondary | ICD-10-CM | POA: Diagnosis present

## 2022-03-24 DIAGNOSIS — R933 Abnormal findings on diagnostic imaging of other parts of digestive tract: Secondary | ICD-10-CM | POA: Diagnosis not present

## 2022-03-24 DIAGNOSIS — F17200 Nicotine dependence, unspecified, uncomplicated: Secondary | ICD-10-CM | POA: Diagnosis not present

## 2022-03-24 DIAGNOSIS — R948 Abnormal results of function studies of other organs and systems: Secondary | ICD-10-CM | POA: Diagnosis not present

## 2022-03-24 DIAGNOSIS — K2970 Gastritis, unspecified, without bleeding: Secondary | ICD-10-CM | POA: Diagnosis not present

## 2022-03-24 DIAGNOSIS — Z9889 Other specified postprocedural states: Secondary | ICD-10-CM | POA: Diagnosis not present

## 2022-03-24 DIAGNOSIS — N3020 Other chronic cystitis without hematuria: Secondary | ICD-10-CM | POA: Diagnosis present

## 2022-03-24 DIAGNOSIS — Z20822 Contact with and (suspected) exposure to covid-19: Secondary | ICD-10-CM | POA: Diagnosis not present

## 2022-03-24 LAB — COMPREHENSIVE METABOLIC PANEL
ALT: 14 U/L (ref 0–44)
AST: 20 U/L (ref 15–41)
Albumin: 3.6 g/dL (ref 3.5–5.0)
Alkaline Phosphatase: 84 U/L (ref 38–126)
Anion gap: 11 (ref 5–15)
BUN: 6 mg/dL — ABNORMAL LOW (ref 8–23)
CO2: 26 mmol/L (ref 22–32)
Calcium: 9.9 mg/dL (ref 8.9–10.3)
Chloride: 103 mmol/L (ref 98–111)
Creatinine, Ser: 0.87 mg/dL (ref 0.44–1.00)
GFR, Estimated: >60 mL/min (ref >60)
Glucose, Bld: 102 mg/dL — ABNORMAL HIGH (ref 70–99)
Potassium: 3.6 mmol/L (ref 3.5–5.1)
Sodium: 140 mmol/L (ref 135–145)
Total Bilirubin: 0.5 mg/dL (ref 0.3–1.2)
Total Protein: 6.8 g/dL (ref 6.5–8.1)

## 2022-03-24 LAB — CBC WITH DIFFERENTIAL/PLATELET
Abs Immature Granulocytes: 0.02 10*3/uL (ref 0.00–0.07)
Basophils Absolute: 0.1 10*3/uL (ref 0.0–0.1)
Basophils Relative: 1 %
Eosinophils Absolute: 0.1 10*3/uL (ref 0.0–0.5)
Eosinophils Relative: 2 %
HCT: 38.8 % (ref 36.0–46.0)
Hemoglobin: 13 g/dL (ref 12.0–15.0)
Immature Granulocytes: 0 %
Lymphocytes Relative: 27 %
Lymphs Abs: 2 10*3/uL (ref 0.7–4.0)
MCH: 29.5 pg (ref 26.0–34.0)
MCHC: 33.5 g/dL (ref 30.0–36.0)
MCV: 88 fL (ref 80.0–100.0)
Monocytes Absolute: 0.5 10*3/uL (ref 0.1–1.0)
Monocytes Relative: 7 %
Neutro Abs: 4.7 10*3/uL (ref 1.7–7.7)
Neutrophils Relative %: 63 %
Platelets: 373 10*3/uL (ref 150–400)
RBC: 4.41 MIL/uL (ref 3.87–5.11)
RDW: 14.3 % (ref 11.5–15.5)
WBC: 7.4 10*3/uL (ref 4.0–10.5)
nRBC: 0 % (ref 0.0–0.2)

## 2022-03-24 LAB — LACTIC ACID, PLASMA
Lactic Acid, Venous: 1.3 mmol/L (ref 0.5–1.9)
Lactic Acid, Venous: 1.1 mmol/L (ref 0.5–1.9)

## 2022-03-24 LAB — SARS CORONAVIRUS 2 BY RT PCR: SARS Coronavirus 2 by RT PCR: NEGATIVE

## 2022-03-24 MED ORDER — KCL IN DEXTROSE-NACL 20-5-0.45 MEQ/L-%-% IV SOLN
INTRAVENOUS | Status: DC
  Filled 2022-03-24 (×5): qty 1000

## 2022-03-24 MED ORDER — PIPERACILLIN-TAZOBACTAM 3.375 G IVPB 30 MIN
3.3750 g | Freq: Once | INTRAVENOUS | Status: AC
  Administered 2022-03-24: 3.375 g via INTRAVENOUS
  Filled 2022-03-24: qty 50

## 2022-03-24 MED ORDER — PANTOPRAZOLE SODIUM 40 MG IV SOLR
40.0000 mg | Freq: Every day | INTRAVENOUS | Status: DC
  Administered 2022-03-24 – 2022-03-26 (×3): 40 mg via INTRAVENOUS
  Filled 2022-03-24 (×3): qty 10

## 2022-03-24 MED ORDER — SODIUM CHLORIDE 0.9 % IV SOLN: INTRAVENOUS | Status: DC

## 2022-03-24 MED ORDER — ONDANSETRON 4 MG PO TBDP: 4.0000 mg | ORAL_TABLET | Freq: Four times a day (QID) | ORAL | Status: DC | PRN

## 2022-03-24 MED ORDER — ACETAMINOPHEN 650 MG RE SUPP
650.0000 mg | RECTAL | Status: DC | PRN
Start: 2022-03-24 — End: 2022-03-27

## 2022-03-24 MED ORDER — ENOXAPARIN SODIUM 40 MG/0.4ML IJ SOSY
40.0000 mg | PREFILLED_SYRINGE | INTRAMUSCULAR | Status: DC
  Administered 2022-03-24 – 2022-03-26 (×2): 40 mg via SUBCUTANEOUS
  Filled 2022-03-24 (×3): qty 0.4

## 2022-03-24 MED ORDER — METOPROLOL TARTRATE 5 MG/5ML IV SOLN: 5.0000 mg | Freq: Four times a day (QID) | INTRAVENOUS | Status: DC | PRN

## 2022-03-24 MED ORDER — ONDANSETRON HCL 4 MG/2ML IJ SOLN: 4.0000 mg | Freq: Four times a day (QID) | INTRAMUSCULAR | Status: DC | PRN

## 2022-03-24 MED ORDER — MORPHINE SULFATE (PF) 2 MG/ML IV SOLN
2.0000 mg | INTRAVENOUS | Status: DC | PRN
  Administered 2022-03-27: 2 mg via INTRAVENOUS
  Filled 2022-03-24: qty 1

## 2022-03-24 MED ORDER — HYDRALAZINE HCL 20 MG/ML IJ SOLN: 10.0000 mg | INTRAMUSCULAR | Status: DC | PRN

## 2022-03-24 MED ORDER — PIPERACILLIN-TAZOBACTAM 3.375 G IVPB
3.3750 g | Freq: Three times a day (TID) | INTRAVENOUS | Status: DC
  Administered 2022-03-24 – 2022-03-27 (×8): 3.375 g via INTRAVENOUS
  Filled 2022-03-24 (×11): qty 50

## 2022-03-24 NOTE — Progress Notes (Signed)
Pharmacy Antibiotic Note  Julie Day is a 76 y.o. female admitted on 03/24/2022 with  dysphagia .  Pharmacy has been consulted for zosyn dosing for esophageal perforation. Pt is afebrile and WBC is WNL. SCr and lactic acid are also normal.   Plan: Zosyn 3.375gm IV Q8H (4 hr inf) F/u renal fxn, C&S, clinical status *Pharmacy will sign off and only follow peripherally as no dose adjustments are anticipated.     Temp (24hrs), Avg:97.9 F (36.6 C), Min:97.9 F (36.6 C), Max:97.9 F (36.6 C)  Recent Labs  Lab 03/24/22 0859  WBC 7.4  CREATININE 0.87  LATICACIDVEN 1.3    Estimated Creatinine Clearance: 46.2 mL/min (by C-G formula based on SCr of 0.87 mg/dL).    No Active Allergies  Thank you for allowing pharmacy to be a part of this patient's care.  [employer], Rande Lawman 03/24/2022 8:44 AM

## 2022-03-24 NOTE — Plan of Care (Signed)

## 2022-03-24 NOTE — H&P (View-Only) (Signed)
Referring Provider: Dr. Redmond Pulling Primary Care Physician:  Lavone Orn, MD Primary Gastroenterologist:  Dr. Therisa Doyne  Reason for Consultation:  Esophageal Perforation   HPI: Julie Day is a 76 y.o. female who is s/p Nissen fundoplication in 6789 with laparoscopic revision of the hiatal hernia repair, laparoscopic paraesophageal hernia repair and placement of a G-tube in Feb 2023. Reports difficulty eating solid foods since surgery in Feb 2023. Daughter and husband help with the history and report recurrent vomiting when lying down to sleep and G-tube was removed several weeks postop. She continued to have daily issues eating where she would have intense belching and regurgitation with eating a few bites of food. Liquids would pass down ok but she would still have recurrent belching. In June she had an EGD with empiric dilation of the distal esophagus and GEJ with a TTS balloon to 20 mm. She feels like the dilation helped somewhat for 1 day. Her difficulty eating has worsened over the last month where she has only been able to tolerate liquids. Has lost 30 pounds since Feb and about 10 pounds in the last 2 weeks. UGI on 8/18 showed a contained collection of fluid concerning for a fistula vs contained distal esophageal leak. CT yesterday showed contained esophageal leak.  Past Medical History:  Diagnosis Date   Allergic rhinitis    Anginal pain ([place])    Dx as GERD   Atypical mole 09/23/2017   Left Inner Thigh-Mild   Atypical mole 09/23/2017   Right Axilla-Mild   Atypical mole 09/05/2015   Center Abdomen-Severe (clear) (Dr. Nevada Crane)   Atypical mole 04/14/2019   Right Thigh-Solar Lentigo   Chronic cystitis    Degenerative disc disease    GERD (gastroesophageal reflux disease)    Hemangioma of liver    History of hiatal hernia 2017   Hypertension    SCC (squamous cell carcinoma) 09/05/2015   Left Dorsal Hand-Well Diff Keratoacanthoma (Dr. Nevada Crane) (free)   SCC (squamous cell carcinoma) 04/14/2019    Left Outer Shin-Keratoacanthoma (treatment after biopsy)   SCC (squamous cell carcinoma) 07/21/2019   Right Thigh-Keratoacanthoma (treatment Fluorouracil Cream)   Squamous cell carcinoma of skin 09/23/2017   Right Upper Shin-Well Diff (Cx3,5FU)    Past Surgical History:  Procedure Laterality Date   ABDOMINAL HYSTERECTOMY     CHOLECYSTECTOMY     COLON RESECTION N/A 05/31/2015   Procedure: LAPAROSCOPIC REPAIR OF HIATAL HERNIA  AND LAPAROSCOPIC NISSEN FUNDOPLICATION;  Surgeon: Fanny Skates, MD;  Location: [facility];  Service: General;  Laterality: N/A;   ESOPHAGEAL MANOMETRY N/A 02/27/2015   Procedure: ESOPHAGEAL MANOMETRY (EM);  Surgeon: Garlan Fair, MD;  Location: [facility];  Service: Endoscopy;  Laterality: N/A;   ESOPHAGOGASTRODUODENOSCOPY (EGD) WITH PROPOFOL N/A 02/26/2015   Procedure: ESOPHAGOGASTRODUODENOSCOPY (EGD) WITH PROPOFOL;  Surgeon: Garlan Fair, MD;  Location: [facility];  Service: Endoscopy;  Laterality: N/A;   HIATAL HERNIA REPAIR  05/31/2015   HIATAL HERNIA REPAIR N/A 09/08/2021   Procedure: LAPAROSCOPIC REVISION HIATAL HERNIA REPAIR WITH FUNDOPLICATION AND MESH;  Surgeon: Kinsinger, Arta Bruce, MD;  Location: [facility];  Service: General;  Laterality: N/A;   LAPAROSCOPIC INSERTION GASTROSTOMY TUBE N/A 09/08/2021   Procedure: LAPAROSCOPIC INSERTION GASTROSTOMY TUBE;  Surgeon: Mickeal Skinner, MD;  Location: [facility];  Service: General;  Laterality: N/A;  18FR   TOTAL KNEE ARTHROPLASTY Right 10/03/2019   Procedure: RIGHT TOTAL KNEE ARTHROPLASTY;  Surgeon: Melrose Nakayama, MD;  Location: [facility];  Service: Orthopedics;  Laterality: Right;   WISDOM TOOTH EXTRACTION  Prior to Admission medications   Medication Sig Start Date End Date Taking? Authorizing Provider  acetaminophen (TYLENOL) 650 MG CR tablet Take 650 mg by mouth daily as needed for pain.   Yes [provider]  Calcium Carb-Cholecalciferol (CALCIUM + D3 PO) Take 1 tablet by mouth daily.   Yes  [provider]  cetirizine (ZYRTEC) 10 MG tablet Take 10 mg by mouth daily. 01/07/22  Yes [provider]  Cyanocobalamin (VITAMIN B-12 PO) Take 1 tablet by mouth daily.   Yes [provider]  MELATONIN PO Take 1 tablet by mouth at bedtime.   Yes [provider]  meloxicam (MOBIC) 15 MG tablet Take 15 mg by mouth daily as needed for pain (bursitis).   Yes [provider]  nitroGLYCERIN (NITROSTAT) 0.4 MG SL tablet Place 0.4 mg under the tongue every 5 (five) minutes as needed for chest pain.   Yes [provider]  pantoprazole (PROTONIX) 40 MG tablet Take 40 mg by mouth daily.   Yes [provider]  sucralfate (CARAFATE) 1 GM/10ML suspension Take 1 g by mouth 2 (two) times daily. 03/13/22  Yes [provider]  triamterene-hydrochlorothiazide (MAXZIDE-25) 37.5-25 MG per tablet Take 1 tablet by mouth daily.    Yes [provider]  mupirocin ointment (BACTROBAN) 2 % Apply 1 application. topically daily. Patient not taking: Reported on 03/24/2022 10/15/21   Warren Danes, PA-C    Scheduled Meds:  enoxaparin (LOVENOX) injection  40 mg Subcutaneous Q24H   pantoprazole (PROTONIX) IV  40 mg Intravenous QHS   Continuous Infusions:  dextrose 5 % and 0.45 % NaCl with KCl 20 mEq/L 75 mL/hr at 03/24/22 1259   piperacillin-tazobactam (ZOSYN)  IV     PRN Meds:.acetaminophen, hydrALAZINE, metoprolol tartrate, morphine injection, ondansetron **OR** ondansetron (ZOFRAN) IV  Allergies as of 03/24/2022 - Review Complete 03/24/2022  Allergen Reaction Noted   Trexall [methotrexate] Other (See Comments) 03/24/2022   Prevnar 13 [pneumococcal 13-val conj vacc] Itching, Swelling, and Rash 03/24/2022    Family History  Problem Relation Age of Onset   Hypertension Mother    Hypertension Sister     Social History   Socioeconomic History   Marital status: Married    Spouse name: Not on file   Number of children: Not on file    Years of education: Not on file   Highest education level: Not on file  Occupational History   Not on file  Tobacco Use   Smoking status: Some Days    Types: Cigarettes    Last attempt to quit: 12/20/2008    Years since quitting: 13.2   Smokeless tobacco: Never  Vaping Use   Vaping Use: Never used  Substance and Sexual Activity   Alcohol use: No   Drug use: No   Sexual activity: Not on file  Other Topics Concern   Not on file  Social History Narrative   Not on file   Social Determinants of Health   Financial Resource Strain: Not on file  Food Insecurity: Not on file  Transportation Needs: Not on file  Physical Activity: Not on file  Stress: Not on file  Social Connections: Not on file  Intimate Partner Violence: Not on file    Review of Systems: All negative except as stated above in HPI.  Physical Exam: Vital signs: Vitals:   03/24/22 1315 03/24/22 1430  BP: 105/68 106/78  Pulse: 74 72  Resp: (!) 26 (!) 25  Temp:    SpO2: 96%  96%  T 97.7   General:   Alert,  thin, pleasant and cooperative in NAD Head: normocephalic, atraumatic Eyes: anicteric sclera ENT: oropharynx clear Neck: supple, nontender Lungs:  Clear throughout to auscultation.   No wheezes, crackles, or rhonchi. No acute distress. Heart:  Regular rate and rhythm; no murmurs, clicks, rubs,  or gallops. Abdomen: epigastric tenderness with minimal guarding, soft, nondistended, +BS  Rectal:  Deferred Ext: no edema  GI:  Lab Results: Recent Labs    03/24/22 0859  WBC 7.4  HGB 13.0  HCT 38.8  PLT 373   BMET Recent Labs    03/24/22 0859  NA 140  K 3.6  CL 103  CO2 26  GLUCOSE 102*  BUN 6*  CREATININE 0.87  CALCIUM 9.9   LFT Recent Labs    03/24/22 0859  PROT 6.8  ALBUMIN 3.6  AST 20  ALT 14  ALKPHOS 84  BILITOT 0.5   PT/INR No results for input(s): "LABPROT", "INR" in the last 72 hours.   Studies/Results: CT ABDOMEN W CONTRAST  Result Date: 03/23/2022 CLINICAL DATA:   Follow-up upper GI EXAM: CT CHEST AND ABDOMEN WITH CONTRAST TECHNIQUE: Multidetector CT imaging of the chest and abdomen was performed following the standard protocol during bolus administration of intravenous contrast. RADIATION DOSE REDUCTION: This exam was performed according to the departmental dose-optimization program which includes automated exposure control, adjustment of the mA and/or kV according to patient size and/or use of iterative reconstruction technique. CONTRAST:  68m OMNIPAQUE IOHEXOL 300 MG/ML  SOLN COMPARISON:  Upper GI dated March 20, 2022 FINDINGS: CT CHEST FINDINGS Cardiovascular: Normal heart size. No pericardial effusion. Normal heart size. No pericardial effusion. Dilated ascending thoracic aorta measuring 4.5 x 4.4 cm. Mild atherosclerotic disease of the thoracic aorta. Mediastinum/Nodes: Patulous and fluid-filled esophagus. Thyroid is unremarkable. No pathologically enlarged lymph nodes seen in the chest. Lungs/Pleura: Central airways are patent. Moderate centrilobular emphysema. Mild right-greater-than-left ground-glass opacities of the upper lungs. Small scattered solid pulmonary nodules. Reference nodule of the left upper lobe measuring 3 mm on series 6, image 43. Musculoskeletal: No chest wall mass or suspicious bone lesions identified. CT ABDOMEN FINDINGS Hepatobiliary: Scattered low-attenuation liver lesions, largest are compatible with simple cysts, others are too small to completely characterize. Status post cholecystectomy. No biliary dilatation. Pancreas: Unremarkable. No pancreatic ductal dilatation or surrounding inflammatory changes. Spleen: Normal in size without focal abnormality. Adrenals/Urinary Tract: Bilateral adrenal glands are unremarkable. No hydronephrosis or nephrolithiasis. Small bilateral low-attenuation renal lesions which are too small to completely characterize but likely simple cysts, no further follow-up imaging is recommended. Stomach/Bowel:  Postsurgical changes of prior Nissen fundoplication. Collection containing air and contrast, measuring approximately 2.1 x 1.6 x 2.7 cm, likely arising from the distal esophagus and along the right aspect of the distal esophagus/wrap with marked adjacent inflammatory changes. Findings are favored to be due to contained leak, communication with the wrap can not be definitely excluded. Visualized small and large bowel are normal in appearance. Vascular/Lymphatic: Aortic atherosclerosis. No enlarged abdominal or pelvic lymph nodes. Other: No free fluid or free air seen in the abdomen or pelvis. Musculoskeletal: No acute or significant osseous findings. IMPRESSION: 1. Postsurgical changes of prior Nissen fundoplication with a collection containing air and contrast likely arising from the distal esophagus and located along the right aspect of the distal esophagus/wrap. Marked adjacent inflammatory changes. Findings are compatible with a contained leak, communication with the wrap can not be definitely excluded. 2. Dilated ascending thoracic aorta, measuring up  to 4.5 cm. Ascending thoracic aortic aneurysm. Recommend semi-annual imaging followup by CTA or MRA and referral to cardiothoracic surgery if not already obtained. This recommendation follows 2010. ACCF/AHA/AATS/ACR/ASA/SCA/SCAI/SIR/STS/SVM Guidelines for the Diagnosis and Management of Patients With Thoracic Aortic Disease. Circulation. 2010; 121: X937-J696. Aortic aneurysm NOS (ICD10-I71.9) 3. Mild ground-glass opacities of the bilateral upper lobes, likely infectious or inflammatory 4. Small scattered solid pulmonary nodules, largest measures 3 mm. Non-contrast chest CT can be considered in 12 months if patient is high-risk. This recommendation follows the consensus statement: Guidelines for Management of Incidental Pulmonary Nodules Detected on CT Images: From the Fleischner Society 2017; Radiology 2017; 284:228-243. 5. Aortic Atherosclerosis (ICD10-I70.0) and  Emphysema (ICD10-J43.9). Critical Value/emergent results were called by telephone at the time of interpretation on 03/23/2022 at 4:19 pm to provider Deliah Goody (PA), who verbally acknowledged these results. Electronically Signed   By: Yetta Glassman M.D.   On: 03/23/2022 16:22   CT CHEST W CONTRAST  Result Date: 03/23/2022 CLINICAL DATA:  Follow-up upper GI EXAM: CT CHEST AND ABDOMEN WITH CONTRAST TECHNIQUE: Multidetector CT imaging of the chest and abdomen was performed following the standard protocol during bolus administration of intravenous contrast. RADIATION DOSE REDUCTION: This exam was performed according to the departmental dose-optimization program which includes automated exposure control, adjustment of the mA and/or kV according to patient size and/or use of iterative reconstruction technique. CONTRAST:  19m OMNIPAQUE IOHEXOL 300 MG/ML  SOLN COMPARISON:  Upper GI dated March 20, 2022 FINDINGS: CT CHEST FINDINGS Cardiovascular: Normal heart size. No pericardial effusion. Normal heart size. No pericardial effusion. Dilated ascending thoracic aorta measuring 4.5 x 4.4 cm. Mild atherosclerotic disease of the thoracic aorta. Mediastinum/Nodes: Patulous and fluid-filled esophagus. Thyroid is unremarkable. No pathologically enlarged lymph nodes seen in the chest. Lungs/Pleura: Central airways are patent. Moderate centrilobular emphysema. Mild right-greater-than-left ground-glass opacities of the upper lungs. Small scattered solid pulmonary nodules. Reference nodule of the left upper lobe measuring 3 mm on series 6, image 43. Musculoskeletal: No chest wall mass or suspicious bone lesions identified. CT ABDOMEN FINDINGS Hepatobiliary: Scattered low-attenuation liver lesions, largest are compatible with simple cysts, others are too small to completely characterize. Status post cholecystectomy. No biliary dilatation. Pancreas: Unremarkable. No pancreatic ductal dilatation or surrounding inflammatory  changes. Spleen: Normal in size without focal abnormality. Adrenals/Urinary Tract: Bilateral adrenal glands are unremarkable. No hydronephrosis or nephrolithiasis. Small bilateral low-attenuation renal lesions which are too small to completely characterize but likely simple cysts, no further follow-up imaging is recommended. Stomach/Bowel: Postsurgical changes of prior Nissen fundoplication. Collection containing air and contrast, measuring approximately 2.1 x 1.6 x 2.7 cm, likely arising from the distal esophagus and along the right aspect of the distal esophagus/wrap with marked adjacent inflammatory changes. Findings are favored to be due to contained leak, communication with the wrap can not be definitely excluded. Visualized small and large bowel are normal in appearance. Vascular/Lymphatic: Aortic atherosclerosis. No enlarged abdominal or pelvic lymph nodes. Other: No free fluid or free air seen in the abdomen or pelvis. Musculoskeletal: No acute or significant osseous findings. IMPRESSION: 1. Postsurgical changes of prior Nissen fundoplication with a collection containing air and contrast likely arising from the distal esophagus and located along the right aspect of the distal esophagus/wrap. Marked adjacent inflammatory changes. Findings are compatible with a contained leak, communication with the wrap can not be definitely excluded. 2. Dilated ascending thoracic aorta, measuring up to 4.5 cm. Ascending thoracic aortic aneurysm. Recommend semi-annual imaging followup by CTA or MRA and referral  to cardiothoracic surgery if not already obtained. This recommendation follows 2010. ACCF/AHA/AATS/ACR/ASA/SCA/SCAI/SIR/STS/SVM Guidelines for the Diagnosis and Management of Patients With Thoracic Aortic Disease. Circulation. 2010; 121: Q333-L456. Aortic aneurysm NOS (ICD10-I71.9) 3. Mild ground-glass opacities of the bilateral upper lobes, likely infectious or inflammatory 4. Small scattered solid pulmonary nodules,  largest measures 3 mm. Non-contrast chest CT can be considered in 12 months if patient is high-risk. This recommendation follows the consensus statement: Guidelines for Management of Incidental Pulmonary Nodules Detected on CT Images: From the Fleischner Society 2017; Radiology 2017; 284:228-243. 5. Aortic Atherosclerosis (ICD10-I70.0) and Emphysema (ICD10-J43.9). Critical Value/emergent results were called by telephone at the time of interpretation on 03/23/2022 at 4:19 pm to provider Deliah Goody (PA), who verbally acknowledged these results. Electronically Signed   By: Yetta Glassman M.D.   On: 03/23/2022 16:22    Impression/Plan: Solid food dysphagia due to a contained distal esophageal perforation that is unclear if it is in the fundoplication or not. Needs an endoscopy to further evaluate leak and determine if location is narrow enough to hold an esophageal stent in place. If so then and esophageal stent will need to be placed in the near future by Dr. Watt Climes. IV Abx. Bowel rest. IVFs. Supportive care. EGD tomorrow at 2pm. Discussed with Dr. Redmond Pulling.     LOS: 0 days   [name]  03/24/2022, 3:17 PM  Questions please call [phone]

## 2022-03-24 NOTE — ED Triage Notes (Signed)
Pt arrived POV from her GI doctor after having a CT done yesterday that showed an esophogeal perforation. Pt has been having difficulty swallowing since February after having a hiatal hernia repair.

## 2022-03-24 NOTE — H&P (Addendum)
Julie Day  Julie Day [birth year]  [id number].    Requesting MD: Oneal Deputy Chief Complaint/Reason for Consult: contained esophageal perforation  HPI:  Julie Day is a 76 y.o. female who underwent laparoscopic paraesophageal hernia repair, laparoscopic Nissen fundoplication, insertion of mesh, insertion of gastrostomy tube on 09/08/21 by Dr. Kieth Brightly. States that she has had issues with tolerating PO since surgery. She has been followed by [facility] as an outpatient and reports her last EGD with esophageal dilatation was in June of this year; this did not improve her symptoms. She drinks one Ensure daily and takes 3-4 bites of soft meals, but still vomits most days. She also reports having a lot of burping. Denies abdominal pain, fever, or chills. Take pantoprazole and sucralfate without benefit. She has last 20-25lb since surgery.  She underwent UGI 8/18 which prompted ordering an outpatient CT c/a/p. This reported this morning with a collection containing air and contrast likely arising from the distal esophagus and located along the right aspect of the distal esophagus/wrap, marked adjacent inflammatory changes consistent with a contained leak, communication with the wrap can not be definitely excluded. She was advised to come to the ED by her gastroenterologist.  Anticoagulants: none Smokes <1 PPD Denies alcohol or illicit drug use   Family History  Problem Relation Age of Onset   Hypertension Mother    Hypertension Sister     Past Medical History:  Diagnosis Date   Allergic rhinitis    Anginal pain ([place])    Dx as GERD   Atypical mole 09/23/2017   Left Inner Thigh-Mild   Atypical mole 09/23/2017   Right Axilla-Mild   Atypical mole 09/05/2015   Center Abdomen-Severe (clear) (Dr. Nevada Crane)   Atypical mole 04/14/2019   Right Thigh-Solar Lentigo   Chronic cystitis    Degenerative disc disease    GERD (gastroesophageal reflux disease)     Hemangioma of liver    History of hiatal hernia 2017   Hypertension    SCC (squamous cell carcinoma) 09/05/2015   Left Dorsal Hand-Well Diff Keratoacanthoma (Dr. Nevada Crane) (free)   SCC (squamous cell carcinoma) 04/14/2019   Left Outer Shin-Keratoacanthoma (treatment after biopsy)   SCC (squamous cell carcinoma) 07/21/2019   Right Thigh-Keratoacanthoma (treatment Fluorouracil Cream)   Squamous cell carcinoma of skin 09/23/2017   Right Upper Shin-Well Diff (Cx3,5FU)    Past Surgical History:  Procedure Laterality Date   ABDOMINAL HYSTERECTOMY     CHOLECYSTECTOMY     COLON RESECTION N/A 05/31/2015   Procedure: LAPAROSCOPIC REPAIR OF HIATAL HERNIA  AND LAPAROSCOPIC NISSEN FUNDOPLICATION;  Surgeon: Fanny Skates, MD;  Location: [facility];  Service: General;  Laterality: N/A;   ESOPHAGEAL MANOMETRY N/A 02/27/2015   Procedure: ESOPHAGEAL MANOMETRY (EM);  Surgeon: Garlan Fair, MD;  Location: [facility];  Service: Endoscopy;  Laterality: N/A;   ESOPHAGOGASTRODUODENOSCOPY (EGD) WITH PROPOFOL N/A 02/26/2015   Procedure: ESOPHAGOGASTRODUODENOSCOPY (EGD) WITH PROPOFOL;  Surgeon: Garlan Fair, MD;  Location: [facility];  Service: Endoscopy;  Laterality: N/A;   HIATAL HERNIA REPAIR  05/31/2015   HIATAL HERNIA REPAIR N/A 09/08/2021   Procedure: LAPAROSCOPIC REVISION HIATAL HERNIA REPAIR WITH FUNDOPLICATION AND MESH;  Surgeon: Kinsinger, Arta Bruce, MD;  Location: [facility];  Service: General;  Laterality: N/A;   LAPAROSCOPIC INSERTION GASTROSTOMY TUBE N/A 09/08/2021   Procedure: LAPAROSCOPIC INSERTION GASTROSTOMY TUBE;  Surgeon: Mickeal Skinner, MD;  Location: [facility];  Service: General;  Laterality: N/A;  18FR   TOTAL KNEE ARTHROPLASTY Right 10/03/2019  Procedure: RIGHT TOTAL KNEE ARTHROPLASTY;  Surgeon: Melrose Nakayama, MD;  Location: [facility];  Service: Orthopedics;  Laterality: Right;   WISDOM TOOTH EXTRACTION      Social History:  reports that she has been smoking cigarettes. She has never  used smokeless tobacco. She reports that she does not drink alcohol and does not use drugs.  Allergies: No Known Allergies  (Not in a hospital admission)   Prior to Admission medications   Medication Sig Start Date End Date Taking? Authorizing Provider  Calcium Carb-Cholecalciferol (CALCIUM 500 + D3 PO) Take 2 tablets by mouth daily.    [provider]  cetirizine (ZYRTEC) 10 MG tablet Take 10 mg by mouth daily. 01/07/22   [provider]  Cyanocobalamin (VITAMIN B12) 1000 MCG TBCR 1 tablet    [provider]  docusate sodium (COLACE) 100 MG capsule Take 100 mg by mouth at bedtime.    [provider]  famotidine (PEPCID) 40 MG tablet Take 40 mg by mouth at bedtime. 03/02/22   [provider]  fexofenadine (ALLEGRA) 180 MG tablet Take 180 mg by mouth daily.    [provider]  ibuprofen (ADVIL) 200 MG tablet Take by mouth.    [provider]  melatonin 5 MG TABS 1 tablet at bedtime as needed with food    [provider]  mupirocin ointment (BACTROBAN) 2 % Apply 1 application. topically daily. 10/15/21   Sheffield, Ronalee Red, PA-C  nitroGLYCERIN (NITROSTAT) 0.4 MG SL tablet Place 0.4 mg under the tongue every 5 (five) minutes as needed for chest pain.    [provider]  oxybutynin (DITROPAN-XL) 10 MG 24 hr tablet Take 10 mg by mouth at bedtime.    [provider]  pantoprazole (PROTONIX) 40 MG tablet Take 40 mg by mouth 2 (two) times daily.    [provider]  promethazine (PHENERGAN) 12.5 MG tablet Take 1-2 tablets (12.5-25 mg total) by mouth every 6 (six) hours as needed for nausea or vomiting. 10/03/19   Loni Dolly, PA-C  sucralfate (CARAFATE) 1 g tablet Take 1 g by mouth 2 (two) times daily. 01/28/22   [provider]  sucralfate (CARAFATE) 1 GM/10ML suspension Take 1 g by mouth 2 (two) times daily. 03/13/22   [provider]  triamterene-hydrochlorothiazide (MAXZIDE-25) 37.5-25 MG  per tablet Take 1 tablet by mouth daily.     [provider]  trimethoprim (TRIMPEX) 100 MG tablet Take 100 mg by mouth daily. 09/13/19   [provider]  vitamin B-12 (CYANOCOBALAMIN) 1000 MCG tablet Take 1,000 mcg by mouth 2 (two) times a week.    [provider]    Blood pressure 94/60, pulse 66, temperature 97.9 F (36.6 C), temperature source Oral, resp. rate 13, height '5\' 3"'$  (1.6 m), weight 54.4 kg, SpO2 98 %. Physical Exam: General: pleasant, WD/WN female who is laying in bed in NAD HEENT: head is normocephalic, atraumatic.  Sclera are noninjected.  Pupils equal and round.  Ears and nose without any masses or lesions.  Mouth is pink and moist. Dentition fair Heart: regular, rate, and rhythm.  Normal s1,s2. No obvious murmurs, gallops, or rubs noted.   Lungs: CTAB, no wheezes, rhonchi, or rales noted.  Respiratory effort nonlabored Abd: well healed lap incisions, soft, NT/ND, +BS, no masses, hernias, or organomegaly MS: no BUE/BLE edema, calves soft and nontender Skin: warm and dry with no masses, lesions, or rashes Psych: A&Ox4 with an appropriate affect Neuro: MAEs, no gross motor or  sensory deficits BUE/BLE  Results for orders placed or performed during the hospital encounter of 03/24/22 (from the past 48 hour(s))  CBC with Differential     Status: None   Collection Time: 03/24/22  8:59 AM  Result Value Ref Range   WBC 7.4 4.0 - 10.5 K/uL   RBC 4.41 3.87 - 5.11 MIL/uL   Hemoglobin 13.0 12.0 - 15.0 g/dL   HCT 38.8 36.0 - 46.0 %   MCV 88.0 80.0 - 100.0 fL   MCH 29.5 26.0 - 34.0 pg   MCHC 33.5 30.0 - 36.0 g/dL   RDW 14.3 11.5 - 15.5 %   Platelets 373 150 - 400 K/uL   nRBC 0.0 0.0 - 0.2 %   Neutrophils Relative % 63 %   Neutro Abs 4.7 1.7 - 7.7 K/uL   Lymphocytes Relative 27 %   Lymphs Abs 2.0 0.7 - 4.0 K/uL   Monocytes Relative 7 %   Monocytes Absolute 0.5 0.1 - 1.0 K/uL   Eosinophils Relative 2 %   Eosinophils Absolute 0.1 0.0 - 0.5 K/uL    Basophils Relative 1 %   Basophils Absolute 0.1 0.0 - 0.1 K/uL   Immature Granulocytes 0 %   Abs Immature Granulocytes 0.02 0.00 - 0.07 K/uL    Comment: Performed at [facility], [address], [place], [place] [address]  Comprehensive metabolic panel     Status: Abnormal   Collection Time: 03/24/22  8:59 AM  Result Value Ref Range   Sodium 140 135 - 145 mmol/L   Potassium 3.6 3.5 - 5.1 mmol/L   Chloride 103 98 - 111 mmol/L   CO2 26 22 - 32 mmol/L   Glucose, Bld 102 (H) 70 - 99 mg/dL    Comment: Glucose reference range applies only to samples taken after fasting for at least 8 hours.   BUN 6 (L) 8 - 23 mg/dL   Creatinine, Ser 0.87 0.44 - 1.00 mg/dL   Calcium 9.9 8.9 - 10.3 mg/dL   Total Protein 6.8 6.5 - 8.1 g/dL   Albumin 3.6 3.5 - 5.0 g/dL   AST 20 15 - 41 U/L   ALT 14 0 - 44 U/L   Alkaline Phosphatase 84 38 - 126 U/L   Total Bilirubin 0.5 0.3 - 1.2 mg/dL   GFR, Estimated >60 >60 mL/min    Comment: (Day) Calculated using the CKD-EPI Creatinine Equation (2021)    Anion gap 11 5 - 15    Comment: Performed at [facility] [address], [place], [place] [address]  Lactic acid, plasma     Status: None   Collection Time: 03/24/22  8:59 AM  Result Value Ref Range   Lactic Acid, Venous 1.3 0.5 - 1.9 mmol/L    Comment: Performed at [facility] [address], [place], [place] [address]  Lactic acid, plasma     Status: None   Collection Time: 03/24/22 10:20 AM  Result Value Ref Range   Lactic Acid, Venous 1.1 0.5 - 1.9 mmol/L    Comment: Performed at [facility] [address], [place], [place] [address]   CT ABDOMEN W CONTRAST  Result Date: 03/23/2022 CLINICAL DATA:  Follow-up upper GI EXAM: CT CHEST AND ABDOMEN WITH CONTRAST TECHNIQUE: Multidetector CT imaging of the chest and abdomen was performed following the standard protocol during bolus administration of intravenous contrast. RADIATION DOSE REDUCTION: This exam was performed according to the  departmental dose-optimization program which includes automated  exposure control, adjustment of the mA and/or kV according to patient size and/or use of iterative reconstruction technique. CONTRAST:  101m OMNIPAQUE IOHEXOL 300 MG/ML  SOLN COMPARISON:  Upper GI dated March 20, 2022 FINDINGS: CT CHEST FINDINGS Cardiovascular: Normal heart size. No pericardial effusion. Normal heart size. No pericardial effusion. Dilated ascending thoracic aorta measuring 4.5 x 4.4 cm. Mild atherosclerotic disease of the thoracic aorta. Mediastinum/Nodes: Patulous and fluid-filled esophagus. Thyroid is unremarkable. No pathologically enlarged lymph nodes seen in the chest. Lungs/Pleura: Central airways are patent. Moderate centrilobular emphysema. Mild right-greater-than-left ground-glass opacities of the upper lungs. Small scattered solid pulmonary nodules. Reference nodule of the left upper lobe measuring 3 mm on series 6, image 43. Musculoskeletal: No chest wall mass or suspicious bone lesions identified. CT ABDOMEN FINDINGS Hepatobiliary: Scattered low-attenuation liver lesions, largest are compatible with simple cysts, others are too small to completely characterize. Status post cholecystectomy. No biliary dilatation. Pancreas: Unremarkable. No pancreatic ductal dilatation or surrounding inflammatory changes. Spleen: Normal in size without focal abnormality. Adrenals/Urinary Tract: Bilateral adrenal glands are unremarkable. No hydronephrosis or nephrolithiasis. Small bilateral low-attenuation renal lesions which are too small to completely characterize but likely simple cysts, no further follow-up imaging is recommended. Stomach/Bowel: Postsurgical changes of prior Nissen fundoplication. Collection containing air and contrast, measuring approximately 2.1 x 1.6 x 2.7 cm, likely arising from the distal esophagus and along the right aspect of the distal esophagus/wrap with marked adjacent inflammatory changes. Findings are favored  to be due to contained leak, communication with the wrap can not be definitely excluded. Visualized small and large bowel are normal in appearance. Vascular/Lymphatic: Aortic atherosclerosis. No enlarged abdominal or pelvic lymph nodes. Other: No free fluid or free air seen in the abdomen or pelvis. Musculoskeletal: No acute or significant osseous findings. IMPRESSION: 1. Postsurgical changes of prior Nissen fundoplication with a collection containing air and contrast likely arising from the distal esophagus and located along the right aspect of the distal esophagus/wrap. Marked adjacent inflammatory changes. Findings are compatible with a contained leak, communication with the wrap can not be definitely excluded. 2. Dilated ascending thoracic aorta, measuring up to 4.5 cm. Ascending thoracic aortic aneurysm. Recommend semi-annual imaging followup by CTA or MRA and referral to cardiothoracic surgery if not already obtained. This recommendation follows 2010. ACCF/AHA/AATS/ACR/ASA/SCA/SCAI/SIR/STS/SVM Guidelines for the Diagnosis and Management of Patients With Thoracic Aortic Disease. Circulation. 2010; 121:: Q469-G295 Aortic aneurysm NOS (ICD10-I71.9) 3. Mild ground-glass opacities of the bilateral upper lobes, likely infectious or inflammatory 4. Small scattered solid pulmonary nodules, largest measures 3 mm. Non-contrast chest CT can be considered in 12 months if patient is high-risk. This recommendation follows the consensus statement: Guidelines for Management of Incidental Pulmonary Nodules Detected on CT Images: From the Fleischner Society 2017; Radiology 2017; 284:228-243. 5. Aortic Atherosclerosis (ICD10-I70.0) and Emphysema (ICD10-J43.9). Critical Value/emergent results were called by telephone at the time of interpretation on 03/23/2022 at 4:19 pm to provider TDeliah Goody(PA), who verbally acknowledged these results. Electronically Signed   By: LYetta GlassmanM.D.   On: 03/23/2022 16:22   CT CHEST W  CONTRAST  Result Date: 03/23/2022 CLINICAL DATA:  Follow-up upper GI EXAM: CT CHEST AND ABDOMEN WITH CONTRAST TECHNIQUE: Multidetector CT imaging of the chest and abdomen was performed following the standard protocol during bolus administration of intravenous contrast. RADIATION DOSE REDUCTION: This exam was performed according to the departmental dose-optimization program which includes automated exposure control, adjustment of the mA and/or kV according to patient size and/or use of iterative reconstruction  technique. CONTRAST:  67m OMNIPAQUE IOHEXOL 300 MG/ML  SOLN COMPARISON:  Upper GI dated March 20, 2022 FINDINGS: CT CHEST FINDINGS Cardiovascular: Normal heart size. No pericardial effusion. Normal heart size. No pericardial effusion. Dilated ascending thoracic aorta measuring 4.5 x 4.4 cm. Mild atherosclerotic disease of the thoracic aorta. Mediastinum/Nodes: Patulous and fluid-filled esophagus. Thyroid is unremarkable. No pathologically enlarged lymph nodes seen in the chest. Lungs/Pleura: Central airways are patent. Moderate centrilobular emphysema. Mild right-greater-than-left ground-glass opacities of the upper lungs. Small scattered solid pulmonary nodules. Reference nodule of the left upper lobe measuring 3 mm on series 6, image 43. Musculoskeletal: No chest wall mass or suspicious bone lesions identified. CT ABDOMEN FINDINGS Hepatobiliary: Scattered low-attenuation liver lesions, largest are compatible with simple cysts, others are too small to completely characterize. Status post cholecystectomy. No biliary dilatation. Pancreas: Unremarkable. No pancreatic ductal dilatation or surrounding inflammatory changes. Spleen: Normal in size without focal abnormality. Adrenals/Urinary Tract: Bilateral adrenal glands are unremarkable. No hydronephrosis or nephrolithiasis. Small bilateral low-attenuation renal lesions which are too small to completely characterize but likely simple cysts, no further follow-up  imaging is recommended. Stomach/Bowel: Postsurgical changes of prior Nissen fundoplication. Collection containing air and contrast, measuring approximately 2.1 x 1.6 x 2.7 cm, likely arising from the distal esophagus and along the right aspect of the distal esophagus/wrap with marked adjacent inflammatory changes. Findings are favored to be due to contained leak, communication with the wrap can not be definitely excluded. Visualized small and large bowel are normal in appearance. Vascular/Lymphatic: Aortic atherosclerosis. No enlarged abdominal or pelvic lymph nodes. Other: No free fluid or free air seen in the abdomen or pelvis. Musculoskeletal: No acute or significant osseous findings. IMPRESSION: 1. Postsurgical changes of prior Nissen fundoplication with a collection containing air and contrast likely arising from the distal esophagus and located along the right aspect of the distal esophagus/wrap. Marked adjacent inflammatory changes. Findings are compatible with a contained leak, communication with the wrap can not be definitely excluded. 2. Dilated ascending thoracic aorta, measuring up to 4.5 cm. Ascending thoracic aortic aneurysm. Recommend semi-annual imaging followup by CTA or MRA and referral to cardiothoracic surgery if not already obtained. This recommendation follows 2010. ACCF/AHA/AATS/ACR/ASA/SCA/SCAI/SIR/STS/SVM Guidelines for the Diagnosis and Management of Patients With Thoracic Aortic Disease. Circulation. 2010; 121:: M196-Q229 Aortic aneurysm NOS (ICD10-I71.9) 3. Mild ground-glass opacities of the bilateral upper lobes, likely infectious or inflammatory 4. Small scattered solid pulmonary nodules, largest measures 3 mm. Non-contrast chest CT can be considered in 12 months if patient is high-risk. This recommendation follows the consensus statement: Guidelines for Management of Incidental Pulmonary Nodules Detected on CT Images: From the Fleischner Society 2017; Radiology 2017; 284:228-243. 5.  Aortic Atherosclerosis (ICD10-I70.0) and Emphysema (ICD10-J43.9). Critical Value/emergent results were called by telephone at the time of interpretation on 03/23/2022 at 4:19 pm to provider TDeliah Goody(PA), who verbally acknowledged these results. Electronically Signed   By: LYetta GlassmanM.D.   On: 03/23/2022 16:22    Anti-infectives (From admission, onward)    Start     Dose/Rate Route Frequency Ordered Stop   03/24/22 1700  piperacillin-tazobactam (ZOSYN) IVPB 3.375 g        3.375 g 12.5 mL/hr over 240 Minutes Intravenous Every 8 hours 03/24/22 1029     03/24/22 0845  piperacillin-tazobactam (ZOSYN) IVPB 3.375 g        3.375 g 100 mL/hr over 30 Minutes Intravenous  Once 03/24/22 0844 03/24/22 1114        Assessment/Plan Contained distal esophageal  perforation Hx Nissen fundoplication with mesh 12/05/63 Dysphagia  - Patient is nontoxic appearing, WBC WNL, VSS, nontender on abdominal exam. No role for acute surgical intervention. - Admit to med-surg, keep NPO and continue IV antibiotics - Will reach out to [facility] to get results and images from last EGD in June 2023 - Likely will need surgery at some point, but does not need to be emergent. Check prealbumin tomorrow. Further recommendations per MD.  ID - zosyn VTE - SCDs, lovenox FEN - IVF, NPO Foley - none  HTN Tobacco abuse Ascending thoracic aortic aneurysm 4.5cm Small scattered pulmonary nodules  I reviewed ED provider notes, last 24 h vitals and pain scores, last 48 h intake and output, last 24 h labs and trends, and last 24 h imaging results   Wellington Hampshire, [facility] 03/24/2022, 11:41 AM Please see Amion for pager number during day hours 7:00am-4:30pm

## 2022-03-24 NOTE — ED Notes (Signed)
ED TO INPATIENT HANDOFF REPORT  ED Nurse Name and Phone #: Iona Coach Name/Age/Gender Julie Day 76 y.o. female Room/Bed: 034C/034C  Code Status   Code Status: Full Code  Home/SNF/Other Home Patient oriented to: self, place, time, and situation Is this baseline? Yes   Triage Complete: Triage complete  Chief Complaint Esophageal perforation [K22.3]  Triage Note Pt arrived POV from her GI doctor after having a CT done yesterday that showed an esophogeal perforation. Pt has been having difficulty swallowing since February after having a hiatal hernia repair.    Allergies Allergies  Allergen Reactions   Trexall [Methotrexate] Other (See Comments)    Hair loss Fatigue    Prevnar 13 [Pneumococcal 13-Val Conj Vacc] Itching, Swelling and Rash    Redness around injection site Swelling around injection site Fever     Level of Care/Admitting Diagnosis ED Disposition     ED Disposition  Admit   Condition  --   [place]: [facility] [100100]  Level of Care: Med-Surg [16]  May admit patient to [facility] or [facility] if equivalent level of care is available:: No  Covid Evaluation: Asymptomatic - no recent exposure (last 10 days) testing not required  Diagnosis: Esophageal perforation [161096]  Admitting Physician: Peterson, Kamas  Attending Physician: Ocean Breeze, MD [[id number]]  Certification:: I certify this patient will need inpatient services for at least 2 midnights  Estimated Length of Stay: 4          B Medical/Surgery History Past Medical History:  Diagnosis Date   Allergic rhinitis    Anginal pain ([place])    Dx as GERD   Atypical mole 09/23/2017   Left Inner Thigh-Mild   Atypical mole 09/23/2017   Right Axilla-Mild   Atypical mole 09/05/2015   Center Abdomen-Severe (clear) (Dr. [place] Crane)   Atypical mole 04/14/2019   Right Thigh-Solar Lentigo   Chronic cystitis    Degenerative disc disease    GERD (gastroesophageal reflux disease)     Hemangioma of liver    History of hiatal hernia 2017   Hypertension    SCC (squamous cell carcinoma) 09/05/2015   Left Dorsal Hand-Well Diff Keratoacanthoma (Dr. Nevada Crane) (free)   SCC (squamous cell carcinoma) 04/14/2019   Left Outer Shin-Keratoacanthoma (treatment after biopsy)   SCC (squamous cell carcinoma) 07/21/2019   Right Thigh-Keratoacanthoma (treatment Fluorouracil Cream)   Squamous cell carcinoma of skin 09/23/2017   Right Upper Shin-Well Diff (Cx3,5FU)   Past Surgical History:  Procedure Laterality Date   ABDOMINAL HYSTERECTOMY     CHOLECYSTECTOMY     COLON RESECTION N/A 05/31/2015   Procedure: LAPAROSCOPIC REPAIR OF HIATAL HERNIA  AND LAPAROSCOPIC NISSEN FUNDOPLICATION;  Surgeon: Fanny Skates, MD;  Location: [facility];  Service: General;  Laterality: N/A;   ESOPHAGEAL MANOMETRY N/A 02/27/2015   Procedure: ESOPHAGEAL MANOMETRY (EM);  Surgeon: Garlan Fair, MD;  Location: [facility];  Service: Endoscopy;  Laterality: N/A;   ESOPHAGOGASTRODUODENOSCOPY (EGD) WITH PROPOFOL N/A 02/26/2015   Procedure: ESOPHAGOGASTRODUODENOSCOPY (EGD) WITH PROPOFOL;  Surgeon: Garlan Fair, MD;  Location: [facility];  Service: Endoscopy;  Laterality: N/A;   HIATAL HERNIA REPAIR  05/31/2015   HIATAL HERNIA REPAIR N/A 09/08/2021   Procedure: LAPAROSCOPIC REVISION HIATAL HERNIA REPAIR WITH FUNDOPLICATION AND MESH;  Surgeon: Kinsinger, Arta Bruce, MD;  Location: [facility];  Service: General;  Laterality: N/A;   LAPAROSCOPIC INSERTION GASTROSTOMY TUBE N/A 09/08/2021   Procedure: LAPAROSCOPIC INSERTION GASTROSTOMY TUBE;  Surgeon: Mickeal Skinner, MD;  Location: [facility];  Service: General;  Laterality: N/A;  18FR   TOTAL KNEE ARTHROPLASTY Right 10/03/2019   Procedure: RIGHT TOTAL KNEE ARTHROPLASTY;  Surgeon: Melrose Nakayama, MD;  Location: [facility];  Service: Orthopedics;  Laterality: Right;   WISDOM TOOTH EXTRACTION       A IV Location/Drains/Wounds Patient Lines/Drains/Airways Status      Active Line/Drains/Airways     Name Placement date Placement time Site Days   Peripheral IV 03/24/22 20 G Right Antecubital 03/24/22  1024  Antecubital  less than 1   Gastrostomy/Enterostomy Gastrostomy 18 Fr. LUQ 09/08/21  0925  LUQ  197   Incision (Closed) 09/08/21 Abdomen 09/08/21  0956  -- 197   Incision - 5 Ports Abdomen 09/08/21  0800  -- 197            Intake/Output Last 24 hours No intake or output data in the 24 hours ending 03/24/22 1559  Labs/Imaging Results for orders placed or performed during the hospital encounter of 03/24/22 (from the past 48 hour(s))  CBC with Differential     Status: None   Collection Time: 03/24/22  8:59 AM  Result Value Ref Range   WBC 7.4 4.0 - 10.5 K/uL   RBC 4.41 3.87 - 5.11 MIL/uL   Hemoglobin 13.0 12.0 - 15.0 g/dL   HCT 38.8 36.0 - 46.0 %   MCV 88.0 80.0 - 100.0 fL   MCH 29.5 26.0 - 34.0 pg   MCHC 33.5 30.0 - 36.0 g/dL   RDW 14.3 11.5 - 15.5 %   Platelets 373 150 - 400 K/uL   nRBC 0.0 0.0 - 0.2 %   Neutrophils Relative % 63 %   Neutro Abs 4.7 1.7 - 7.7 K/uL   Lymphocytes Relative 27 %   Lymphs Abs 2.0 0.7 - 4.0 K/uL   Monocytes Relative 7 %   Monocytes Absolute 0.5 0.1 - 1.0 K/uL   Eosinophils Relative 2 %   Eosinophils Absolute 0.1 0.0 - 0.5 K/uL   Basophils Relative 1 %   Basophils Absolute 0.1 0.0 - 0.1 K/uL   Immature Granulocytes 0 %   Abs Immature Granulocytes 0.02 0.00 - 0.07 K/uL    Comment: Performed at [facility], [address] [place], [place] [address]  Comprehensive metabolic panel     Status: Abnormal   Collection Time: 03/24/22  8:59 AM  Result Value Ref Range   Sodium 140 135 - 145 mmol/L   Potassium 3.6 3.5 - 5.1 mmol/L   Chloride 103 98 - 111 mmol/L   CO2 26 22 - 32 mmol/L   Glucose, Bld 102 (H) 70 - 99 mg/dL    Comment: Glucose reference range applies only to samples taken after fasting for at least 8 hours.   BUN 6 (L) 8 - 23 mg/dL   Creatinine, Ser 0.87 0.44 - 1.00 mg/dL   Calcium 9.9 8.9  - 10.3 mg/dL   Total Protein 6.8 6.5 - 8.1 g/dL   Albumin 3.6 3.5 - 5.0 g/dL   AST 20 15 - 41 U/L   ALT 14 0 - 44 U/L   Alkaline Phosphatase 84 38 - 126 U/L   Total Bilirubin 0.5 0.3 - 1.2 mg/dL   GFR, Estimated >60 >60 mL/min    Comment: (NOTE) Calculated using the CKD-EPI Creatinine Equation (2021)    Anion gap 11 5 - 15    Comment: Performed at [facility] [address], [place], [place] [address]  Lactic acid,  plasma     Status: None   Collection Time: 03/24/22  8:59 AM  Result Value Ref Range   Lactic Acid, Venous 1.3 0.5 - 1.9 mmol/L    Comment: Performed at [facility] [address], [place], [place] [address]  Lactic acid, plasma     Status: None   Collection Time: 03/24/22 10:20 AM  Result Value Ref Range   Lactic Acid, Venous 1.1 0.5 - 1.9 mmol/L    Comment: Performed at [facility] [address], [place], [place] [address]  SARS Coronavirus 2 by RT PCR (hospital order, performed in [facility] hospital lab) *cepheid single result test* Anterior Nasal Swab     Status: None   Collection Time: 03/24/22 10:37 AM   Specimen: Anterior Nasal Swab  Result Value Ref Range   SARS Coronavirus 2 by RT PCR NEGATIVE NEGATIVE    Comment: (NOTE) SARS-CoV-2 target nucleic acids are NOT DETECTED.  The SARS-CoV-2 RNA is generally detectable in upper and lower respiratory specimens during the acute phase of infection. The lowest concentration of SARS-CoV-2 viral copies this assay can detect is 250 copies / mL. A negative result does not preclude SARS-CoV-2 infection and should not be used as the sole basis for treatment or other patient management decisions.  A negative result may occur with improper specimen collection / handling, submission of specimen other than nasopharyngeal swab, presence of viral mutation(s) within the areas targeted by this assay, and inadequate number of viral copies (<250 copies / mL). A negative result must be combined with  clinical observations, patient history, and epidemiological information.  Fact Sheet for Patients:   https://www.patel.info/  Fact Sheet for Healthcare Providers: https://hall.com/  This test is not yet approved or  cleared by the [place] FDA and has been authorized for detection and/or diagnosis of SARS-CoV-2 by FDA under an Emergency Use Authorization (EUA).  This EUA will remain in effect (meaning this test can be used) for the duration of the COVID-19 declaration under Section 564(b)(1) of the Act, 21 U.S.C. section 360bbb-3(b)(1), unless the authorization is terminated or revoked sooner.  Performed at [facility], [place] [address] [place], [place] [address]    CT ABDOMEN W CONTRAST  Result Date: 03/23/2022 CLINICAL DATA:  Follow-up upper GI EXAM: CT CHEST AND ABDOMEN WITH CONTRAST TECHNIQUE: Multidetector CT imaging of the chest and abdomen was performed following the standard protocol during bolus administration of intravenous contrast. RADIATION DOSE REDUCTION: This exam was performed according to the departmental dose-optimization program which includes automated exposure control, adjustment of the mA and/or kV according to patient size and/or use of iterative reconstruction technique. CONTRAST:  30m OMNIPAQUE IOHEXOL 300 MG/ML  SOLN COMPARISON:  Upper GI dated March 20, 2022 FINDINGS: CT CHEST FINDINGS Cardiovascular: Normal heart size. No pericardial effusion. Normal heart size. No pericardial effusion. Dilated ascending thoracic aorta measuring 4.5 x 4.4 cm. Mild atherosclerotic disease of the thoracic aorta. Mediastinum/Nodes: Patulous and fluid-filled esophagus. Thyroid is unremarkable. No pathologically enlarged lymph nodes seen in the chest. Lungs/Pleura: Central airways are patent. Moderate centrilobular emphysema. Mild right-greater-than-left ground-glass opacities of the upper lungs. Small scattered solid pulmonary  nodules. Reference nodule of the left upper lobe measuring 3 mm on series 6, image 43. Musculoskeletal: No chest wall mass or suspicious bone lesions identified. CT ABDOMEN FINDINGS Hepatobiliary: Scattered low-attenuation liver lesions, largest are compatible with simple cysts, others are too small to completely characterize. Status post cholecystectomy. No biliary dilatation. Pancreas: Unremarkable.  No pancreatic ductal dilatation or surrounding inflammatory changes. Spleen: Normal in size without focal abnormality. Adrenals/Urinary Tract: Bilateral adrenal glands are unremarkable. No hydronephrosis or nephrolithiasis. Small bilateral low-attenuation renal lesions which are too small to completely characterize but likely simple cysts, no further follow-up imaging is recommended. Stomach/Bowel: Postsurgical changes of prior Nissen fundoplication. Collection containing air and contrast, measuring approximately 2.1 x 1.6 x 2.7 cm, likely arising from the distal esophagus and along the right aspect of the distal esophagus/wrap with marked adjacent inflammatory changes. Findings are favored to be due to contained Day, communication with the wrap can not be definitely excluded. Visualized small and large bowel are normal in appearance. Vascular/Lymphatic: Aortic atherosclerosis. No enlarged abdominal or pelvic lymph nodes. Other: No free fluid or free air seen in the abdomen or pelvis. Musculoskeletal: No acute or significant osseous findings. IMPRESSION: 1. Postsurgical changes of prior Nissen fundoplication with a collection containing air and contrast likely arising from the distal esophagus and located along the right aspect of the distal esophagus/wrap. Marked adjacent inflammatory changes. Findings are compatible with a contained Day, communication with the wrap can not be definitely excluded. 2. Dilated ascending thoracic aorta, measuring up to 4.5 cm. Ascending thoracic aortic aneurysm. Recommend semi-annual  imaging followup by CTA or MRA and referral to cardiothoracic surgery if not already obtained. This recommendation follows 2010. ACCF/AHA/AATS/ACR/ASA/SCA/SCAI/SIR/STS/SVM Guidelines for the Diagnosis and Management of Patients With Thoracic Aortic Disease. Circulation. 2010; 121: S854-O270. Aortic aneurysm NOS (ICD10-I71.9) 3. Mild ground-glass opacities of the bilateral upper lobes, likely infectious or inflammatory 4. Small scattered solid pulmonary nodules, largest measures 3 mm. Non-contrast chest CT can be considered in 12 months if patient is high-risk. This recommendation follows the consensus statement: Guidelines for Management of Incidental Pulmonary Nodules Detected on CT Images: From the Fleischner Society 2017; Radiology 2017; 284:228-243. 5. Aortic Atherosclerosis (ICD10-I70.0) and Emphysema (ICD10-J43.9). Critical Value/emergent results were called by telephone at the time of interpretation on 03/23/2022 at 4:19 pm to provider Deliah Goody (PA), who verbally acknowledged these results. Electronically Signed   By: Yetta Glassman M.D.   On: 03/23/2022 16:22   CT CHEST W CONTRAST  Result Date: 03/23/2022 CLINICAL DATA:  Follow-up upper GI EXAM: CT CHEST AND ABDOMEN WITH CONTRAST TECHNIQUE: Multidetector CT imaging of the chest and abdomen was performed following the standard protocol during bolus administration of intravenous contrast. RADIATION DOSE REDUCTION: This exam was performed according to the departmental dose-optimization program which includes automated exposure control, adjustment of the mA and/or kV according to patient size and/or use of iterative reconstruction technique. CONTRAST:  41m OMNIPAQUE IOHEXOL 300 MG/ML  SOLN COMPARISON:  Upper GI dated March 20, 2022 FINDINGS: CT CHEST FINDINGS Cardiovascular: Normal heart size. No pericardial effusion. Normal heart size. No pericardial effusion. Dilated ascending thoracic aorta measuring 4.5 x 4.4 cm. Mild atherosclerotic disease of  the thoracic aorta. Mediastinum/Nodes: Patulous and fluid-filled esophagus. Thyroid is unremarkable. No pathologically enlarged lymph nodes seen in the chest. Lungs/Pleura: Central airways are patent. Moderate centrilobular emphysema. Mild right-greater-than-left ground-glass opacities of the upper lungs. Small scattered solid pulmonary nodules. Reference nodule of the left upper lobe measuring 3 mm on series 6, image 43. Musculoskeletal: No chest wall mass or suspicious bone lesions identified. CT ABDOMEN FINDINGS Hepatobiliary: Scattered low-attenuation liver lesions, largest are compatible with simple cysts, others are too small to completely characterize. Status post cholecystectomy. No biliary dilatation. Pancreas: Unremarkable. No pancreatic ductal dilatation or surrounding inflammatory changes. Spleen: Normal in size without focal abnormality. Adrenals/Urinary Tract:  Bilateral adrenal glands are unremarkable. No hydronephrosis or nephrolithiasis. Small bilateral low-attenuation renal lesions which are too small to completely characterize but likely simple cysts, no further follow-up imaging is recommended. Stomach/Bowel: Postsurgical changes of prior Nissen fundoplication. Collection containing air and contrast, measuring approximately 2.1 x 1.6 x 2.7 cm, likely arising from the distal esophagus and along the right aspect of the distal esophagus/wrap with marked adjacent inflammatory changes. Findings are favored to be due to contained Day, communication with the wrap can not be definitely excluded. Visualized small and large bowel are normal in appearance. Vascular/Lymphatic: Aortic atherosclerosis. No enlarged abdominal or pelvic lymph nodes. Other: No free fluid or free air seen in the abdomen or pelvis. Musculoskeletal: No acute or significant osseous findings. IMPRESSION: 1. Postsurgical changes of prior Nissen fundoplication with a collection containing air and contrast likely arising from the distal  esophagus and located along the right aspect of the distal esophagus/wrap. Marked adjacent inflammatory changes. Findings are compatible with a contained Day, communication with the wrap can not be definitely excluded. 2. Dilated ascending thoracic aorta, measuring up to 4.5 cm. Ascending thoracic aortic aneurysm. Recommend semi-annual imaging followup by CTA or MRA and referral to cardiothoracic surgery if not already obtained. This recommendation follows 2010. ACCF/AHA/AATS/ACR/ASA/SCA/SCAI/SIR/STS/SVM Guidelines for the Diagnosis and Management of Patients With Thoracic Aortic Disease. Circulation. 2010; 121: S505-L976. Aortic aneurysm NOS (ICD10-I71.9) 3. Mild ground-glass opacities of the bilateral upper lobes, likely infectious or inflammatory 4. Small scattered solid pulmonary nodules, largest measures 3 mm. Non-contrast chest CT can be considered in 12 months if patient is high-risk. This recommendation follows the consensus statement: Guidelines for Management of Incidental Pulmonary Nodules Detected on CT Images: From the Fleischner Society 2017; Radiology 2017; 284:228-243. 5. Aortic Atherosclerosis (ICD10-I70.0) and Emphysema (ICD10-J43.9). Critical Value/emergent results were called by telephone at the time of interpretation on 03/23/2022 at 4:19 pm to provider Deliah Goody (PA), who verbally acknowledged these results. Electronically Signed   By: Yetta Glassman M.D.   On: 03/23/2022 16:22    Pending Labs Unresulted Labs (From admission, onward)     Start     Ordered   03/31/22 0500  Creatinine, serum  (enoxaparin (LOVENOX)    CrCl >/= 30 ml/min)  Weekly,   R     Comments: while on enoxaparin therapy    03/24/22 1215   03/25/22 0500  CBC  Tomorrow morning,   R        03/24/22 1138   03/25/22 0500  Comprehensive metabolic panel  Tomorrow morning,   R        03/24/22 1138   03/25/22 0500  Prealbumin  Tomorrow morning,   R        03/24/22 1138   03/24/22 0842  Blood culture (routine x 2)   BLOOD CULTURE X 2,   R (with STAT occurrences)      03/24/22 0842            Vitals/Pain Today's Vitals   03/24/22 1430 03/24/22 1530 03/24/22 1559 03/24/22 1559  BP: 106/78 110/65    Pulse: 72 71    Resp: (!) 25 13    Temp:    97.9 F (36.6 C)  TempSrc:    Oral  SpO2: 96% 97%    Weight:      Height:      PainSc:   0-No pain 0-No pain    Isolation Precautions No active isolations  Medications Medications  piperacillin-tazobactam (ZOSYN) IVPB 3.375 g (has no administration in  time range)  enoxaparin (LOVENOX) injection 40 mg (40 mg Subcutaneous Given 03/24/22 1245)  dextrose 5 % and 0.45 % NaCl with KCl 20 mEq/L infusion ( Intravenous New Bag/Given 03/24/22 1259)  ondansetron (ZOFRAN-ODT) disintegrating tablet 4 mg (has no administration in time range)    Or  ondansetron (ZOFRAN) injection 4 mg (has no administration in time range)  pantoprazole (PROTONIX) injection 40 mg (has no administration in time range)  metoprolol tartrate (LOPRESSOR) injection 5 mg (has no administration in time range)  hydrALAZINE (APRESOLINE) injection 10 mg (has no administration in time range)  acetaminophen (TYLENOL) suppository 650 mg (has no administration in time range)  morphine (PF) 2 MG/ML injection 2-4 mg (has no administration in time range)  piperacillin-tazobactam (ZOSYN) IVPB 3.375 g (0 g Intravenous Stopped 03/24/22 1114)    Mobility walks Low fall risk   Focused Assessments     R Recommendations: See Admitting Provider Note  Report given to:   Additional Notes:

## 2022-03-24 NOTE — Consult Note (Signed)
Referring Provider: Dr. Redmond Pulling Primary Care Physician:  Lavone Orn, MD Primary Gastroenterologist:  Dr. Therisa Doyne  Reason for Consultation:  Esophageal Perforation   HPI: Julie Day is a 76 y.o. female who is s/p Nissen fundoplication in 4782 with laparoscopic revision of the hiatal hernia repair, laparoscopic paraesophageal hernia repair and placement of a G-tube in Feb 2023. Reports difficulty eating solid foods since surgery in Feb 2023. Daughter and husband help with the history and report recurrent vomiting when lying down to sleep and G-tube was removed several weeks postop. She continued to have daily issues eating where she would have intense belching and regurgitation with eating a few bites of food. Liquids would pass down ok but she would still have recurrent belching. In June she had an EGD with empiric dilation of the distal esophagus and GEJ with a TTS balloon to 20 mm. She feels like the dilation helped somewhat for 1 day. Her difficulty eating has worsened over the last month where she has only been able to tolerate liquids. Has lost 30 pounds since Feb and about 10 pounds in the last 2 weeks. UGI on 8/18 showed a contained collection of fluid concerning for a fistula vs contained distal esophageal leak. CT yesterday showed contained esophageal leak.  Past Medical History:  Diagnosis Date   Allergic rhinitis    Anginal pain ([place])    Dx as GERD   Atypical mole 09/23/2017   Left Inner Thigh-Mild   Atypical mole 09/23/2017   Right Axilla-Mild   Atypical mole 09/05/2015   Center Abdomen-Severe (clear) (Dr. [place] Crane)   Atypical mole 04/14/2019   Right Thigh-Solar Lentigo   Chronic cystitis    Degenerative disc disease    GERD (gastroesophageal reflux disease)    Hemangioma of liver    History of hiatal hernia 2017   Hypertension    SCC (squamous cell carcinoma) 09/05/2015   Left Dorsal Hand-Well Diff Keratoacanthoma (Dr. Nevada Crane) (free)   SCC (squamous cell carcinoma) 04/14/2019    Left Outer Shin-Keratoacanthoma (treatment after biopsy)   SCC (squamous cell carcinoma) 07/21/2019   Right Thigh-Keratoacanthoma (treatment Fluorouracil Cream)   Squamous cell carcinoma of skin 09/23/2017   Right Upper Shin-Well Diff (Cx3,5FU)    Past Surgical History:  Procedure Laterality Date   ABDOMINAL HYSTERECTOMY     CHOLECYSTECTOMY     COLON RESECTION N/A 05/31/2015   Procedure: LAPAROSCOPIC REPAIR OF HIATAL HERNIA  AND LAPAROSCOPIC NISSEN FUNDOPLICATION;  Surgeon: Fanny Skates, MD;  Location: [place];  Service: General;  Laterality: N/A;   ESOPHAGEAL MANOMETRY N/A 02/27/2015   Procedure: ESOPHAGEAL MANOMETRY (EM);  Surgeon: Garlan Fair, MD;  Location: [facility];  Service: Endoscopy;  Laterality: N/A;   ESOPHAGOGASTRODUODENOSCOPY (EGD) WITH PROPOFOL N/A 02/26/2015   Procedure: ESOPHAGOGASTRODUODENOSCOPY (EGD) WITH PROPOFOL;  Surgeon: Garlan Fair, MD;  Location: [facility];  Service: Endoscopy;  Laterality: N/A;   HIATAL HERNIA REPAIR  05/31/2015   HIATAL HERNIA REPAIR N/A 09/08/2021   Procedure: LAPAROSCOPIC REVISION HIATAL HERNIA REPAIR WITH FUNDOPLICATION AND MESH;  Surgeon: Kinsinger, Arta Bruce, MD;  Location: [facility];  Service: General;  Laterality: N/A;   LAPAROSCOPIC INSERTION GASTROSTOMY TUBE N/A 09/08/2021   Procedure: LAPAROSCOPIC INSERTION GASTROSTOMY TUBE;  Surgeon: Mickeal Skinner, MD;  Location: [facility];  Service: General;  Laterality: N/A;  18FR   TOTAL KNEE ARTHROPLASTY Right 10/03/2019   Procedure: RIGHT TOTAL KNEE ARTHROPLASTY;  Surgeon: Melrose Nakayama, MD;  Location: [facility];  Service: Orthopedics;  Laterality: Right;   WISDOM TOOTH EXTRACTION  Prior to Admission medications   Medication Sig Start Date End Date Taking? Authorizing Provider  acetaminophen (TYLENOL) 650 MG CR tablet Take 650 mg by mouth daily as needed for pain.   Yes [provider]  Calcium Carb-Cholecalciferol (CALCIUM + D3 PO) Take 1 tablet by mouth daily.   Yes  [provider]  cetirizine (ZYRTEC) 10 MG tablet Take 10 mg by mouth daily. 01/07/22  Yes [provider]  Cyanocobalamin (VITAMIN B-12 PO) Take 1 tablet by mouth daily.   Yes [provider]  MELATONIN PO Take 1 tablet by mouth at bedtime.   Yes [provider]  meloxicam (MOBIC) 15 MG tablet Take 15 mg by mouth daily as needed for pain (bursitis).   Yes [provider]  nitroGLYCERIN (NITROSTAT) 0.4 MG SL tablet Place 0.4 mg under the tongue every 5 (five) minutes as needed for chest pain.   Yes [provider]  pantoprazole (PROTONIX) 40 MG tablet Take 40 mg by mouth daily.   Yes [provider]  sucralfate (CARAFATE) 1 GM/10ML suspension Take 1 g by mouth 2 (two) times daily. 03/13/22  Yes [provider]  triamterene-hydrochlorothiazide (MAXZIDE-25) 37.5-25 MG per tablet Take 1 tablet by mouth daily.    Yes [provider]  mupirocin ointment (BACTROBAN) 2 % Apply 1 application. topically daily. Patient not taking: Reported on 03/24/2022 10/15/21   Warren Danes, PA-C    Scheduled Meds:  enoxaparin (LOVENOX) injection  40 mg Subcutaneous Q24H   pantoprazole (PROTONIX) IV  40 mg Intravenous QHS   Continuous Infusions:  dextrose 5 % and 0.45 % NaCl with KCl 20 mEq/L 75 mL/hr at 03/24/22 1259   piperacillin-tazobactam (ZOSYN)  IV     PRN Meds:.acetaminophen, hydrALAZINE, metoprolol tartrate, morphine injection, ondansetron **OR** ondansetron (ZOFRAN) IV  Allergies as of 03/24/2022 - Review Complete 03/24/2022  Allergen Reaction Noted   Trexall [methotrexate] Other (See Comments) 03/24/2022   Prevnar 13 [pneumococcal 13-val conj vacc] Itching, Swelling, and Rash 03/24/2022    Family History  Problem Relation Age of Onset   Hypertension Mother    Hypertension Sister     Social History   Socioeconomic History   Marital status: Married    Spouse name: Not on file   Number of children: Not on file    Years of education: Not on file   Highest education level: Not on file  Occupational History   Not on file  Tobacco Use   Smoking status: Some Days    Types: Cigarettes    Last attempt to quit: 12/20/2008    Years since quitting: 13.2   Smokeless tobacco: Never  Vaping Use   Vaping Use: Never used  Substance and Sexual Activity   Alcohol use: No   Drug use: No   Sexual activity: Not on file  Other Topics Concern   Not on file  Social History Narrative   Not on file   Social Determinants of Health   Financial Resource Strain: Not on file  Food Insecurity: Not on file  Transportation Needs: Not on file  Physical Activity: Not on file  Stress: Not on file  Social Connections: Not on file  Intimate Partner Violence: Not on file    Review of Systems: All negative except as stated above in HPI.  Physical Exam: Vital signs: Vitals:   03/24/22 1315 03/24/22 1430  BP: 105/68 106/78  Pulse: 74 72  Resp: (!) 26 (!) 25  Temp:    SpO2: 96%  96%  T 97.7   General:   Alert,  thin, pleasant and cooperative in NAD Head: normocephalic, atraumatic Eyes: anicteric sclera ENT: oropharynx clear Neck: supple, nontender Lungs:  Clear throughout to auscultation.   No wheezes, crackles, or rhonchi. No acute distress. Heart:  Regular rate and rhythm; no murmurs, clicks, rubs,  or gallops. Abdomen: epigastric tenderness with minimal guarding, soft, nondistended, +BS  Rectal:  Deferred Ext: no edema  GI:  Lab Results: Recent Labs    03/24/22 0859  WBC 7.4  HGB 13.0  HCT 38.8  PLT 373   BMET Recent Labs    03/24/22 0859  NA 140  K 3.6  CL 103  CO2 26  GLUCOSE 102*  BUN 6*  CREATININE 0.87  CALCIUM 9.9   LFT Recent Labs    03/24/22 0859  PROT 6.8  ALBUMIN 3.6  AST 20  ALT 14  ALKPHOS 84  BILITOT 0.5   PT/INR No results for input(s): "LABPROT", "INR" in the last 72 hours.   Studies/Results: CT ABDOMEN W CONTRAST  Result Date: 03/23/2022 CLINICAL DATA:   Follow-up upper GI EXAM: CT CHEST AND ABDOMEN WITH CONTRAST TECHNIQUE: Multidetector CT imaging of the chest and abdomen was performed following the standard protocol during bolus administration of intravenous contrast. RADIATION DOSE REDUCTION: This exam was performed according to the departmental dose-optimization program which includes automated exposure control, adjustment of the mA and/or kV according to patient size and/or use of iterative reconstruction technique. CONTRAST:  9m OMNIPAQUE IOHEXOL 300 MG/ML  SOLN COMPARISON:  Upper GI dated March 20, 2022 FINDINGS: CT CHEST FINDINGS Cardiovascular: Normal heart size. No pericardial effusion. Normal heart size. No pericardial effusion. Dilated ascending thoracic aorta measuring 4.5 x 4.4 cm. Mild atherosclerotic disease of the thoracic aorta. Mediastinum/Nodes: Patulous and fluid-filled esophagus. Thyroid is unremarkable. No pathologically enlarged lymph nodes seen in the chest. Lungs/Pleura: Central airways are patent. Moderate centrilobular emphysema. Mild right-greater-than-left ground-glass opacities of the upper lungs. Small scattered solid pulmonary nodules. Reference nodule of the left upper lobe measuring 3 mm on series 6, image 43. Musculoskeletal: No chest wall mass or suspicious bone lesions identified. CT ABDOMEN FINDINGS Hepatobiliary: Scattered low-attenuation liver lesions, largest are compatible with simple cysts, others are too small to completely characterize. Status post cholecystectomy. No biliary dilatation. Pancreas: Unremarkable. No pancreatic ductal dilatation or surrounding inflammatory changes. Spleen: Normal in size without focal abnormality. Adrenals/Urinary Tract: Bilateral adrenal glands are unremarkable. No hydronephrosis or nephrolithiasis. Small bilateral low-attenuation renal lesions which are too small to completely characterize but likely simple cysts, no further follow-up imaging is recommended. Stomach/Bowel:  Postsurgical changes of prior Nissen fundoplication. Collection containing air and contrast, measuring approximately 2.1 x 1.6 x 2.7 cm, likely arising from the distal esophagus and along the right aspect of the distal esophagus/wrap with marked adjacent inflammatory changes. Findings are favored to be due to contained leak, communication with the wrap can not be definitely excluded. Visualized small and large bowel are normal in appearance. Vascular/Lymphatic: Aortic atherosclerosis. No enlarged abdominal or pelvic lymph nodes. Other: No free fluid or free air seen in the abdomen or pelvis. Musculoskeletal: No acute or significant osseous findings. IMPRESSION: 1. Postsurgical changes of prior Nissen fundoplication with a collection containing air and contrast likely arising from the distal esophagus and located along the right aspect of the distal esophagus/wrap. Marked adjacent inflammatory changes. Findings are compatible with a contained leak, communication with the wrap can not be definitely excluded. 2. Dilated ascending thoracic aorta, measuring up  to 4.5 cm. Ascending thoracic aortic aneurysm. Recommend semi-annual imaging followup by CTA or MRA and referral to cardiothoracic surgery if not already obtained. This recommendation follows 2010. ACCF/AHA/AATS/ACR/ASA/SCA/SCAI/SIR/STS/SVM Guidelines for the Diagnosis and Management of Patients With Thoracic Aortic Disease. Circulation. 2010; 121: K440-N027. Aortic aneurysm NOS (ICD10-I71.9) 3. Mild ground-glass opacities of the bilateral upper lobes, likely infectious or inflammatory 4. Small scattered solid pulmonary nodules, largest measures 3 mm. Non-contrast chest CT can be considered in 12 months if patient is high-risk. This recommendation follows the consensus statement: Guidelines for Management of Incidental Pulmonary Nodules Detected on CT Images: From the Fleischner Society 2017; Radiology 2017; 284:228-243. 5. Aortic Atherosclerosis (ICD10-I70.0) and  Emphysema (ICD10-J43.9). Critical Value/emergent results were called by telephone at the time of interpretation on 03/23/2022 at 4:19 pm to provider Deliah Goody (PA), who verbally acknowledged these results. Electronically Signed   By: Yetta Glassman M.D.   On: 03/23/2022 16:22   CT CHEST W CONTRAST  Result Date: 03/23/2022 CLINICAL DATA:  Follow-up upper GI EXAM: CT CHEST AND ABDOMEN WITH CONTRAST TECHNIQUE: Multidetector CT imaging of the chest and abdomen was performed following the standard protocol during bolus administration of intravenous contrast. RADIATION DOSE REDUCTION: This exam was performed according to the departmental dose-optimization program which includes automated exposure control, adjustment of the mA and/or kV according to patient size and/or use of iterative reconstruction technique. CONTRAST:  48m OMNIPAQUE IOHEXOL 300 MG/ML  SOLN COMPARISON:  Upper GI dated March 20, 2022 FINDINGS: CT CHEST FINDINGS Cardiovascular: Normal heart size. No pericardial effusion. Normal heart size. No pericardial effusion. Dilated ascending thoracic aorta measuring 4.5 x 4.4 cm. Mild atherosclerotic disease of the thoracic aorta. Mediastinum/Nodes: Patulous and fluid-filled esophagus. Thyroid is unremarkable. No pathologically enlarged lymph nodes seen in the chest. Lungs/Pleura: Central airways are patent. Moderate centrilobular emphysema. Mild right-greater-than-left ground-glass opacities of the upper lungs. Small scattered solid pulmonary nodules. Reference nodule of the left upper lobe measuring 3 mm on series 6, image 43. Musculoskeletal: No chest wall mass or suspicious bone lesions identified. CT ABDOMEN FINDINGS Hepatobiliary: Scattered low-attenuation liver lesions, largest are compatible with simple cysts, others are too small to completely characterize. Status post cholecystectomy. No biliary dilatation. Pancreas: Unremarkable. No pancreatic ductal dilatation or surrounding inflammatory  changes. Spleen: Normal in size without focal abnormality. Adrenals/Urinary Tract: Bilateral adrenal glands are unremarkable. No hydronephrosis or nephrolithiasis. Small bilateral low-attenuation renal lesions which are too small to completely characterize but likely simple cysts, no further follow-up imaging is recommended. Stomach/Bowel: Postsurgical changes of prior Nissen fundoplication. Collection containing air and contrast, measuring approximately 2.1 x 1.6 x 2.7 cm, likely arising from the distal esophagus and along the right aspect of the distal esophagus/wrap with marked adjacent inflammatory changes. Findings are favored to be due to contained leak, communication with the wrap can not be definitely excluded. Visualized small and large bowel are normal in appearance. Vascular/Lymphatic: Aortic atherosclerosis. No enlarged abdominal or pelvic lymph nodes. Other: No free fluid or free air seen in the abdomen or pelvis. Musculoskeletal: No acute or significant osseous findings. IMPRESSION: 1. Postsurgical changes of prior Nissen fundoplication with a collection containing air and contrast likely arising from the distal esophagus and located along the right aspect of the distal esophagus/wrap. Marked adjacent inflammatory changes. Findings are compatible with a contained leak, communication with the wrap can not be definitely excluded. 2. Dilated ascending thoracic aorta, measuring up to 4.5 cm. Ascending thoracic aortic aneurysm. Recommend semi-annual imaging followup by CTA or MRA and referral  to cardiothoracic surgery if not already obtained. This recommendation follows 2010. ACCF/AHA/AATS/ACR/ASA/SCA/SCAI/SIR/STS/SVM Guidelines for the Diagnosis and Management of Patients With Thoracic Aortic Disease. Circulation. 2010; 121: V564-P329. Aortic aneurysm NOS (ICD10-I71.9) 3. Mild ground-glass opacities of the bilateral upper lobes, likely infectious or inflammatory 4. Small scattered solid pulmonary nodules,  largest measures 3 mm. Non-contrast chest CT can be considered in 12 months if patient is high-risk. This recommendation follows the consensus statement: Guidelines for Management of Incidental Pulmonary Nodules Detected on CT Images: From the Fleischner Society 2017; Radiology 2017; 284:228-243. 5. Aortic Atherosclerosis (ICD10-I70.0) and Emphysema (ICD10-J43.9). Critical Value/emergent results were called by telephone at the time of interpretation on 03/23/2022 at 4:19 pm to provider Deliah Goody (PA), who verbally acknowledged these results. Electronically Signed   By: Yetta Glassman M.D.   On: 03/23/2022 16:22    Impression/Plan: Solid food dysphagia due to a contained distal esophageal perforation that is unclear if it is in the fundoplication or not. Needs an endoscopy to further evaluate leak and determine if location is narrow enough to hold an esophageal stent in place. If so then and esophageal stent will need to be placed in the near future by Dr. Watt Climes. IV Abx. Bowel rest. IVFs. Supportive care. EGD tomorrow at 2pm. Discussed with Dr. Redmond Pulling.     LOS: 0 days   [name]  03/24/2022, 3:17 PM  Questions please call [phone]

## 2022-03-24 NOTE — ED Provider Notes (Signed)
[facility] EMERGENCY DEPARTMENT Provider Note   CSN: [id number] Arrival date & time: 03/24/22  4854     History  Chief Complaint  Patient presents with   Dysphagia    Julie Day is a 76 y.o. female.  76 year old female with a past medical history of GERD presents to the ED status post abnormal CT.  Patient has been followed by [facility], recent barium swallow test 2 days ago, CT abdomen obtained yesterday and sent to the ED for esophageal perforation.  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  She reports her symptoms have been exacerbated over the past month, and to daughter at the bedside patient has had weight loss of approximately 20 pounds in the last couple of months.She reports the difficulty with swallowing occurs with solids and not with liquids at this time, there is also foaming to her mouth along with increased belching.  Patient did have a EGD in the month of June and had her esophagus stretched per daughter at the bedside.  Denies any fever, no abdominal pain, no chest pain or shortness of breath.  The history is provided by the patient and medical records.       Home Medications Prior to Admission medications   Medication Sig Start Date End Date Taking? Authorizing Provider  Calcium Carb-Cholecalciferol (CALCIUM 500 + D3 PO) Take 2 tablets by mouth daily.    [provider]  cetirizine (ZYRTEC) 10 MG tablet Take 10 mg by mouth daily. 01/07/22   [provider]  Cyanocobalamin (VITAMIN B12) 1000 MCG TBCR 1 tablet    [provider]  docusate sodium (COLACE) 100 MG capsule Take 100 mg by mouth at bedtime.    [provider]  famotidine (PEPCID) 40 MG tablet Take 40 mg by mouth at bedtime. 03/02/22   [provider]  fexofenadine (ALLEGRA) 180 MG tablet Take 180 mg by mouth daily.     [provider]  ibuprofen (ADVIL) 200 MG tablet Take by mouth.    [provider]  melatonin 5 MG TABS 1 tablet at bedtime as needed with food    [provider]  mupirocin ointment (BACTROBAN) 2 % Apply 1 application. topically daily. 10/15/21   Sheffield, Ronalee Red, PA-C  nitroGLYCERIN (NITROSTAT) 0.4 MG SL tablet Place 0.4 mg under the tongue every 5 (five) minutes as needed for chest pain.    [provider]  oxybutynin (DITROPAN-XL) 10 MG 24 hr tablet Take 10 mg by mouth at bedtime.    [provider]  pantoprazole (PROTONIX) 40 MG tablet Take 40 mg by mouth 2 (two) times daily.    [provider]  promethazine (PHENERGAN) 12.5 MG tablet Take 1-2 tablets (12.5-25 mg total) by mouth every 6 (six) hours as needed for nausea or vomiting. 10/03/19   Loni Dolly, PA-C  sucralfate (CARAFATE) 1 g tablet Take 1 g by mouth 2 (two) times daily. 01/28/22   [provider]  sucralfate (CARAFATE) 1 GM/10ML suspension Take 1 g by mouth 2 (two) times daily. 03/13/22   [provider]  triamterene-hydrochlorothiazide (MAXZIDE-25) 37.5-25 MG per tablet Take 1 tablet by mouth daily.     [provider]  trimethoprim (TRIMPEX) 100 MG tablet Take 100 mg by mouth daily. 09/13/19   [provider]  vitamin B-12 (CYANOCOBALAMIN) 1000 MCG tablet Take 1,000 mcg  by mouth 2 (two) times a week.    [provider]      Allergies    Patient has no known allergies.    Review of Systems   Review of Systems  Constitutional:  Negative for chills and fever.  HENT:  Positive for trouble swallowing.   Respiratory:  Negative for shortness of breath.   Cardiovascular:  Negative for chest pain.  Gastrointestinal:  Positive for nausea. Negative for abdominal pain.  Genitourinary:  Negative for flank pain.  Musculoskeletal:  Negative for back pain.  Skin:  Negative for pallor, rash and wound.  Neurological:  Negative for  light-headedness.  All other systems reviewed and are negative.   Physical Exam Updated Vital Signs BP 109/71   Pulse 72   Temp 97.9 F (36.6 C) (Oral)   Resp 14   Ht '5\' 3"'$  (1.6 m)   Wt 54.4 kg   SpO2 97%   BMI 21.26 kg/m  Physical Exam  ED Results / Procedures / Treatments   Labs (all labs ordered are listed, but only abnormal results are displayed) Labs Reviewed  COMPREHENSIVE METABOLIC PANEL - Abnormal; Notable for the following components:      Result Value   Glucose, Bld 102 (*)    BUN 6 (*)    All other components within normal limits  CULTURE, BLOOD (ROUTINE X 2)  CULTURE, BLOOD (ROUTINE X 2)  SARS CORONAVIRUS 2 BY RT PCR  CBC WITH DIFFERENTIAL/PLATELET  LACTIC ACID, PLASMA  LACTIC ACID, PLASMA    EKG None  Radiology CT ABDOMEN W CONTRAST  Result Date: 03/23/2022 CLINICAL DATA:  Follow-up upper GI EXAM: CT CHEST AND ABDOMEN WITH CONTRAST TECHNIQUE: Multidetector CT imaging of the chest and abdomen was performed following the standard protocol during bolus administration of intravenous contrast. RADIATION DOSE REDUCTION: This exam was performed according to the departmental dose-optimization program which includes automated exposure control, adjustment of the mA and/or kV according to patient size and/or use of iterative reconstruction technique. CONTRAST:  1m OMNIPAQUE IOHEXOL 300 MG/ML  SOLN COMPARISON:  Upper GI dated March 20, 2022 FINDINGS: CT CHEST FINDINGS Cardiovascular: Normal heart size. No pericardial effusion. Normal heart size. No pericardial effusion. Dilated ascending thoracic aorta measuring 4.5 x 4.4 cm. Mild atherosclerotic disease of the thoracic aorta. Mediastinum/Nodes: Patulous and fluid-filled esophagus. Thyroid is unremarkable. No pathologically enlarged lymph nodes seen in the chest. Lungs/Pleura: Central airways are patent. Moderate centrilobular emphysema. Mild right-greater-than-left ground-glass opacities of the upper lungs. Small  scattered solid pulmonary nodules. Reference nodule of the left upper lobe measuring 3 mm on series 6, image 43. Musculoskeletal: No chest wall mass or suspicious bone lesions identified. CT ABDOMEN FINDINGS Hepatobiliary: Scattered low-attenuation liver lesions, largest are compatible with simple cysts, others are too small to completely characterize. Status post cholecystectomy. No biliary dilatation. Pancreas: Unremarkable. No pancreatic ductal dilatation or surrounding inflammatory changes. Spleen: Normal in size without focal abnormality. Adrenals/Urinary Tract: Bilateral adrenal glands are unremarkable. No hydronephrosis or nephrolithiasis. Small bilateral low-attenuation renal lesions which are too small to completely characterize but likely simple cysts, no further follow-up imaging is recommended. Stomach/Bowel: Postsurgical changes of prior Nissen fundoplication. Collection containing air and contrast, measuring approximately 2.1 x 1.6 x 2.7 cm, likely arising from the distal esophagus and along the right aspect of the distal esophagus/wrap with marked adjacent inflammatory changes. Findings are favored to be due to contained leak, communication with the wrap can not be definitely excluded. Visualized small and large bowel are normal in  appearance. Vascular/Lymphatic: Aortic atherosclerosis. No enlarged abdominal or pelvic lymph nodes. Other: No free fluid or free air seen in the abdomen or pelvis. Musculoskeletal: No acute or significant osseous findings. IMPRESSION: 1. Postsurgical changes of prior Nissen fundoplication with a collection containing air and contrast likely arising from the distal esophagus and located along the right aspect of the distal esophagus/wrap. Marked adjacent inflammatory changes. Findings are compatible with a contained leak, communication with the wrap can not be definitely excluded. 2. Dilated ascending thoracic aorta, measuring up to 4.5 cm. Ascending thoracic aortic  aneurysm. Recommend semi-annual imaging followup by CTA or MRA and referral to cardiothoracic surgery if not already obtained. This recommendation follows 2010. ACCF/AHA/AATS/ACR/ASA/SCA/SCAI/SIR/STS/SVM Guidelines for the Diagnosis and Management of Patients With Thoracic Aortic Disease. Circulation. 2010; 121: D322-G254. Aortic aneurysm NOS (ICD10-I71.9) 3. Mild ground-glass opacities of the bilateral upper lobes, likely infectious or inflammatory 4. Small scattered solid pulmonary nodules, largest measures 3 mm. Non-contrast chest CT can be considered in 12 months if patient is high-risk. This recommendation follows the consensus statement: Guidelines for Management of Incidental Pulmonary Nodules Detected on CT Images: From the Fleischner Society 2017; Radiology 2017; 284:228-243. 5. Aortic Atherosclerosis (ICD10-I70.0) and Emphysema (ICD10-J43.9). Critical Value/emergent results were called by telephone at the time of interpretation on 03/23/2022 at 4:19 pm to provider Deliah Goody (PA), who verbally acknowledged these results. Electronically Signed   By: Yetta Glassman M.D.   On: 03/23/2022 16:22   CT CHEST W CONTRAST  Result Date: 03/23/2022 CLINICAL DATA:  Follow-up upper GI EXAM: CT CHEST AND ABDOMEN WITH CONTRAST TECHNIQUE: Multidetector CT imaging of the chest and abdomen was performed following the standard protocol during bolus administration of intravenous contrast. RADIATION DOSE REDUCTION: This exam was performed according to the departmental dose-optimization program which includes automated exposure control, adjustment of the mA and/or kV according to patient size and/or use of iterative reconstruction technique. CONTRAST:  67m OMNIPAQUE IOHEXOL 300 MG/ML  SOLN COMPARISON:  Upper GI dated March 20, 2022 FINDINGS: CT CHEST FINDINGS Cardiovascular: Normal heart size. No pericardial effusion. Normal heart size. No pericardial effusion. Dilated ascending thoracic aorta measuring 4.5 x 4.4 cm.  Mild atherosclerotic disease of the thoracic aorta. Mediastinum/Nodes: Patulous and fluid-filled esophagus. Thyroid is unremarkable. No pathologically enlarged lymph nodes seen in the chest. Lungs/Pleura: Central airways are patent. Moderate centrilobular emphysema. Mild right-greater-than-left ground-glass opacities of the upper lungs. Small scattered solid pulmonary nodules. Reference nodule of the left upper lobe measuring 3 mm on series 6, image 43. Musculoskeletal: No chest wall mass or suspicious bone lesions identified. CT ABDOMEN FINDINGS Hepatobiliary: Scattered low-attenuation liver lesions, largest are compatible with simple cysts, others are too small to completely characterize. Status post cholecystectomy. No biliary dilatation. Pancreas: Unremarkable. No pancreatic ductal dilatation or surrounding inflammatory changes. Spleen: Normal in size without focal abnormality. Adrenals/Urinary Tract: Bilateral adrenal glands are unremarkable. No hydronephrosis or nephrolithiasis. Small bilateral low-attenuation renal lesions which are too small to completely characterize but likely simple cysts, no further follow-up imaging is recommended. Stomach/Bowel: Postsurgical changes of prior Nissen fundoplication. Collection containing air and contrast, measuring approximately 2.1 x 1.6 x 2.7 cm, likely arising from the distal esophagus and along the right aspect of the distal esophagus/wrap with marked adjacent inflammatory changes. Findings are favored to be due to contained leak, communication with the wrap can not be definitely excluded. Visualized small and large bowel are normal in appearance. Vascular/Lymphatic: Aortic atherosclerosis. No enlarged abdominal or pelvic lymph nodes. Other: No free fluid or free  air seen in the abdomen or pelvis. Musculoskeletal: No acute or significant osseous findings. IMPRESSION: 1. Postsurgical changes of prior Nissen fundoplication with a collection containing air and contrast  likely arising from the distal esophagus and located along the right aspect of the distal esophagus/wrap. Marked adjacent inflammatory changes. Findings are compatible with a contained leak, communication with the wrap can not be definitely excluded. 2. Dilated ascending thoracic aorta, measuring up to 4.5 cm. Ascending thoracic aortic aneurysm. Recommend semi-annual imaging followup by CTA or MRA and referral to cardiothoracic surgery if not already obtained. This recommendation follows 2010. ACCF/AHA/AATS/ACR/ASA/SCA/SCAI/SIR/STS/SVM Guidelines for the Diagnosis and Management of Patients With Thoracic Aortic Disease. Circulation. 2010; 121: O130-Q657. Aortic aneurysm NOS (ICD10-I71.9) 3. Mild ground-glass opacities of the bilateral upper lobes, likely infectious or inflammatory 4. Small scattered solid pulmonary nodules, largest measures 3 mm. Non-contrast chest CT can be considered in 12 months if patient is high-risk. This recommendation follows the consensus statement: Guidelines for Management of Incidental Pulmonary Nodules Detected on CT Images: From the Fleischner Society 2017; Radiology 2017; 284:228-243. 5. Aortic Atherosclerosis (ICD10-I70.0) and Emphysema (ICD10-J43.9). Critical Value/emergent results were called by telephone at the time of interpretation on 03/23/2022 at 4:19 pm to provider Deliah Goody (PA), who verbally acknowledged these results. Electronically Signed   By: Yetta Glassman M.D.   On: 03/23/2022 16:22    Procedures Procedures    Medications Ordered in ED Medications  piperacillin-tazobactam (ZOSYN) IVPB 3.375 g (has no administration in time range)  enoxaparin (LOVENOX) injection 40 mg (has no administration in time range)  dextrose 5 % and 0.45 % NaCl with KCl 20 mEq/L infusion (has no administration in time range)  ondansetron (ZOFRAN-ODT) disintegrating tablet 4 mg (has no administration in time range)    Or  ondansetron (ZOFRAN) injection 4 mg (has no administration  in time range)  pantoprazole (PROTONIX) injection 40 mg (has no administration in time range)  metoprolol tartrate (LOPRESSOR) injection 5 mg (has no administration in time range)  hydrALAZINE (APRESOLINE) injection 10 mg (has no administration in time range)  acetaminophen (TYLENOL) suppository 650 mg (has no administration in time range)  morphine (PF) 2 MG/ML injection 2-4 mg (has no administration in time range)  piperacillin-tazobactam (ZOSYN) IVPB 3.375 g (0 g Intravenous Stopped 03/24/22 1114)    ED Course/ Medical Decision Making/ A&P                           Medical Decision Making Risk Prescription drug management. Decision regarding hospitalization.   This patient presents to the ED for concern of abnormal test results, this involves a number of treatment options, and is a complaint that carries with it a high risk of complications and morbidity.  Here with abnormal CT which showed esophageal perforation although contained.    Co morbidities: Discussed in HPI   Brief History:  76 year old with a prior history of GERD under the care of [facility] here due to abnormal test results such as CT which showed contained esophageal perforation.   EMR reviewed including pt PMHx, past surgical history and past visits to ER.   See HPI for more details   Lab Tests:  I ordered and independently interpreted labs.  The pertinent results include:    I personally reviewed all laboratory work and imaging. Metabolic panel without any acute abnormality specifically kidney function within normal limits and no significant electrolyte abnormalities. CBC without leukocytosis or significant anemia.   Imaging Studies:  CT Chest showed:  1. Postsurgical changes of prior Nissen fundoplication with a collection containing air and contrast likely arising from the distal esophagus and located along the right aspect of the distal esophagus/wrap. Marked adjacent inflammatory changes. Findings  are compatible with a contained leak, communication with the wrap can not be definitely excluded. 2. Dilated ascending thoracic aorta, measuring up to 4.5 cm. Ascending thoracic aortic aneurysm. Recommend semi-annual imaging followup by CTA or MRA and referral to cardiothoracic surgery if not already obtained. This recommendation follows 2010. ACCF/AHA/AATS/ACR/ASA/SCA/SCAI/SIR/STS/SVM Guidelines for the Diagnosis and Management of Patients With Thoracic Aortic Disease. Circulation. 2010; 121: O878-M767. Aortic aneurysm NOS (ICD10-I71.9) 3. Mild ground-glass opacities of the bilateral upper lobes, likely infectious or inflammatory 4. Small scattered solid pulmonary nodules, largest measures 3 mm. Non-contrast chest CT can be considered in 12 months if patient is high-risk. This recommendation follows the consensus statement: Guidelines for Management of Incidental Pulmonary Nodules Detected on CT Images: From the Fleischner Society 2017; Radiology 2017; 284:228-243. 5. Aortic Atherosclerosis (ICD10-I70.0) and Emphysema (ICD10-J43.9).  Medicines ordered:  I ordered medication including zosyn  for intraabdominal infection Reevaluation of the patient after these medicines showed that the patient stayed the same I have reviewed the patients home medicines and have made adjustments as needed  Consults:  I requested consultation with general surgery Brooke PA,  and discussed lab and imaging findings as well as pertinent plan - they will evaluate patient while in the ED.   Reevaluation:  After the interventions noted above I re-evaluated patient and found that they have :stayed the same   Social Determinants of Health:  The patient's social determinants of health were a factor in the care of this patient    Problem List / ED Course:  Patient here with underlying history of reflux, prior esophageal stretching in the month of June followed by [facility] with recent CT which showed  esophageal contained perforation.  Labs are benign, is afebrile, with stable vital signs.  Sosan was ordered for patient.  General surgery consult placed.   Dispostion:  After consideration of the diagnostic results and the patients response to treatment, I feel that the patent would benefit from admission for further management.     Portions of this note were generated with [employer]. Dictation errors may occur despite best attempts at proofreading.   Final Clinical Impression(s) / ED Diagnoses Final diagnoses:  Esophageal perforation    Rx / DC Orders ED Discharge Orders     None         Janeece Fitting, PA-C 03/24/22 [place], McEwen, DO 03/24/22 1600

## 2022-03-25 ENCOUNTER — Inpatient Hospital Stay (HOSPITAL_COMMUNITY): Payer: Medicare HMO | Admitting: Anesthesiology

## 2022-03-25 ENCOUNTER — Encounter (HOSPITAL_COMMUNITY): Admission: EM | Disposition: A | Discharge status: Home / Self Care | Payer: Self-pay | Source: Home / Self Care

## 2022-03-25 ENCOUNTER — Encounter (HOSPITAL_COMMUNITY): Payer: Self-pay

## 2022-03-25 DIAGNOSIS — I10 Essential (primary) hypertension: Secondary | ICD-10-CM | POA: Diagnosis not present

## 2022-03-25 DIAGNOSIS — K2289 Other specified disease of esophagus: Secondary | ICD-10-CM | POA: Diagnosis not present

## 2022-03-25 DIAGNOSIS — K2900 Acute gastritis without bleeding: Secondary | ICD-10-CM

## 2022-03-25 DIAGNOSIS — F17200 Nicotine dependence, unspecified, uncomplicated: Secondary | ICD-10-CM

## 2022-03-25 HISTORY — PX: ESOPHAGOGASTRODUODENOSCOPY: SHX5428

## 2022-03-25 LAB — CBC
HCT: 34 % — ABNORMAL LOW (ref 36.0–46.0)
Hemoglobin: 11.5 g/dL — ABNORMAL LOW (ref 12.0–15.0)
MCH: 29.7 pg (ref 26.0–34.0)
MCHC: 33.8 g/dL (ref 30.0–36.0)
MCV: 87.9 fL (ref 80.0–100.0)
Platelets: 299 10*3/uL (ref 150–400)
RBC: 3.87 MIL/uL (ref 3.87–5.11)
RDW: 14.4 % (ref 11.5–15.5)
WBC: 7.3 10*3/uL (ref 4.0–10.5)
nRBC: 0 % (ref 0.0–0.2)

## 2022-03-25 LAB — COMPREHENSIVE METABOLIC PANEL
ALT: 10 U/L (ref 0–44)
AST: 14 U/L — ABNORMAL LOW (ref 15–41)
Albumin: 3.1 g/dL — ABNORMAL LOW (ref 3.5–5.0)
Alkaline Phosphatase: 68 U/L (ref 38–126)
Anion gap: 10 (ref 5–15)
BUN: 8 mg/dL (ref 8–23)
CO2: 23 mmol/L (ref 22–32)
Calcium: 9.5 mg/dL (ref 8.9–10.3)
Chloride: 107 mmol/L (ref 98–111)
Creatinine, Ser: 0.93 mg/dL (ref 0.44–1.00)
GFR, Estimated: >60 mL/min (ref >60)
Glucose, Bld: 100 mg/dL — ABNORMAL HIGH (ref 70–99)
Potassium: 3.6 mmol/L (ref 3.5–5.1)
Sodium: 140 mmol/L (ref 135–145)
Total Bilirubin: 0.9 mg/dL (ref 0.3–1.2)
Total Protein: 5.7 g/dL — ABNORMAL LOW (ref 6.5–8.1)

## 2022-03-25 LAB — PREALBUMIN: Prealbumin: 15 mg/dL — ABNORMAL LOW (ref 18–38)

## 2022-03-25 SURGERY — EGD (ESOPHAGOGASTRODUODENOSCOPY)
Anesthesia: Monitor Anesthesia Care

## 2022-03-25 MED ORDER — GLYCOPYRROLATE 0.2 MG/ML IJ SOLN: INTRAMUSCULAR | Status: DC | PRN

## 2022-03-25 MED ORDER — LIDOCAINE 2% (20 MG/ML) 5 ML SYRINGE
INTRAMUSCULAR | Status: DC | PRN
  Administered 2022-03-25: 60 mg via INTRAVENOUS

## 2022-03-25 MED ORDER — NICOTINE 14 MG/24HR TD PT24
14.0000 mg | MEDICATED_PATCH | Freq: Every day | TRANSDERMAL | Status: DC
  Administered 2022-03-25 – 2022-03-27 (×3): 14 mg via TRANSDERMAL
  Filled 2022-03-25 (×3): qty 1

## 2022-03-25 MED ORDER — PROPOFOL 500 MG/50ML IV EMUL
INTRAVENOUS | Status: DC | PRN
  Administered 2022-03-25: 125 ug/kg/min via INTRAVENOUS

## 2022-03-25 MED ORDER — LACTATED RINGERS IV SOLN: INTRAVENOUS | Status: DC | PRN

## 2022-03-25 MED ORDER — PROPOFOL 10 MG/ML IV BOLUS
INTRAVENOUS | Status: DC | PRN
  Administered 2022-03-25: 20 mg via INTRAVENOUS

## 2022-03-25 NOTE — Anesthesia Preprocedure Evaluation (Signed)
Anesthesia Evaluation  Patient identified by MRN, date of birth, ID band Patient awake    Reviewed: Allergy & Precautions, NPO status , Patient's Chart, lab work & pertinent test results  Airway Mallampati: II  TM Distance: >3 FB Neck ROM: Full    Dental no notable dental hx. (+) Teeth Intact, Implants, Dental Advisory Given   Pulmonary Current Smoker, former smoker,    Pulmonary exam normal breath sounds clear to auscultation       Cardiovascular hypertension, Pt. on medications Normal cardiovascular exam Rhythm:Regular Rate:Normal     Neuro/Psych negative neurological ROS  negative psych ROS   GI/Hepatic Neg liver ROS, hiatal hernia, GERD  ,  Endo/Other    Renal/GU negative Renal ROSK+ 3.9 Cr 0.82     Musculoskeletal  (+) Arthritis , Osteoarthritis,    Abdominal   Peds  Hematology Hgb 13.9   Anesthesia Other Findings   Reproductive/Obstetrics                             Anesthesia Physical  Anesthesia Plan  ASA: 2  Anesthesia Plan: MAC   Post-op Pain Management:  Regional for Post-op pain   Induction: Intravenous  PONV Risk Score and Plan: 3 and Treatment may vary due to age or medical condition, Ondansetron, Propofol infusion and TIVA  Airway Management Planned: Natural Airway  Additional Equipment: None  Intra-op Plan:   Post-operative Plan:   Informed Consent: I have reviewed the patients History and Physical, chart, labs and discussed the procedure including the risks, benefits and alternatives for the proposed anesthesia with the patient or authorized representative who has indicated his/her understanding and acceptance.     Dental advisory given  Plan Discussed with: CRNA  Anesthesia Plan Comments:         Anesthesia Quick Evaluation

## 2022-03-25 NOTE — Interval H&P Note (Signed)
History and Physical Interval Note:  8/40/3979 5:36 PM  Julie Day  has presented today for surgery, with the diagnosis of esophageal perforation.  The various methods of treatment have been discussed with the patient and family. After consideration of risks, benefits and other options for treatment, the patient has consented to  Procedure(s): ESOPHAGOGASTRODUODENOSCOPY (EGD) (N/A) as a surgical intervention.  The patient's history has been reviewed, patient examined, no change in status, stable for surgery.  I have reviewed the patient's chart and labs.  Questions were answered to the patient's satisfaction.     [name]

## 2022-03-25 NOTE — Progress Notes (Signed)
Subjective: No complaints.  No pain.  Wants to go home if can have most of her work up outpatient.  ROS: See above, otherwise other systems negative  Objective: Vital signs in last 24 hours: Temp:  [97.7 F (36.5 C)-98.5 F (36.9 C)] 98.4 F (36.9 C) (08/23 0848) Pulse Rate:  [58-75] 58 (08/23 0848) Resp:  [10-26] 16 (08/23 0848) BP: (94-134)/(60-78) 114/66 (08/23 0848) SpO2:  [94 %-99 %] 99 % (08/23 0848) Last BM Date : 03/20/22  Intake/Output from previous day: No intake/output data recorded. Intake/Output this shift: No intake/output data recorded.  PE: Gen: NAD Heart: regular Lungs: CTAB Abd: soft, NT, ND  Lab Results:  Recent Labs    03/24/22 0859 03/25/22 0352  WBC 7.4 7.3  HGB 13.0 11.5*  HCT 38.8 34.0*  PLT 373 299   BMET Recent Labs    03/24/22 0859 03/25/22 0352  NA 140 140  K 3.6 3.6  CL 103 107  CO2 26 23  GLUCOSE 102* 100*  BUN 6* 8  CREATININE 0.87 0.93  CALCIUM 9.9 9.5   PT/INR No results for input(s): "LABPROT", "INR" in the last 72 hours. CMP     Component Value Date/Time   NA 140 03/25/2022 0352   K 3.6 03/25/2022 0352   CL 107 03/25/2022 0352   CO2 23 03/25/2022 0352   GLUCOSE 100 (H) 03/25/2022 0352   BUN 8 03/25/2022 0352   CREATININE 0.93 03/25/2022 0352   CALCIUM 9.5 03/25/2022 0352   PROT 5.7 (L) 03/25/2022 0352   ALBUMIN 3.1 (L) 03/25/2022 0352   AST 14 (L) 03/25/2022 0352   ALT 10 03/25/2022 0352   ALKPHOS 68 03/25/2022 0352   BILITOT 0.9 03/25/2022 0352   GFRNONAA >60 03/25/2022 0352   GFRAA >60 09/27/2019 1115   Lipase     Component Value Date/Time   LIPASE 27 03/24/2021 1845       Studies/Results: CT ABDOMEN W CONTRAST  Result Date: 03/23/2022 CLINICAL DATA:  Follow-up upper GI EXAM: CT CHEST AND ABDOMEN WITH CONTRAST TECHNIQUE: Multidetector CT imaging of the chest and abdomen was performed following the standard protocol during bolus administration of intravenous contrast. RADIATION DOSE  REDUCTION: This exam was performed according to the departmental dose-optimization program which includes automated exposure control, adjustment of the mA and/or kV according to patient size and/or use of iterative reconstruction technique. CONTRAST:  60m OMNIPAQUE IOHEXOL 300 MG/ML  SOLN COMPARISON:  Upper GI dated March 20, 2022 FINDINGS: CT CHEST FINDINGS Cardiovascular: Normal heart size. No pericardial effusion. Normal heart size. No pericardial effusion. Dilated ascending thoracic aorta measuring 4.5 x 4.4 cm. Mild atherosclerotic disease of the thoracic aorta. Mediastinum/Nodes: Patulous and fluid-filled esophagus. Thyroid is unremarkable. No pathologically enlarged lymph nodes seen in the chest. Lungs/Pleura: Central airways are patent. Moderate centrilobular emphysema. Mild right-greater-than-left ground-glass opacities of the upper lungs. Small scattered solid pulmonary nodules. Reference nodule of the left upper lobe measuring 3 mm on series 6, image 43. Musculoskeletal: No chest wall mass or suspicious bone lesions identified. CT ABDOMEN FINDINGS Hepatobiliary: Scattered low-attenuation liver lesions, largest are compatible with simple cysts, others are too small to completely characterize. Status post cholecystectomy. No biliary dilatation. Pancreas: Unremarkable. No pancreatic ductal dilatation or surrounding inflammatory changes. Spleen: Normal in size without focal abnormality. Adrenals/Urinary Tract: Bilateral adrenal glands are unremarkable. No hydronephrosis or nephrolithiasis. Small bilateral low-attenuation renal lesions which are too small to completely characterize but likely simple cysts, no further follow-up imaging is  recommended. Stomach/Bowel: Postsurgical changes of prior Nissen fundoplication. Collection containing air and contrast, measuring approximately 2.1 x 1.6 x 2.7 cm, likely arising from the distal esophagus and along the right aspect of the distal esophagus/wrap with marked  adjacent inflammatory changes. Findings are favored to be due to contained leak, communication with the wrap can not be definitely excluded. Visualized small and large bowel are normal in appearance. Vascular/Lymphatic: Aortic atherosclerosis. No enlarged abdominal or pelvic lymph nodes. Other: No free fluid or free air seen in the abdomen or pelvis. Musculoskeletal: No acute or significant osseous findings. IMPRESSION: 1. Postsurgical changes of prior Nissen fundoplication with a collection containing air and contrast likely arising from the distal esophagus and located along the right aspect of the distal esophagus/wrap. Marked adjacent inflammatory changes. Findings are compatible with a contained leak, communication with the wrap can not be definitely excluded. 2. Dilated ascending thoracic aorta, measuring up to 4.5 cm. Ascending thoracic aortic aneurysm. Recommend semi-annual imaging followup by CTA or MRA and referral to cardiothoracic surgery if not already obtained. This recommendation follows 2010. ACCF/AHA/AATS/ACR/ASA/SCA/SCAI/SIR/STS/SVM Guidelines for the Diagnosis and Management of Patients With Thoracic Aortic Disease. Circulation. 2010; 121: H299-M426. Aortic aneurysm NOS (ICD10-I71.9) 3. Mild ground-glass opacities of the bilateral upper lobes, likely infectious or inflammatory 4. Small scattered solid pulmonary nodules, largest measures 3 mm. Non-contrast chest CT can be considered in 12 months if patient is high-risk. This recommendation follows the consensus statement: Guidelines for Management of Incidental Pulmonary Nodules Detected on CT Images: From the Fleischner Society 2017; Radiology 2017; 284:228-243. 5. Aortic Atherosclerosis (ICD10-I70.0) and Emphysema (ICD10-J43.9). Critical Value/emergent results were called by telephone at the time of interpretation on 03/23/2022 at 4:19 pm to provider Deliah Goody ([place]), who verbally acknowledged these results. Electronically Signed   By: Yetta Glassman M.D.   On: 03/23/2022 16:22   CT CHEST W CONTRAST  Result Date: 03/23/2022 CLINICAL DATA:  Follow-up upper GI EXAM: CT CHEST AND ABDOMEN WITH CONTRAST TECHNIQUE: Multidetector CT imaging of the chest and abdomen was performed following the standard protocol during bolus administration of intravenous contrast. RADIATION DOSE REDUCTION: This exam was performed according to the departmental dose-optimization program which includes automated exposure control, adjustment of the mA and/or kV according to patient size and/or use of iterative reconstruction technique. CONTRAST:  34m OMNIPAQUE IOHEXOL 300 MG/ML  SOLN COMPARISON:  Upper GI dated March 20, 2022 FINDINGS: CT CHEST FINDINGS Cardiovascular: Normal heart size. No pericardial effusion. Normal heart size. No pericardial effusion. Dilated ascending thoracic aorta measuring 4.5 x 4.4 cm. Mild atherosclerotic disease of the thoracic aorta. Mediastinum/Nodes: Patulous and fluid-filled esophagus. Thyroid is unremarkable. No pathologically enlarged lymph nodes seen in the chest. Lungs/Pleura: Central airways are patent. Moderate centrilobular emphysema. Mild right-greater-than-left ground-glass opacities of the upper lungs. Small scattered solid pulmonary nodules. Reference nodule of the left upper lobe measuring 3 mm on series 6, image 43. Musculoskeletal: No chest wall mass or suspicious bone lesions identified. CT ABDOMEN FINDINGS Hepatobiliary: Scattered low-attenuation liver lesions, largest are compatible with simple cysts, others are too small to completely characterize. Status post cholecystectomy. No biliary dilatation. Pancreas: Unremarkable. No pancreatic ductal dilatation or surrounding inflammatory changes. Spleen: Normal in size without focal abnormality. Adrenals/Urinary Tract: Bilateral adrenal glands are unremarkable. No hydronephrosis or nephrolithiasis. Small bilateral low-attenuation renal lesions which are too small to completely  characterize but likely simple cysts, no further follow-up imaging is recommended. Stomach/Bowel: Postsurgical changes of prior Nissen fundoplication. Collection containing air and contrast, measuring approximately 2.1  x 1.6 x 2.7 cm, likely arising from the distal esophagus and along the right aspect of the distal esophagus/wrap with marked adjacent inflammatory changes. Findings are favored to be due to contained leak, communication with the wrap can not be definitely excluded. Visualized small and large bowel are normal in appearance. Vascular/Lymphatic: Aortic atherosclerosis. No enlarged abdominal or pelvic lymph nodes. Other: No free fluid or free air seen in the abdomen or pelvis. Musculoskeletal: No acute or significant osseous findings. IMPRESSION: 1. Postsurgical changes of prior Nissen fundoplication with a collection containing air and contrast likely arising from the distal esophagus and located along the right aspect of the distal esophagus/wrap. Marked adjacent inflammatory changes. Findings are compatible with a contained leak, communication with the wrap can not be definitely excluded. 2. Dilated ascending thoracic aorta, measuring up to 4.5 cm. Ascending thoracic aortic aneurysm. Recommend semi-annual imaging followup by CTA or MRA and referral to cardiothoracic surgery if not already obtained. This recommendation follows 2010. ACCF/AHA/AATS/ACR/ASA/SCA/SCAI/SIR/STS/SVM Guidelines for the Diagnosis and Management of Patients With Thoracic Aortic Disease. Circulation. 2010; 121: Q222-L798. Aortic aneurysm NOS (ICD10-I71.9) 3. Mild ground-glass opacities of the bilateral upper lobes, likely infectious or inflammatory 4. Small scattered solid pulmonary nodules, largest measures 3 mm. Non-contrast chest CT can be considered in 12 months if patient is high-risk. This recommendation follows the consensus statement: Guidelines for Management of Incidental Pulmonary Nodules Detected on CT Images: From the  Fleischner Society 2017; Radiology 2017; 284:228-243. 5. Aortic Atherosclerosis (ICD10-I70.0) and Emphysema (ICD10-J43.9). Critical Value/emergent results were called by telephone at the time of interpretation on 03/23/2022 at 4:19 pm to provider Deliah Goody (PA), who verbally acknowledged these results. Electronically Signed   By: Yetta Glassman M.D.   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 - ? Need for g-tube for nutrition - further surgical/interventional plans after endo today and discussion with surgeon at [facility] for further recommendations.  FEN - NPO/IVFs VTE - Lovenox ID - [email or web address]  I reviewed [facility] notes, last 24 h vitals and pain scores, last 48 h intake and output, last 24 h labs and trends, and last 24 h imaging results.   LOS: 1 day    Julie Day , [facility] 03/25/2022, 9:06 AM Please see Amion for pager number during day hours 7:00am-4:30pm or 7:00am -11:30am on weekends

## 2022-03-25 NOTE — Plan of Care (Signed)

## 2022-03-25 NOTE — Transfer of Care (Signed)
Immediate Anesthesia Transfer of Care Note  Patient: Julie Day  Procedure(s) Performed: ESOPHAGOGASTRODUODENOSCOPY (EGD)  Patient Location: PACU and Endoscopy Unit  Anesthesia Type:MAC  Level of Consciousness: awake, drowsy and patient cooperative  Airway & Oxygen Therapy: Patient Spontanous Breathing and Patient connected to nasal cannula oxygen  Post-op Assessment: Report given to RN and Post -op Vital signs reviewed and stable  Post vital signs: Reviewed and stable  Last Vitals:  Vitals Value Taken Time  BP    Temp    Pulse    Resp    SpO2      Last Pain:  Vitals:   03/25/22 1245  TempSrc: Temporal  PainSc: 0-No pain         Complications: No notable events documented.

## 2022-03-25 NOTE — Anesthesia Procedure Notes (Signed)
Procedure Name: MAC Date/Time: 03/25/2022 2:20 PM  Performed by: Lowella Dell, CRNAPre-anesthesia Checklist: Patient identified, Emergency Drugs available, Suction available, Patient being monitored and Timeout performed Patient Re-evaluated:Patient Re-evaluated prior to induction Oxygen Delivery Method: Nasal cannula Placement Confirmation: positive ETCO2 Dental Injury: Teeth and Oropharynx as per pre-operative assessment

## 2022-03-25 NOTE — Op Note (Signed)
[facility] Patient Name: Julie Day Procedure Date : 03/25/2022 MRN: [id number] Attending MD: [name] , MD Date of Birth: 1945-11-24 CSN: [id number] Age: 76 Admit Type: Inpatient Procedure:                Upper GI endoscopy Indications:              Dysphagia, Abnormal UGI series, Abnormal CT of the                            GI tract, Esophageal perforation/contained                            esophageal leak Providers:                [name], MD, Daryll Brod, RN,                            Frazier Richards, Technician Referring MD:             hospital team Medicines:                Propofol per Anesthesia, Monitored Anesthesia Care Complications:            No immediate complications. Estimated Blood Loss:     Estimated blood loss: none. Procedure:                Pre-Anesthesia Assessment:                           - Prior to the procedure, a History and Physical                            was performed, and patient medications and                            allergies were reviewed. The patient's tolerance of                            previous anesthesia was also reviewed. The risks                            and benefits of the procedure and the sedation                            options and risks were discussed with the patient.                            All questions were answered, and informed consent                            was obtained. Prior Anticoagulants: The patient has                            taken no previous anticoagulant or antiplatelet  agents. ASA Grade Assessment: II - A patient with                            mild systemic disease. After reviewing the risks                            and benefits, the patient was deemed in                            satisfactory condition to undergo the procedure.                           After obtaining informed consent, the endoscope was                             passed under direct vision. Throughout the                            procedure, the patient's blood pressure, pulse, and                            oxygen saturations were monitored continuously. The                            [id number]-H190 ([id number] Olympus endoscope was introduced                            through the mouth, and advanced to the second part                            of duodenum. The upper GI endoscopy was                            accomplished without difficulty. The patient                            tolerated the procedure well. Scope In: Scope Out: Findings:      Fluid was found in the distal esophagus.      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      The Z-line was regular and was found 42 cm from the incisors.      Esophageal mucosa opening at 38 cm from the incisors consistent with the       known esophageal leak. GEJ 42 cm from the incisors and gastric narrowing       in proximal stomach at 45 cm from the incisors.      Evidence of a Nissen fundoplication was found in the gastric fundus. The       wrap appeared intact. This was traversed.      Segmental moderate inflammation characterized by congestion (edema),       erosions and linear erosions was found in the gastric antrum.      The examined duodenum was normal. Impression:               - Fluid in the distal esophagus.                           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                           - Z-line regular, 42 cm from the incisors.                           - A Nissen fundoplication was found. The wrap                            appears intact.                           - Acute gastritis.                           - Normal examined duodenum.                           - No specimens collected. Recommendation:           - NPO.                           - Will discuss with colleagues whether an                            esophageal stent is feasible to allow adequate                             enteral nutrition. If not then will need a G-tube                            placed by IR for enteral nutrition. Procedure Code(s):        --- Professional ---                           [phone], Esophagogastroduodenoscopy, flexible,                            transoral; diagnostic, including collection of                            specimen(s) by brushing or washing, when performed                            (separate procedure) Diagnosis Code(s):        --- Professional ---                           R13.10, Dysphagia, unspecified                           R93.3, Abnormal findings on diagnostic imaging of                            other parts of digestive tract  K22.8, Other specified diseases of esophagus                           K29.00, Acute gastritis without bleeding                           Z98.890, Other specified postprocedural states CPT copyright 2019 American Medical Association. All rights reserved. The codes documented in this report are preliminary and upon coder review may  be revised to meet current compliance requirements. [name], MD 03/25/2022 3:01:31 PM This report has been signed electronically. Number of Addenda: 0

## 2022-03-26 ENCOUNTER — Encounter (HOSPITAL_COMMUNITY): Payer: Self-pay | Admitting: Gastroenterology

## 2022-03-26 ENCOUNTER — Other Ambulatory Visit (HOSPITAL_COMMUNITY): Payer: Medicare HMO

## 2022-03-26 ENCOUNTER — Encounter (HOSPITAL_COMMUNITY): Payer: Self-pay

## 2022-03-26 DIAGNOSIS — E43 Unspecified severe protein-calorie malnutrition: Secondary | ICD-10-CM | POA: Insufficient documentation

## 2022-03-26 MED ORDER — ACETAMINOPHEN 10 MG/ML IV SOLN
1000.0000 mg | Freq: Once | INTRAVENOUS | Status: AC
  Administered 2022-03-26: 1000 mg via INTRAVENOUS
  Filled 2022-03-26 (×2): qty 100

## 2022-03-26 MED ORDER — CEFAZOLIN SODIUM-DEXTROSE 2-4 GM/100ML-% IV SOLN: 2.0000 g | INTRAVENOUS | Status: AC

## 2022-03-26 NOTE — Plan of Care (Signed)

## 2022-03-26 NOTE — Progress Notes (Signed)
[facility] Progress Note  1 Day Post-Op  Subjective: CC-  Called patient's daughter, Luetta Nutting, on the phone during visit this morning. No complaints. [place] with proceeding with G tube.  Objective: Vital signs in last 24 hours: Temp:  [97.3 F (36.3 C)-99.1 F (37.3 C)] 99.1 F (37.3 C) (08/23 2041) Pulse Rate:  [57-71] 61 (08/23 2041) Resp:  [12-24] 18 (08/23 2041) BP: (106-146)/(59-83) 146/71 (08/23 2041) SpO2:  [95 %-100 %] 100 % (08/23 2041) Weight:  [54.4 kg] 54.4 kg (08/23 1245) Last BM Date : 03/20/22  Intake/Output from previous day: 08/23 0701 - 08/24 0700 In: 416 [I.V.:300; IV Piggyback:116] Out: -  Intake/Output this shift: No intake/output data recorded.  PE: Gen: Alert, NAD Lungs: rate and effort normal on room air Abd: soft, NT, ND  Lab Results:  Recent Labs    03/24/22 0859 03/25/22 0352  WBC 7.4 7.3  HGB 13.0 11.5*  HCT 38.8 34.0*  PLT 373 299   BMET Recent Labs    03/24/22 0859 03/25/22 0352  NA 140 140  K 3.6 3.6  CL 103 107  CO2 26 23  GLUCOSE 102* 100*  BUN 6* 8  CREATININE 0.87 0.93  CALCIUM 9.9 9.5   PT/INR No results for input(s): "LABPROT", "INR" in the last 72 hours. CMP     Component Value Date/Time   NA 140 03/25/2022 0352   K 3.6 03/25/2022 0352   CL 107 03/25/2022 0352   CO2 23 03/25/2022 0352   GLUCOSE 100 (H) 03/25/2022 0352   BUN 8 03/25/2022 0352   CREATININE 0.93 03/25/2022 0352   CALCIUM 9.5 03/25/2022 0352   PROT 5.7 (L) 03/25/2022 0352   ALBUMIN 3.1 (L) 03/25/2022 0352   AST 14 (L) 03/25/2022 0352   ALT 10 03/25/2022 0352   ALKPHOS 68 03/25/2022 0352   BILITOT 0.9 03/25/2022 0352   GFRNONAA >60 03/25/2022 0352   GFRAA >60 09/27/2019 1115   Lipase     Component Value Date/Time   LIPASE 27 03/24/2021 1845       Studies/Results: No results found.  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 Per GI, likely planning for endoscopic clip placement with Dr. Rush Landmark next week - Will ask IR to place G tube, then start bolus tube feedings - Once G tube in place and patient/family feel comfortable managing this she can likely be discharged with further work up/ management as outpatient    FEN - NPO/IVFs VTE - Lovenox ID - zosyn   I reviewed Consultant GI notes, last 24 h vitals and pain scores, last 48 h intake and output, last 24 h labs and trends, and last 24 h imaging results.    LOS: 2 days    Wellington Hampshire, [facility] 03/26/2022, 8:33 AM Please see Amion for pager number during day hours 7:00am-4:30pm

## 2022-03-26 NOTE — Progress Notes (Signed)
Initial Nutrition Assessment  DOCUMENTATION CODES:  Severe malnutrition in context of chronic illness  INTERVENTION:  Patient is at high risk for refeeding due to her prolonged poor PO and severe malnutrition.  Recommend 158m of thiamine x5 days after nutrition is initiated and monitoring of electrolytes q12h x3 days, replacing as needed.  Once PEG is placed recommend the following TF regimen: 359mof Osmolite 1.2 (1.5 cartons) 4x/d bolused via g-tube Flush with 6030mf free water before and after each feed (120m63mtal) This provides 1710kcal, 79g of protein, and 1650mL19mfree water (TF+flush) When starting feeds, advance slowly: on day 1: start with 120mL 35m carton) at each feed. If tolerated, on day 2 can increase to 237mL (43mrton) for the first two feeds and then to full amount (355mL/1.38mrtons) for last 2 daily feeds.   NUTRITION DIAGNOSIS:  Severe Malnutrition (in the context of chronic illness) related to  (swallowing difficulty) as evidenced by severe fat depletion, severe muscle depletion, percent weight loss.  GOAL:   Patient will meet greater than or equal to 90% of their needs  MONITOR:   TF tolerance, Weight trends, Labs, I & O's  REASON FOR ASSESSMENT:   Consult Enteral/tube feeding initiation and management (Once G tube is placed she will need to start tube feedings >> bolus tube feedings. Daughter (Amber) Advertising account plannerlike to be present for discussions/ teachings)  ASSESSMENT:   Pt with hx of GERD s/p Nissen in 2016 and HTN sent to ED from GI appointment after imaging showed an esophageal perforation. Pt reports trouble swallowing since her surgery in February where she underwent hernia repair and g-tube placement.  Reviewed hx over the past couple months and it appears that pt also underwent an esophageal stretching in June.   Met with pt and family in her room at the time of assessment. G tube that was placed in February was not in place for very long.  Discussed experience with last tube and it was not positive. Pt and family did not receive teaching on tube care and maintenance prior to leaving the hospital in February and the purpose of having the tube was also not clear to the family. Experienced several complications prior to removal one diet was advanced. Pt used tube for venting and also for nutrition when she was not able to take in adequate amounts. Pt was instructed to use ensure, has never been on a tube feeding formula.     Very poor nutrition over the last 6 months due to difficulty swallowing and vomiting. Per family, 30lb wt loss since February with 10lb of that being over the last 2 weeks. Significant muscle and fat loss are seen on exam.   IR consult pending to determine when PEG can be placed. Pt reports that she has been instructed she will be NPO for the foreseeable future until esophageal perforation can be repaired. Pt will need to use her PEG for 100% of her nutrition needs as well as for medication administration.  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, acute gastritis   Nutritionally Relevant Medications: Scheduled Meds:  pantoprazole (PROTONIX) IV  40 mg Intravenous QHS   Continuous Infusions:  dextrose 5 % and 0.45 % NaCl with KCl 20 mEq/L 75 mL/hr at 03/26/22 0929   piperacillin-tazobactam (ZOSYN)  IV 3.375 g (03/26/22 0943)   Labs Reviewed  NUTRITION - FOCUSED PHYSICAL EXAM:  Flowsheet Row Most Recent Value  Orbital Region Severe depletion  Upper Arm Region Moderate depletion  Thoracic and Lumbar Region Severe depletion  Buccal Region Severe depletion  Temple Region Moderate depletion  Clavicle Bone Region Moderate depletion  Clavicle and Acromion Bone Region Severe depletion  Scapular Bone Region Moderate depletion  Dorsal Hand Severe depletion  Patellar Region Severe depletion  Anterior Thigh Region Severe depletion  Posterior Calf Region Moderate depletion  Edema  (RD Assessment) None  Hair Reviewed  Eyes Reviewed  Mouth Reviewed  Skin Reviewed  Nails Reviewed   Diet Order:   Diet Order             Diet NPO time specified  Diet effective now                   EDUCATION NEEDS:   Education needs have been addressed  Skin:  Skin Assessment: Reviewed RN Assessment  Last BM:  8/18  Height:  Ht Readings from Last 1 Encounters:  03/25/22 _0  (1.6 m)    Weight:  Wt Readings from Last 1 Encounters:  03/25/22 54.4 kg    Ideal Body Weight:  52.3 kg  BMI:  Body mass index is 21.24 kg/m.  Estimated Nutritional Needs:  Kcal:  1600-1800 kcal/d Protein:  70-85g/d Fluid:  >/=1.6L/d    Ranell Patrick, RD, LDN Clinical Dietitian RD pager # available in [facility]  After hours/weekend pager # available in [facility]

## 2022-03-26 NOTE — Consult Note (Signed)
Chief Complaint: Patient was seen in consultation today for  Chief Complaint  Patient presents with   Dysphagia   at the request of General Surgery   Referring Physician(s): Margie Billet, PA-C  Supervising Physician: Corrie Mckusick  Patient Status: [facility] - In-pt  History of Present Illness: Julie Day is a 76 y.o. female who is s/p Nissen fundoplication in [id number] with laparoscopic revision of the hiatal hernia repair, laparoscopic paraesophageal hernia repair and placement of a G-tube in Feb 2023. Reports difficulty eating solid foods since surgery in Feb 2023. Daughter and husband help with the history and report recurrent vomiting when lying down to sleep and G-tube was removed several weeks postop. She continues to have daily issues eating where she would have intense belching and regurgitation with eating a few bites of food. Liquids would pass down ok but she would still have recurrent belching. In June she had an EGD with empiric dilation of the distal esophagus and GEJ with a TTS balloon to 20 mm. She feels like the dilation helped somewhat for 1 day. Her difficulty eating has worsened over the last month where she has only been able to tolerate liquids. Has lost 30 pounds since Feb and about 10 pounds in the last 2 weeks. UGI on 8/18 showed a contained collection of fluid concerning for a fistula vs contained distal esophageal leak. CT yesterday showed contained esophageal leak. IR has been consulted for placement of percutaneous gastrostomy tube with the goal of rectifying malnutrition status in the setting of esophageal compromise and dysphagia.  Past Medical History:  Diagnosis Date   Allergic rhinitis    Anginal pain ([place])    Dx as GERD   Atypical mole 09/23/2017   Left Inner Thigh-Mild   Atypical mole 09/23/2017   Right Axilla-Mild   Atypical mole 09/05/2015   Center Abdomen-Severe (clear) (Dr. [place] Crane)   Atypical mole 04/14/2019   Right Thigh-Solar Lentigo   Chronic  cystitis    Degenerative disc disease    GERD (gastroesophageal reflux disease)    Hemangioma of liver    History of hiatal hernia 2017   Hypertension    SCC (squamous cell carcinoma) 09/05/2015   Left Dorsal Hand-Well Diff Keratoacanthoma (Dr. Nevada Crane) (free)   SCC (squamous cell carcinoma) 04/14/2019   Left Outer Shin-Keratoacanthoma (treatment after biopsy)   SCC (squamous cell carcinoma) 07/21/2019   Right Thigh-Keratoacanthoma (treatment Fluorouracil Cream)   Squamous cell carcinoma of skin 09/23/2017   Right Upper Shin-Well Diff (Cx3,5FU)    Past Surgical History:  Procedure Laterality Date   ABDOMINAL HYSTERECTOMY     CHOLECYSTECTOMY     COLON RESECTION N/A 05/31/2015   Procedure: LAPAROSCOPIC REPAIR OF HIATAL HERNIA  AND LAPAROSCOPIC NISSEN FUNDOPLICATION;  Surgeon: Fanny Skates, MD;  Location: [facility];  Service: General;  Laterality: N/A;   ESOPHAGEAL MANOMETRY N/A 02/27/2015   Procedure: ESOPHAGEAL MANOMETRY (EM);  Surgeon: Garlan Fair, MD;  Location: [facility];  Service: Endoscopy;  Laterality: N/A;   ESOPHAGOGASTRODUODENOSCOPY (EGD) WITH PROPOFOL N/A 02/26/2015   Procedure: ESOPHAGOGASTRODUODENOSCOPY (EGD) WITH PROPOFOL;  Surgeon: Garlan Fair, MD;  Location: [facility];  Service: Endoscopy;  Laterality: N/A;   HIATAL HERNIA REPAIR  05/31/2015   HIATAL HERNIA REPAIR N/A 09/08/2021   Procedure: LAPAROSCOPIC REVISION HIATAL HERNIA REPAIR WITH FUNDOPLICATION AND MESH;  Surgeon: Kinsinger, Arta Bruce, MD;  Location: [facility];  Service: General;  Laterality: N/A;   LAPAROSCOPIC INSERTION GASTROSTOMY TUBE N/A 09/08/2021   Procedure: LAPAROSCOPIC INSERTION GASTROSTOMY TUBE;  Surgeon:  Kinsinger, Arta Bruce, MD;  Location: [facility];  Service: General;  Laterality: N/A;  18FR   TOTAL KNEE ARTHROPLASTY Right 10/03/2019   Procedure: RIGHT TOTAL KNEE ARTHROPLASTY;  Surgeon: Melrose Nakayama, MD;  Location: [facility];  Service: Orthopedics;  Laterality: Right;   WISDOM TOOTH EXTRACTION       Allergies: Trexall [methotrexate] and Prevnar 13 [pneumococcal 13-val conj vacc]  Medications: Prior to Admission medications   Medication Sig Start Date End Date Taking? Authorizing Provider  acetaminophen (TYLENOL) 650 MG CR tablet Take 650 mg by mouth daily as needed for pain.   Yes [provider]  Calcium Carb-Cholecalciferol (CALCIUM + D3 PO) Take 1 tablet by mouth daily.   Yes [provider]  cetirizine (ZYRTEC) 10 MG tablet Take 10 mg by mouth daily. 01/07/22  Yes [provider]  Cyanocobalamin (VITAMIN B-12 PO) Take 1 tablet by mouth daily.   Yes [provider]  MELATONIN PO Take 1 tablet by mouth at bedtime.   Yes [provider]  meloxicam (MOBIC) 15 MG tablet Take 15 mg by mouth daily as needed for pain (bursitis).   Yes [provider]  nitroGLYCERIN (NITROSTAT) 0.4 MG SL tablet Place 0.4 mg under the tongue every 5 (five) minutes as needed for chest pain.   Yes [provider]  pantoprazole (PROTONIX) 40 MG tablet Take 40 mg by mouth daily.   Yes [provider]  sucralfate (CARAFATE) 1 GM/10ML suspension Take 1 g by mouth 2 (two) times daily. 03/13/22  Yes [provider]  triamterene-hydrochlorothiazide (MAXZIDE-25) 37.5-25 MG per tablet Take 1 tablet by mouth daily.    Yes [provider]  mupirocin ointment (BACTROBAN) 2 % Apply 1 application. topically daily. Patient not taking: Reported on 03/24/2022 10/15/21   Warren Danes, PA-C     Family History  Problem Relation Age of Onset   Hypertension Mother    Hypertension Sister     Social History   Socioeconomic History   Marital status: Married    Spouse name: Not on file   Number of children: Not on file   Years of education: Not on file   Highest education level: Not on file  Occupational History   Not on file  Tobacco Use   Smoking status: Some Days    Types: Cigarettes    Last attempt to quit: 12/20/2008     Years since quitting: 13.2   Smokeless tobacco: Never  Vaping Use   Vaping Use: Never used  Substance and Sexual Activity   Alcohol use: No   Drug use: No   Sexual activity: Not on file  Other Topics Concern   Not on file  Social History Narrative   Not on file   Social Determinants of Health   Financial Resource Strain: Not on file  Food Insecurity: Not on file  Transportation Needs: Not on file  Physical Activity: Not on file  Stress: Not on file  Social Connections: Not on file    Review of Systems: A 12 point ROS discussed and pertinent positives are indicated in the HPI above.  All other systems are negative.  Review of Systems  Constitutional:  Positive for activity change, appetite change, fatigue and unexpected weight change.  HENT: Negative.    Eyes: Negative.   Respiratory: Negative.    Cardiovascular:  Positive for chest pain.  Gastrointestinal:  Positive for abdominal pain.  Endocrine: Negative.   Genitourinary: Negative.   Musculoskeletal: Negative.   Allergic/Immunologic: Negative.  Neurological: Negative.   Hematological: Negative.   Psychiatric/Behavioral: Negative.      Vital Signs: BP 135/75   Pulse 64   Temp 99.1 F (37.3 C) (Oral)   Resp 18   Ht '5\' 3"'$  (1.6 m)   Wt 119 lb 14.9 oz (54.4 kg)   SpO2 100%   BMI 21.24 kg/m   Physical Exam Vitals reviewed.  Constitutional:      General: She is not in acute distress. HENT:     Head: Normocephalic and atraumatic.     Mouth/Throat:     Mouth: Mucous membranes are moist.     Pharynx: Oropharynx is clear.  Eyes:     Extraocular Movements: Extraocular movements intact.     Conjunctiva/sclera: Conjunctivae normal.  Cardiovascular:     Rate and Rhythm: Normal rate and regular rhythm.     Pulses: Normal pulses.  Pulmonary:     Effort: Pulmonary effort is normal. No respiratory distress.  Abdominal:     General: Abdomen is flat.     Palpations: Abdomen is soft.  Skin:    General: Skin is  warm and dry.  Neurological:     General: No focal deficit present.     Mental Status: She is alert and oriented to person, place, and time.  Psychiatric:        Mood and Affect: Mood normal.        Behavior: Behavior normal.     Imaging: CT ABDOMEN W CONTRAST  Result Date: 03/23/2022 CLINICAL DATA:  Follow-up upper GI EXAM: CT CHEST AND ABDOMEN WITH CONTRAST TECHNIQUE: Multidetector CT imaging of the chest and abdomen was performed following the standard protocol during bolus administration of intravenous contrast. RADIATION DOSE REDUCTION: This exam was performed according to the departmental dose-optimization program which includes automated exposure control, adjustment of the mA and/or kV according to patient size and/or use of iterative reconstruction technique. CONTRAST:  19m OMNIPAQUE IOHEXOL 300 MG/ML  SOLN COMPARISON:  Upper GI dated March 20, 2022 FINDINGS: CT CHEST FINDINGS Cardiovascular: Normal heart size. No pericardial effusion. Normal heart size. No pericardial effusion. Dilated ascending thoracic aorta measuring 4.5 x 4.4 cm. Mild atherosclerotic disease of the thoracic aorta. Mediastinum/Nodes: Patulous and fluid-filled esophagus. Thyroid is unremarkable. No pathologically enlarged lymph nodes seen in the chest. Lungs/Pleura: Central airways are patent. Moderate centrilobular emphysema. Mild right-greater-than-left ground-glass opacities of the upper lungs. Small scattered solid pulmonary nodules. Reference nodule of the left upper lobe measuring 3 mm on series 6, image 43. Musculoskeletal: No chest wall mass or suspicious bone lesions identified. CT ABDOMEN FINDINGS Hepatobiliary: Scattered low-attenuation liver lesions, largest are compatible with simple cysts, others are too small to completely characterize. Status post cholecystectomy. No biliary dilatation. Pancreas: Unremarkable. No pancreatic ductal dilatation or surrounding inflammatory changes. Spleen: Normal in size without  focal abnormality. Adrenals/Urinary Tract: Bilateral adrenal glands are unremarkable. No hydronephrosis or nephrolithiasis. Small bilateral low-attenuation renal lesions which are too small to completely characterize but likely simple cysts, no further follow-up imaging is recommended. Stomach/Bowel: Postsurgical changes of prior Nissen fundoplication. Collection containing air and contrast, measuring approximately 2.1 x 1.6 x 2.7 cm, likely arising from the distal esophagus and along the right aspect of the distal esophagus/wrap with marked adjacent inflammatory changes. Findings are favored to be due to contained leak, communication with the wrap can not be definitely excluded. Visualized small and large bowel are normal in appearance. Vascular/Lymphatic: Aortic atherosclerosis. No enlarged abdominal or pelvic lymph nodes. Other: No free fluid or free  air seen in the abdomen or pelvis. Musculoskeletal: No acute or significant osseous findings. IMPRESSION: 1. Postsurgical changes of prior Nissen fundoplication with a collection containing air and contrast likely arising from the distal esophagus and located along the right aspect of the distal esophagus/wrap. Marked adjacent inflammatory changes. Findings are compatible with a contained leak, communication with the wrap can not be definitely excluded. 2. Dilated ascending thoracic aorta, measuring up to 4.5 cm. Ascending thoracic aortic aneurysm. Recommend semi-annual imaging followup by CTA or MRA and referral to cardiothoracic surgery if not already obtained. This recommendation follows 2010. ACCF/AHA/AATS/ACR/ASA/SCA/SCAI/SIR/STS/SVM Guidelines for the Diagnosis and Management of Patients With Thoracic Aortic Disease. Circulation. 2010; 121: C144-Y185. Aortic aneurysm NOS (ICD10-I71.9) 3. Mild ground-glass opacities of the bilateral upper lobes, likely infectious or inflammatory 4. Small scattered solid pulmonary nodules, largest measures 3 mm. Non-contrast  chest CT can be considered in 12 months if patient is high-risk. This recommendation follows the consensus statement: Guidelines for Management of Incidental Pulmonary Nodules Detected on CT Images: From the Fleischner Society 2017; Radiology 2017; 284:228-243. 5. Aortic Atherosclerosis (ICD10-I70.0) and Emphysema (ICD10-J43.9). Critical Value/emergent results were called by telephone at the time of interpretation on 03/23/2022 at 4:19 pm to provider Deliah Goody (PA), who verbally acknowledged these results. Electronically Signed   By: Yetta Glassman M.D.   On: 03/23/2022 16:22   CT CHEST W CONTRAST  Result Date: 03/23/2022 CLINICAL DATA:  Follow-up upper GI EXAM: CT CHEST AND ABDOMEN WITH CONTRAST TECHNIQUE: Multidetector CT imaging of the chest and abdomen was performed following the standard protocol during bolus administration of intravenous contrast. RADIATION DOSE REDUCTION: This exam was performed according to the departmental dose-optimization program which includes automated exposure control, adjustment of the mA and/or kV according to patient size and/or use of iterative reconstruction technique. CONTRAST:  60m OMNIPAQUE IOHEXOL 300 MG/ML  SOLN COMPARISON:  Upper GI dated March 20, 2022 FINDINGS: CT CHEST FINDINGS Cardiovascular: Normal heart size. No pericardial effusion. Normal heart size. No pericardial effusion. Dilated ascending thoracic aorta measuring 4.5 x 4.4 cm. Mild atherosclerotic disease of the thoracic aorta. Mediastinum/Nodes: Patulous and fluid-filled esophagus. Thyroid is unremarkable. No pathologically enlarged lymph nodes seen in the chest. Lungs/Pleura: Central airways are patent. Moderate centrilobular emphysema. Mild right-greater-than-left ground-glass opacities of the upper lungs. Small scattered solid pulmonary nodules. Reference nodule of the left upper lobe measuring 3 mm on series 6, image 43. Musculoskeletal: No chest wall mass or suspicious bone lesions identified. CT  ABDOMEN FINDINGS Hepatobiliary: Scattered low-attenuation liver lesions, largest are compatible with simple cysts, others are too small to completely characterize. Status post cholecystectomy. No biliary dilatation. Pancreas: Unremarkable. No pancreatic ductal dilatation or surrounding inflammatory changes. Spleen: Normal in size without focal abnormality. Adrenals/Urinary Tract: Bilateral adrenal glands are unremarkable. No hydronephrosis or nephrolithiasis. Small bilateral low-attenuation renal lesions which are too small to completely characterize but likely simple cysts, no further follow-up imaging is recommended. Stomach/Bowel: Postsurgical changes of prior Nissen fundoplication. Collection containing air and contrast, measuring approximately 2.1 x 1.6 x 2.7 cm, likely arising from the distal esophagus and along the right aspect of the distal esophagus/wrap with marked adjacent inflammatory changes. Findings are favored to be due to contained leak, communication with the wrap can not be definitely excluded. Visualized small and large bowel are normal in appearance. Vascular/Lymphatic: Aortic atherosclerosis. No enlarged abdominal or pelvic lymph nodes. Other: No free fluid or free air seen in the abdomen or pelvis. Musculoskeletal: No acute or significant osseous findings. IMPRESSION: 1. Postsurgical  changes of prior Nissen fundoplication with a collection containing air and contrast likely arising from the distal esophagus and located along the right aspect of the distal esophagus/wrap. Marked adjacent inflammatory changes. Findings are compatible with a contained leak, communication with the wrap can not be definitely excluded. 2. Dilated ascending thoracic aorta, measuring up to 4.5 cm. Ascending thoracic aortic aneurysm. Recommend semi-annual imaging followup by CTA or MRA and referral to cardiothoracic surgery if not already obtained. This recommendation follows 2010.  ACCF/AHA/AATS/ACR/ASA/SCA/SCAI/SIR/STS/SVM Guidelines for the Diagnosis and Management of Patients With Thoracic Aortic Disease. Circulation. 2010; 121: B867-J449. Aortic aneurysm NOS (ICD10-I71.9) 3. Mild ground-glass opacities of the bilateral upper lobes, likely infectious or inflammatory 4. Small scattered solid pulmonary nodules, largest measures 3 mm. Non-contrast chest CT can be considered in 12 months if patient is high-risk. This recommendation follows the consensus statement: Guidelines for Management of Incidental Pulmonary Nodules Detected on CT Images: From the Fleischner Society 2017; Radiology 2017; 284:228-243. 5. Aortic Atherosclerosis (ICD10-I70.0) and Emphysema (ICD10-J43.9). Critical Value/emergent results were called by telephone at the time of interpretation on 03/23/2022 at 4:19 pm to provider Deliah Goody (PA), who verbally acknowledged these results. Electronically Signed   By: Yetta Glassman M.D.   On: 03/23/2022 16:22   DG UGI W SINGLE CM (SOL OR THIN BA)  Result Date: 03/20/2022 CLINICAL DATA:  A 76 year old female presents for evaluation of difficulty eating following Nissen fundoplication. EXAM: UPPER GI SERIES WITH KUB TECHNIQUE: After obtaining a scout radiograph a routine upper GI series was performed using thin barium and water-soluble contrast. FLUOROSCOPY: Radiation Exposure Index (as provided by the fluoroscopic device): 56.6 mGy Kerma COMPARISON:  February of 2023 FINDINGS: Scout image 1st obtained demonstrates surgical changes near the expected location of the GE junction related to Nissen fundoplication. Surgical clips related to prior cholecystectomy are noted as well. Bowel gas pattern nonobstructive. No acute skeletal findings on limited evaluation. Swallows of thin barium first performed in the AP and in the lateral projections without gross swallow dysfunction. After initial swallows of thin barium a small tract was identified extending from the area just above the  fundoplication wrap (image 3/8) the esophagus was markedly patulous and there was discoordinated peristaltic activity above this site of fundoplication. Because of the potential for extraluminal abnormal contrast collection on initial swallows the study was switched to water-soluble contrast. Subsequent images obtained in AP and bilateral oblique positions while standing continue to show poor emptying from the esophagus into the stomach and 8 channel extending towards a a "collection of contrast" which appears anterior and lateral to the GE junction above the fundoplication likely arising from the distal esophagus. Question of gastric mucosa in this area. This is adjacent to the diaphragm likely below the diaphragm and at greatest distension measures Contrast once it reached the stomach passed readily into the small bowel with normal duodenal jejunal anatomy. Limited assessment otherwise of these areas due to limited contrast that would pass through the level of the Nissen fundoplication. IMPRESSION: 1. Sinus tract into "contained collection" anterior and to the RIGHT of the distal esophagus near the RIGHT hemidiaphragm potentially a fistula from the esophagus into the Nissen gastric wrap based on appearance. Contained distal esophageal leak could also have a similar appearance, and given above findings double contrast study was not performed. Would suggest after clearance of barium contrast, a CT following ingestion of water-soluble contrast for further evaluation to better map the above findings. 2. Narrowing of the fundoplication site such that there is  limited passage of contrast media through the fundoplication and into the stomach. 3. Patulous esophagus with signs of dysmotility. Would also suggest correlation with would also suggest correlation with manometry to ensure no secondary or even chronic primary esophageal dysmotility disorder. 4. Prompt emptying of contrast that did pass into the stomach into the  small bowel as discussed. These results were called by telephone at the time of interpretation on 03/20/2022 at 9:29 am to provider [facility] , who verbally acknowledged these results. Electronically Signed   By: Zetta Bills M.D.   On: 03/20/2022 09:29    Labs:  CBC: Recent Labs    09/09/21 0351 09/10/21 0333 03/24/22 0859 03/25/22 0352  WBC 10.7* 10.0 7.4 7.3  HGB 12.1 12.7 13.0 11.5*  HCT 36.5 37.8 38.8 34.0*  PLT 242 244 373 299    COAGS: No results for input(s): "INR", "APTT" in the last 8760 hours.  BMP: Recent Labs    09/09/21 0351 09/10/21 0333 03/24/22 0859 03/25/22 0352  NA 139 139 140 140  K 3.3* 3.5 3.6 3.6  CL 101 103 103 107  CO2 '31 28 26 23  '$ GLUCOSE 113* 99 102* 100*  BUN 16 10 6* 8  CALCIUM 9.3 9.2 9.9 9.5  CREATININE 0.88 0.64 0.87 0.93  GFRNONAA >60 >60 >60 >60    LIVER FUNCTION TESTS: Recent Labs    03/24/22 0859 03/25/22 0352  BILITOT 0.5 0.9  AST 20 14*  ALT 14 10  ALKPHOS 84 68  PROT 6.8 5.7*  ALBUMIN 3.6 3.1*     Assessment and Plan:  Dysphagia --current esophageal perforation --malnutrition --Plan for percutaneous G-tube placement, with Dr. Earleen Newport, tentatively for Friday 8/25. --Pt is NPO, lovenox to be held, antibiotic ordered, consent in IR  Risks and benefits image guided gastrostomy tube placement was discussed with the patient including, but not limited to the need for a barium enema during the procedure, bleeding, infection, peritonitis and/or damage to adjacent structures including further damage to perforated esophagus.  All of the patient's questions were answered, patient is agreeable to proceed.  Consent signed and in chart.   Thank you for this interesting consult.  I greatly enjoyed meeting Julie Day and look forward to participating in their care.  A copy of this report was sent to the requesting provider on this date.  Electronically Signed: Pasty Spillers, PA 03/26/2022, 2:12 PM   I spent a  total of 40 Minutes in face to face in clinical consultation, greater than 50% of which was counseling/coordinating care for g-tube placement

## 2022-03-26 NOTE — TOC Initial Note (Addendum)
Transition of Care [facility]) - Initial/Assessment Note    Patient Details  Name: Julie Day MRN: [id number] Date of Birth: 06/16/1946  Transition of Care [facility]) CM/SW Contact:    Curlene Labrum, RN Phone Number: 03/26/2022, 2:43 PM  Clinical Narrative:                 CM met with the patient at the bedside to discuss transitions of care to home - later date after PEG placement.  The patient lives at home with her spouse and has family support.  The patient has had PEG tube in the past and had poor experience with lack of teaching prior to discharge to home.  I spoke with Nutritionist and she asked for [facility] RN for supportive teaching and DME to be ordered.  PEG to be placed tomorrow.  I met with the patient and gave Medicare choice regarding home health and she has had [place] through [facility] in the past.  I called [facility] and they will supply RN for home health teaching.  I called Carolynn Sayers, RNCM with [facility] to supply PEG tube supplies, teaching and formula for the home.  Family will be providing transportation to home.  CM will continue to follow the patient for discharge needs for home in the next 1-2 days per MD.  [facility] RN unable to provide RN - [facility] will provide [facility] RN.  Expected Discharge Plan: [place] Barriers to Discharge: Continued Medical Work up   Patient Goals and CMS Choice Patient states their goals for this hospitalization and ongoing recovery are:: To return home with [facility] RN for teaching support for PEG CMS Medicare.gov Compare Post Acute Care list provided to:: Patient Choice offered to / list presented to : Patient  Expected Discharge Plan and Services Expected Discharge Plan: [place]   Discharge Planning Services: CM Consult Post Acute Care Choice: Durable Medical Equipment, Home Health Living arrangements for the past 2 months: Single Family Home                 DME Arranged: Tube feeding DME Agency:  Carolynn Sayers,  RNCM with [facility] for tube feeding supplies) Date DME Agency Contacted: 03/26/22 Time DME Agency Contacted: [phone] Representative spoke with at DME Agency: Carolynn Sayers, [place] with [place] [facility], RNCM with [facility] for tube feeding supplies) HH Arranged: RN [place] Agency: [place] ([place]) Date HH Agency Contacted: 03/26/22 Time [place]: [phone] Representative spoke with at [place]: Lillia Mountain, [place] with [facility]  Prior Living Arrangements/Services Living arrangements for the past 2 months: [place] with:: Spouse Patient language and need for interpreter reviewed:: Yes Do you feel safe going back to the place where you live?: Yes      Need for Family Participation in Patient Care: Yes (Comment) Care giver support system in place?: Yes (comment)   Criminal Activity/Legal Involvement Pertinent to Current Situation/Hospitalization: No - Comment as needed  Activities of Daily Living Home Assistive Devices/Equipment: None ADL Screening (condition at time of admission) Patient's cognitive ability adequate to safely complete daily activities?: Yes Is the patient deaf or have difficulty hearing?: No Does the patient have difficulty seeing, even when wearing glasses/contacts?: No Does the patient have difficulty concentrating, remembering, or making decisions?: No Patient able to express need for assistance with ADLs?: Yes Does the patient have difficulty dressing or bathing?: No Independently performs ADLs?: Yes (appropriate for developmental age) Does the patient have difficulty walking or  climbing stairs?: No Weakness of Legs: None Weakness of Arms/Hands: None  Permission Sought/Granted Permission sought to share information with : Family Supports, Tourist information centre manager, PCP Permission granted to share information with : Yes, Verbal Permission Granted     Permission granted to share info w AGENCY: [facility] for tube feeding, [facility] for RN [facility]  Permission granted to  share info w Relationship: spouse     Emotional Assessment Appearance:: Appears stated age Attitude/Demeanor/Rapport: Gracious Affect (typically observed): Accepting Orientation: : Oriented to Self, Oriented to Place, Oriented to  Time, Oriented to Situation Alcohol / Substance Use: Not Applicable Psych Involvement: No (comment)  Admission diagnosis:  Esophageal perforation [K22.3] Patient Active Problem List   Diagnosis Date Noted   Protein-calorie malnutrition, severe 03/26/2022   Esophageal perforation 03/24/2022   Hiatal hernia 09/08/2021   Primary localized osteoarthritis of right knee 10/03/2019   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 [place] [address] [place] [address] Phone: [phone] Fax: [phone]  [place], [place] [address] [place] [address] Phone: [phone] Fax: [phone]     Social Determinants of Health (SDOH) Interventions    Readmission Risk Interventions    03/26/2022    2:43 PM  Readmission Risk Prevention Plan  Post Dischage Appt Complete  Medication Screening Complete  Transportation Screening Complete

## 2022-03-26 NOTE — Anesthesia Postprocedure Evaluation (Signed)
Anesthesia Post Note  Patient: Julie Day  Procedure(s) Performed: ESOPHAGOGASTRODUODENOSCOPY (EGD)     Anesthesia Post Evaluation No notable events documented.  Last Vitals:  Vitals:   03/25/22 2041 03/26/22 1400  BP: (!) 146/71 135/75  Pulse: 61 64  Resp: 18   Temp: 37.3 C   SpO2: 100%     Last Pain:  Vitals:   03/26/22 0735  TempSrc:   PainSc: [place]

## 2022-03-26 NOTE — Progress Notes (Signed)
[facility] Gastroenterology Progress Note  Julie Day 76 y.o. [birth year]   Subjective: Feels ok. Slept well. Denies abdominal pain.  Objective: Vital signs: Vitals:   03/25/22 1510 03/25/22 2041  BP: 120/63 (!) 146/71  Pulse: 67 61  Resp: 15 18  Temp:  99.1 F (37.3 C)  SpO2: 95% 100%    Physical Exam: Gen: alert, no acute distress, elderly HEENT: anicteric sclera CV: RRR Chest: CTA B Abd: soft, nontender, nondistended, +BS Ext: no edema  Lab Results: Recent Labs    03/24/22 0859 03/25/22 0352  NA 140 140  K 3.6 3.6  CL 103 107  CO2 26 23  GLUCOSE 102* 100*  BUN 6* 8  CREATININE 0.87 0.93  CALCIUM 9.9 9.5   Recent Labs    03/24/22 0859 03/25/22 0352  AST 20 14*  ALT 14 10  ALKPHOS 84 68  BILITOT 0.5 0.9  PROT 6.8 5.7*  ALBUMIN 3.6 3.1*   Recent Labs    03/24/22 0859 03/25/22 0352  WBC 7.4 7.3  NEUTROABS 4.7  --   HGB 13.0 11.5*  HCT 38.8 34.0*  MCV 88.0 87.9  PLT 373 299      Assessment/Plan: Distal contained esophageal leak - s/p endoscopy visualizing small opening. Esophageal stent will likely migrate so will hold off on that. Unclear why she is having trouble eating with this small opening. Agree with G-tube placement and home after ok with use of G-tube. Plan is for outpt placement of large endoscopic clips across the leak to see if that will seal up the leak and that procedure will be done by Dr. Rush Landmark as an outpt (timing per his office but plan for next 1-2 weeks). Will need repeat swallow study in near future to see if leak seals and if still is not able to eat then defer to surgery whether additional surgical management of fundoplication is needed.    [name] 03/26/2022, 10:20 AM  Questions please call [phone] Patient ID: Julie Day, female   DOB: 1945/08/19, 76 y.o.   MRN: [id number]

## 2022-03-27 ENCOUNTER — Inpatient Hospital Stay (HOSPITAL_COMMUNITY): Payer: Medicare HMO

## 2022-03-27 ENCOUNTER — Other Ambulatory Visit (HOSPITAL_COMMUNITY): Payer: Self-pay

## 2022-03-27 ENCOUNTER — Telehealth: Payer: Self-pay

## 2022-03-27 DIAGNOSIS — Z431 Encounter for attention to gastrostomy: Secondary | ICD-10-CM | POA: Diagnosis not present

## 2022-03-27 DIAGNOSIS — E43 Unspecified severe protein-calorie malnutrition: Secondary | ICD-10-CM | POA: Diagnosis not present

## 2022-03-27 DIAGNOSIS — K223 Perforation of esophagus: Secondary | ICD-10-CM | POA: Diagnosis not present

## 2022-03-27 DIAGNOSIS — R1310 Dysphagia, unspecified: Secondary | ICD-10-CM | POA: Diagnosis not present

## 2022-03-27 DIAGNOSIS — R634 Abnormal weight loss: Secondary | ICD-10-CM | POA: Diagnosis not present

## 2022-03-27 HISTORY — PX: IR GASTROSTOMY TUBE MOD SED: IMG625

## 2022-03-27 MED ORDER — BACITRACIN-NEOMYCIN-POLYMYXIN OINTMENT TUBE: 1.0000 | TOPICAL_OINTMENT | Freq: Every day | CUTANEOUS | 0 refills | Status: AC

## 2022-03-27 MED ORDER — BACITRACIN-NEOMYCIN-POLYMYXIN OINTMENT TUBE
1.0000 | TOPICAL_OINTMENT | Freq: Every day | CUTANEOUS | Status: DC
  Filled 2022-03-27: qty 14

## 2022-03-27 MED ORDER — MIDAZOLAM HCL 2 MG/2ML IJ SOLN
INTRAMUSCULAR | Status: DC | PRN
  Administered 2022-03-27: .5 mg via INTRAVENOUS
  Administered 2022-03-27 (×2): 1 mg via INTRAVENOUS

## 2022-03-27 MED ORDER — FENTANYL CITRATE (PF) 100 MCG/2ML IJ SOLN
INTRAMUSCULAR | Status: AC
  Filled 2022-03-27: qty 2

## 2022-03-27 MED ORDER — OXYCODONE HCL 5 MG/5ML PO SOLN
5.0000 mg | Freq: Four times a day (QID) | ORAL | 0 refills | Status: DC | PRN
  Filled 2022-03-27: qty 100, 5d supply, fill #0

## 2022-03-27 MED ORDER — ACETAMINOPHEN 160 MG/5ML PO SUSP: 650.0000 mg | Freq: Four times a day (QID) | ORAL | 0 refills | Status: DC | PRN

## 2022-03-27 MED ORDER — AMOXICILLIN-POT CLAVULANATE 400-57 MG/5ML PO SUSR
10.0000 mL | Freq: Two times a day (BID) | ORAL | 0 refills | Status: AC
  Filled 2022-03-27: qty 100, 5d supply, fill #0

## 2022-03-27 MED ORDER — IOHEXOL 300 MG/ML  SOLN
100.0000 mL | Freq: Once | INTRAMUSCULAR | Status: AC | PRN
  Administered 2022-03-27: 15 mL

## 2022-03-27 MED ORDER — MIDAZOLAM HCL 2 MG/2ML IJ SOLN
INTRAMUSCULAR | Status: AC
  Filled 2022-03-27: qty 4

## 2022-03-27 MED ORDER — FENTANYL CITRATE (PF) 100 MCG/2ML IJ SOLN
INTRAMUSCULAR | Status: DC | PRN
  Administered 2022-03-27: 50 ug via INTRAVENOUS

## 2022-03-27 MED ORDER — CEFAZOLIN SODIUM-DEXTROSE 2-4 GM/100ML-% IV SOLN
INTRAVENOUS | Status: AC
  Administered 2022-03-27: 2 g via INTRAVENOUS
  Filled 2022-03-27: qty 100

## 2022-03-27 MED ORDER — LIDOCAINE HCL 1 % IJ SOLN
INTRAMUSCULAR | Status: AC
  Filled 2022-03-27: qty 20

## 2022-03-27 NOTE — Sedation Documentation (Signed)
Vital signs stable. 

## 2022-03-27 NOTE — Sedation Documentation (Signed)
Patient is resting comfortably. VSS °

## 2022-03-27 NOTE — Sedation Documentation (Signed)
Patient is resting comfortably. VSS, pt sleeping at this time. Procedure continues

## 2022-03-27 NOTE — Progress Notes (Signed)
PT AVS reviewed and pt verbalized understanding of all DC teaching and instructions. Pt has been educated on [place] care. Pt has all belongings in her possession. PT will be leaving with spouse as transportation.

## 2022-03-27 NOTE — Discharge Summary (Signed)
Patient ID: Julie Day [id number] 07-14-1946 76 y.o.  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 Michail Sermon  Reason for Admission: Julie Day is a 76 y.o. female who underwent laparoscopic paraesophageal hernia repair, laparoscopic Nissen fundoplication, insertion of mesh, insertion of gastrostomy tube on 09/08/21 by Dr. Kieth Brightly. States that she has had issues with tolerating PO since surgery. She has been followed by [facility] as an outpatient and reports her last EGD with esophageal dilatation was in June of this year; this did not improve her symptoms. She drinks one Ensure daily and takes 3-4 bites of soft meals, but still vomits most days. She also reports having a lot of burping. Denies abdominal pain, fever, or chills. Take pantoprazole and sucralfate without benefit. She has last 20-25lb since surgery.  She underwent UGI 8/18 which prompted ordering an outpatient CT c/a/p. This reported this morning with a collection containing air and contrast likely arising from the distal esophagus and located along the right aspect of the distal esophagus/wrap, marked adjacent inflammatory changes consistent with a contained leak, communication with the wrap can not be definitely excluded. She was advised to come to the ED by her gastroenterologist.  Procedures Upper endoscopy, Dr. Michail Sermon 8/23  Hospital Course:  The patient was admitted and started on IV zosyn for her presumed contained esophageal perforation.  This was strongly suspect on CT of the chest, but she underwent an EGD  which did confirm this visually.  She was kept NPO and IR was asked to place a gastrostomy tube for nutrition.  Home health was arranged to assist with feeding and g-tube teaching at home.  She did well after g-tube placement and was comfortable with g-tube care and instructions.  She was discharged on 5 more days or oral solution augmentin.  She will follow up with Dr. Redmond Pulling as well as Dr. Rush Landmark to assess for clipping of this area.    Physical Exam: Gen: NAD Abd: soft, appropriately tender after g-tube placement, g-tube site clean with no bleeding.  Allergies as of 03/27/2022       Reactions   Trexall [methotrexate] Other (See Comments)   Hair loss Fatigue    Prevnar 13 [pneumococcal 13-val Conj Vacc] Itching, Swelling, Rash   Redness around injection site Swelling around injection site Fever         Medication List     STOP taking these medications    acetaminophen 650 MG CR tablet Commonly known as: TYLENOL Replaced by: acetaminophen 160 MG/5ML suspension   mupirocin ointment 2 % Commonly known as: BACTROBAN   sucralfate 1 GM/10ML suspension Commonly known as: CARAFATE       TAKE these medications    acetaminophen 160 MG/5ML suspension Commonly known as: Tylenol Childrens Take 20.3-31.3 mLs (650-1,000 mg total) by mouth every 6 (six) hours as needed. Replaces: acetaminophen 650 MG CR tablet   amoxicillin-clavulanate 400-57 MG/5ML suspension Commonly known as: AUGMENTIN Place 10 mLs into feeding tube 2 (two) times daily for 5 days.   CALCIUM + D3 PO Take 1 tablet by mouth daily.   cetirizine 10 MG tablet Commonly known as: ZYRTEC Take 10 mg by  mouth daily.   MELATONIN PO Take 1 tablet by mouth at bedtime.   meloxicam 15 MG tablet Commonly known as: MOBIC Take 15 mg by mouth daily as needed for pain (bursitis).   neomycin-bacitracin-polymyxin Oint Commonly known as: NEOSPORIN Apply 1 Application topically daily for 7 days. Start taking on:  March 28, 2022   nitroGLYCERIN 0.4 MG SL tablet Commonly known as: NITROSTAT Place 0.4 mg under the tongue every 5 (five) minutes as needed for chest pain.   oxyCODONE 5 MG/5ML solution Commonly known as: ROXICODONE Place 5 mLs (5 mg total) into feeding tube every 6 (six) hours as needed for moderate pain.   pantoprazole 40 MG tablet Commonly known as: PROTONIX Take 40 mg by mouth daily.   triamterene-hydrochlorothiazide 37.5-25 MG tablet Commonly known as: MAXZIDE-25 Take 1 tablet by mouth daily.   VITAMIN B-12 PO Take 1 tablet by mouth daily.               Durable Medical Equipment  (From admission, onward)           Start     Ordered   03/27/22 1216  For home use only DME Tube feeding  Once       Comments:  Patient is at high risk for refeeding due to her prolonged poor PO and severe malnutrition.   Recommend '100mg'$  of thiamine x5 days after nutrition is initiated and monitoring of electrolytes q12h x3 days, replacing as needed.   Once PEG is placed recommend the following TF regimen:  338m of Osmolite 1.2 (1.5 cartons) 4x/d bolused via g-tube  Anticipated Long Term Need  Flush with 638mof free water before and after each feed (12062motal)  This provides 1710kcal, 79g of protein, and 1650m49m free water (TF+flush)  When starting feeds, advance slowly: on day 1: start with 120mL90m2 carton) at each feed. If tolerated, on day 2 can increase to 237mL 74marton) for the first two feeds and then to full amount (355mL/160martons) for last 2 daily feeds  Anticipated long term need   03/27/22 1216              Follow-up Information     [facility] Follow up.   Why: [facility] will be supplying you with tube feeding supplies for home.  Please call the agency if you need additional supplies.  [phone] up.   Why: [facility] will be providing HH RN [place] will call you in the next 1-2 days after discharge to support teaching  in the home. Contact information: [phone] [address] [phone]  [place],Greer Pickerelllow up on 04/15/2022.   Specialty: General Surgery Why: 8:45am, Arrive 15 minutes prior to your appointment time, Please bring your insurance card and photo ID Contact information: [address] [phone]         [place], GabrielTelford Nabollow up on 04/10/2022.   Specialties: Gastroenterology, Internal Medicine Contact information: [address] [phone]          Signed: Claretha Townshend OSaverio DankerCeGarden State [facility]oscopy [facility] Surgery Centery 03/27/2022, 2:55 PM Please see Amion for pager number during day hours 7:00am-4:30pm, 7-11:30am on Weekends

## 2022-03-27 NOTE — TOC Transition Note (Signed)
Transition of Care [facility]) - CM/SW Discharge Note   Patient Details  Name: Julie Day MRN: [id number] Date of Birth: March 03, 1946  Transition [facility]) CM/SW Contact:  Curlene Labrum, RN Phone Number: 03/27/2022, 3:17 PM   Clinical Narrative:    Cm met with the patient at the bedside prior to discharge to home.  The patient has received Enteral feeding supplies from [facility] that will be delivered to her home this evening.  Bedside teaching for PEG and feeding completed by Tamera Punt, [facility] with [facility].  Home health is set up and orders completed.  I asked that the daughter order a foam body wedge from [place] to provide tilt in the bed for digestion needs.  Discharge meds will be provided through [place].  The patient is set up and can be discharged home with family by car.   Final next level of care: [place] Barriers to Discharge: Continued Medical Work up   Patient Goals and CMS Choice Patient states their goals for this hospitalization and ongoing recovery are:: To return home with [facility] RN for teaching support for PEG CMS Medicare.gov Compare Post Acute Care list provided to:: Patient Choice offered to / list presented to : Patient  Discharge Placement                       Discharge Plan and Services   Discharge Planning Services: CM Consult Post Acute Care Choice: Durable Medical Equipment, Home Health          DME Arranged: Tube feeding DME Agency:  Carolynn Sayers, RNCM with [facility] for tube feeding supplies) Date DME Agency Contacted: 03/26/22 Time DME Agency Contacted: 9030 Representative spoke with at DME Agency: Carolynn Sayers, [place] with [place] [facility], RNCM with [facility] for tube feeding supplies) HH Arranged: RN [place] Agency: [place] ([place]) Date HH Agency Contacted: 03/26/22 Time [place]: 1442 Representative spoke with at [place]: Lillia Mountain, [place] with [facility]  Social Determinants of Health (SDOH)  Interventions     Readmission Risk Interventions    03/26/2022    2:43 PM  Readmission Risk Prevention Plan  Post Dischage Appt Complete  Medication Screening Complete  Transportation Screening Complete

## 2022-03-27 NOTE — Procedures (Signed)
Interventional Radiology Procedure Note  Procedure: Placement of percutaneous 71F pull-through gastrostomy tube. Complications: None Recommendations:  - Small volume meals and low volume usage of the new Gtube ok starting now - routine wound care  Signed,   Dulcy Fanny. Earleen Newport, DO

## 2022-03-27 NOTE — Discharge Instructions (Signed)
You may have a few sips of liquids and you make take your medications with a sip of water.  If the medication is a liquid, please put this down your g-tube

## 2022-03-27 NOTE — Progress Notes (Signed)
[facility] Gastroenterology Progress Note  Julie Day 76 y.o. 09/81/1914   Subjective: Feels good. Excited about going home. No complaints.  Objective: Vital signs: Vitals:   03/27/22 0955 03/27/22 1002  BP: (!) 133/107 (!) 149/78  Pulse: 69 (!) 58  Resp: 14 12  Temp:    SpO2: 100% 98%  T 97.7  Physical Exam: Gen: elderly, alert, no acute distress, pleasant HEENT: anicteric sclera CV: RRR Chest: CTA B Abd: G-tube placement with tenderness (appropriate), soft, nondistended, +BS Ext: no edema  Lab Results: Recent Labs    03/25/22 0352  NA 140  K 3.6  CL 107  CO2 23  GLUCOSE 100*  BUN 8  CREATININE 0.93  CALCIUM 9.5   Recent Labs    03/25/22 0352  AST 14*  ALT 10  ALKPHOS 68  BILITOT 0.9  PROT 5.7*  ALBUMIN 3.1*   Recent Labs    03/25/22 0352  WBC 7.3  HGB 11.5*  HCT 34.0*  MCV 87.9  PLT 299      Assessment/Plan: Contained esophageal leak - s/p G-tube placement due to need for enteral nutrition. Outpt f/u with Dr. Rush Landmark for special endoscopic clips placed to close leak. F/U with me in 2 months (pt requesting transfer to me). Going home today.    [name] 03/27/2022, 3:35 PM  Questions please call [phone] Patient ID: Julie Day, female   DOB: [birth year], 76 y.o.   MRN: [id number]

## 2022-03-27 NOTE — Sedation Documentation (Signed)
Patient is resting comfortably. 

## 2022-03-27 NOTE — Telephone Encounter (Signed)
Appt made to see Dr Rush Landmark on 04/10/22 at 70 am-the pt will be given the appt information when discharged.  I will also send a message to My Chart.

## 2022-03-27 NOTE — Telephone Encounter (Signed)
-----   Message from Irving Copas., MD sent at 03/26/2022  5:14 PM EDT ----- Regarding: Outpatient clinic visit consultation Julie Day, This patient is currently hospitalized but will be discharged likely over the weekend. She is an Animal nutritionist GI patient. She has an esophageal fistula and I need to talk with her about consideration of endoscopic approach at closure. I also need to work on ordering some clips and that I have to work through the hospital. Set her up for a clinic visit with me in the next 1 to 3 weeks. Once I know about the clipping availability/device, we can work on getting her scheduled as an EGD in the hospital-based setting. Please update Dr. Michail Sermon and I for when she is scheduled for her clinic visit. Okay to use a held slot in the morning or afternoon or a 3:50 PM slot for me to see her in clinic.  Thanks. GM

## 2022-03-28 DIAGNOSIS — R1310 Dysphagia, unspecified: Secondary | ICD-10-CM | POA: Diagnosis not present

## 2022-03-28 DIAGNOSIS — K223 Perforation of esophagus: Secondary | ICD-10-CM | POA: Diagnosis not present

## 2022-03-28 DIAGNOSIS — E43 Unspecified severe protein-calorie malnutrition: Secondary | ICD-10-CM | POA: Diagnosis not present

## 2022-03-29 DIAGNOSIS — K223 Perforation of esophagus: Secondary | ICD-10-CM | POA: Diagnosis not present

## 2022-03-29 DIAGNOSIS — R1310 Dysphagia, unspecified: Secondary | ICD-10-CM | POA: Diagnosis not present

## 2022-03-29 DIAGNOSIS — E43 Unspecified severe protein-calorie malnutrition: Secondary | ICD-10-CM | POA: Diagnosis not present

## 2022-03-29 LAB — CULTURE, BLOOD (ROUTINE X 2)
Culture: NO GROWTH
Culture: NO GROWTH

## 2022-03-30 DIAGNOSIS — R1310 Dysphagia, unspecified: Secondary | ICD-10-CM | POA: Diagnosis not present

## 2022-03-30 DIAGNOSIS — K223 Perforation of esophagus: Secondary | ICD-10-CM | POA: Diagnosis not present

## 2022-03-30 DIAGNOSIS — E43 Unspecified severe protein-calorie malnutrition: Secondary | ICD-10-CM | POA: Diagnosis not present

## 2022-03-31 DIAGNOSIS — J309 Allergic rhinitis, unspecified: Secondary | ICD-10-CM | POA: Diagnosis not present

## 2022-03-31 DIAGNOSIS — Z431 Encounter for attention to gastrostomy: Secondary | ICD-10-CM | POA: Diagnosis not present

## 2022-03-31 DIAGNOSIS — R1310 Dysphagia, unspecified: Secondary | ICD-10-CM | POA: Diagnosis not present

## 2022-03-31 DIAGNOSIS — K440 Diaphragmatic hernia with obstruction, without gangrene: Secondary | ICD-10-CM | POA: Diagnosis not present

## 2022-03-31 DIAGNOSIS — E43 Unspecified severe protein-calorie malnutrition: Secondary | ICD-10-CM | POA: Diagnosis not present

## 2022-03-31 DIAGNOSIS — I10 Essential (primary) hypertension: Secondary | ICD-10-CM | POA: Diagnosis not present

## 2022-03-31 DIAGNOSIS — K223 Perforation of esophagus: Secondary | ICD-10-CM | POA: Diagnosis not present

## 2022-03-31 DIAGNOSIS — K219 Gastro-esophageal reflux disease without esophagitis: Secondary | ICD-10-CM | POA: Diagnosis not present

## 2022-03-31 DIAGNOSIS — R69 Illness, unspecified: Secondary | ICD-10-CM | POA: Diagnosis not present

## 2022-03-31 DIAGNOSIS — M1711 Unilateral primary osteoarthritis, right knee: Secondary | ICD-10-CM | POA: Diagnosis not present

## 2022-04-01 DIAGNOSIS — E43 Unspecified severe protein-calorie malnutrition: Secondary | ICD-10-CM | POA: Diagnosis not present

## 2022-04-01 DIAGNOSIS — R1310 Dysphagia, unspecified: Secondary | ICD-10-CM | POA: Diagnosis not present

## 2022-04-01 DIAGNOSIS — K223 Perforation of esophagus: Secondary | ICD-10-CM | POA: Diagnosis not present

## 2022-04-02 DIAGNOSIS — R1310 Dysphagia, unspecified: Secondary | ICD-10-CM | POA: Diagnosis not present

## 2022-04-02 DIAGNOSIS — E43 Unspecified severe protein-calorie malnutrition: Secondary | ICD-10-CM | POA: Diagnosis not present

## 2022-04-02 DIAGNOSIS — K223 Perforation of esophagus: Secondary | ICD-10-CM | POA: Diagnosis not present

## 2022-04-03 ENCOUNTER — Encounter: Payer: Self-pay | Admitting: *Deleted

## 2022-04-03 DIAGNOSIS — K223 Perforation of esophagus: Secondary | ICD-10-CM | POA: Diagnosis not present

## 2022-04-03 DIAGNOSIS — R1310 Dysphagia, unspecified: Secondary | ICD-10-CM | POA: Diagnosis not present

## 2022-04-03 DIAGNOSIS — E43 Unspecified severe protein-calorie malnutrition: Secondary | ICD-10-CM | POA: Diagnosis not present

## 2022-04-03 NOTE — Telephone Encounter (Signed)
I have not.  He has not been in touch with me about it since the first note. I believe he was going to get that scheduled when she comes in for her office visit on 9/8.

## 2022-04-04 DIAGNOSIS — R1310 Dysphagia, unspecified: Secondary | ICD-10-CM | POA: Diagnosis not present

## 2022-04-04 DIAGNOSIS — K223 Perforation of esophagus: Secondary | ICD-10-CM | POA: Diagnosis not present

## 2022-04-04 DIAGNOSIS — E43 Unspecified severe protein-calorie malnutrition: Secondary | ICD-10-CM | POA: Diagnosis not present

## 2022-04-05 DIAGNOSIS — R1310 Dysphagia, unspecified: Secondary | ICD-10-CM | POA: Diagnosis not present

## 2022-04-05 DIAGNOSIS — E43 Unspecified severe protein-calorie malnutrition: Secondary | ICD-10-CM | POA: Diagnosis not present

## 2022-04-05 DIAGNOSIS — K223 Perforation of esophagus: Secondary | ICD-10-CM | POA: Diagnosis not present

## 2022-04-06 ENCOUNTER — Other Ambulatory Visit (HOSPITAL_COMMUNITY): Payer: Self-pay | Admitting: *Deleted

## 2022-04-06 DIAGNOSIS — N3001 Acute cystitis with hematuria: Secondary | ICD-10-CM | POA: Diagnosis not present

## 2022-04-06 DIAGNOSIS — E43 Unspecified severe protein-calorie malnutrition: Secondary | ICD-10-CM

## 2022-04-06 DIAGNOSIS — K223 Perforation of esophagus: Secondary | ICD-10-CM | POA: Diagnosis not present

## 2022-04-06 DIAGNOSIS — R1310 Dysphagia, unspecified: Secondary | ICD-10-CM | POA: Diagnosis not present

## 2022-04-06 DIAGNOSIS — R399 Unspecified symptoms and signs involving the genitourinary system: Secondary | ICD-10-CM | POA: Diagnosis not present

## 2022-04-07 ENCOUNTER — Ambulatory Visit (HOSPITAL_COMMUNITY)
Admission: RE | Admit: 2022-04-07 | Discharge: 2022-04-07 | Disposition: A | Discharge status: Home / Self Care | Payer: Medicare HMO | Source: Ambulatory Visit | Attending: Interventional Radiology | Admitting: Interventional Radiology

## 2022-04-07 ENCOUNTER — Other Ambulatory Visit (HOSPITAL_COMMUNITY): Payer: Self-pay | Admitting: Interventional Radiology

## 2022-04-07 DIAGNOSIS — R1310 Dysphagia, unspecified: Secondary | ICD-10-CM | POA: Diagnosis not present

## 2022-04-07 DIAGNOSIS — N390 Urinary tract infection, site not specified: Secondary | ICD-10-CM | POA: Diagnosis not present

## 2022-04-07 DIAGNOSIS — K223 Perforation of esophagus: Secondary | ICD-10-CM | POA: Diagnosis not present

## 2022-04-07 DIAGNOSIS — E43 Unspecified severe protein-calorie malnutrition: Secondary | ICD-10-CM

## 2022-04-07 DIAGNOSIS — Z431 Encounter for attention to gastrostomy: Secondary | ICD-10-CM | POA: Insufficient documentation

## 2022-04-07 HISTORY — PX: IR PATIENT EVAL TECH 0-60 MINS: IMG5564

## 2022-04-07 HISTORY — PX: IR RADIOLOGIST EVAL & MGMT: IMG5224

## 2022-04-07 NOTE — Progress Notes (Addendum)
    Percutaneous gastric tube placed in IR 03/27/22 Pt is using G tube without issue But Very painful to pt and tube feels "exceptionally tight" Site is red and wants IR to evaluate site and tube  O:  site is reddened No sign of overt infection No bleeding No leakage No weeping G tube bumper is very tight to abdomen-- causing some noted indentation into skin Tender to touch Small noted palpable 2x2 cm area of firmness beneath skin site of G tube  I asked Dr Kathlene Cote to also look at site  Agrees there is some minimal cellulitis note at skin site With maybe a small hematoma noted beneath skin  I loosened bumper of G tube at site---pt felt immediately better Placed one split gauze beneath bumper  P: Pt states she is a new antibiotic for UTI starting yesterday She is calling us to let us know name and dosage   If this has good coverage- we will have pt use one thin gauze beneath G tube bumper and the antibiotic Can use a small amount if hydrocortisone to skin site 2x/week or even Desitin to site if she can  She will call today to verify antibiotic name and dose She leaves here with good understanding of plan  ADDENDUM:  Cephalexin 500 mg twice daily per G tube x 7 days This has good skin coverage and she can continue this med and dose She has good understanding

## 2022-04-08 DIAGNOSIS — R1310 Dysphagia, unspecified: Secondary | ICD-10-CM | POA: Diagnosis not present

## 2022-04-08 DIAGNOSIS — E43 Unspecified severe protein-calorie malnutrition: Secondary | ICD-10-CM | POA: Diagnosis not present

## 2022-04-08 DIAGNOSIS — K223 Perforation of esophagus: Secondary | ICD-10-CM | POA: Diagnosis not present

## 2022-04-09 DIAGNOSIS — K223 Perforation of esophagus: Secondary | ICD-10-CM | POA: Diagnosis not present

## 2022-04-09 DIAGNOSIS — E43 Unspecified severe protein-calorie malnutrition: Secondary | ICD-10-CM | POA: Diagnosis not present

## 2022-04-09 DIAGNOSIS — R1310 Dysphagia, unspecified: Secondary | ICD-10-CM | POA: Diagnosis not present

## 2022-04-10 ENCOUNTER — Ambulatory Visit: Payer: Medicare HMO | Admitting: Gastroenterology

## 2022-04-10 ENCOUNTER — Encounter: Payer: Self-pay | Admitting: Gastroenterology

## 2022-04-10 VITALS — BP 102/68 | HR 95 | Ht 63.0 in | Wt 118.8 lb

## 2022-04-10 DIAGNOSIS — R933 Abnormal findings on diagnostic imaging of other parts of digestive tract: Secondary | ICD-10-CM | POA: Diagnosis not present

## 2022-04-10 DIAGNOSIS — R109 Unspecified abdominal pain: Secondary | ICD-10-CM | POA: Diagnosis not present

## 2022-04-10 DIAGNOSIS — R1319 Other dysphagia: Secondary | ICD-10-CM | POA: Diagnosis not present

## 2022-04-10 DIAGNOSIS — K223 Perforation of esophagus: Secondary | ICD-10-CM | POA: Diagnosis not present

## 2022-04-10 DIAGNOSIS — L0390 Cellulitis, unspecified: Secondary | ICD-10-CM

## 2022-04-10 DIAGNOSIS — L03311 Cellulitis of abdominal wall: Secondary | ICD-10-CM | POA: Diagnosis not present

## 2022-04-10 DIAGNOSIS — R1084 Generalized abdominal pain: Secondary | ICD-10-CM | POA: Diagnosis not present

## 2022-04-10 DIAGNOSIS — K2289 Other specified disease of esophagus: Secondary | ICD-10-CM

## 2022-04-10 DIAGNOSIS — R1310 Dysphagia, unspecified: Secondary | ICD-10-CM

## 2022-04-10 DIAGNOSIS — E43 Unspecified severe protein-calorie malnutrition: Secondary | ICD-10-CM | POA: Diagnosis not present

## 2022-04-10 DIAGNOSIS — K449 Diaphragmatic hernia without obstruction or gangrene: Secondary | ICD-10-CM

## 2022-04-10 DIAGNOSIS — N1330 Unspecified hydronephrosis: Secondary | ICD-10-CM | POA: Diagnosis not present

## 2022-04-10 DIAGNOSIS — R634 Abnormal weight loss: Secondary | ICD-10-CM

## 2022-04-10 DIAGNOSIS — K9189 Other postprocedural complications and disorders of digestive system: Secondary | ICD-10-CM

## 2022-04-10 MED ORDER — OXYCODONE HCL 5 MG/5ML PO SOLN: 5.0000 mg | Freq: Four times a day (QID) | ORAL | 0 refills | Status: DC

## 2022-04-10 MED ORDER — CEPHALEXIN 250 MG/5ML PO SUSR: 500.0000 mg | Freq: Two times a day (BID) | ORAL | 0 refills | Status: AC

## 2022-04-10 NOTE — Progress Notes (Signed)
[place] VISIT   Primary Care Provider Lavone Orn, MD [place] [employer] [address] [place] [address] [phone]  Referring Provider Dr. Michail Sermon and Dr. Redmond Pulling  Patient Profile: Julie Day is a 76 y.o. female with a pmh significant for hypertension, thoracic aortic aneurysm, skin cancers (BCC/SCC), prior Nissen fundoplication for GERD, status post laparoscopic revision with hiatal hernia repair (paraesophageal/sliding), esophageal fistula. The patient presents to the [facility] for an evaluation and management of problem(s) noted below:  Problem List No diagnosis found.  History of Present Illness This is the patient's first visit to the outpatient [place] clinic.  She is followed by [facility] gastroenterology (Dr. Deno Etienne).  The patient underwent a paraesophageal hernia repair with just ostomy tube placement in February of this year with Dr. Redmond Pulling.  She was lost to follow-up for the last few months but eventually came back in to the [facility].  She was experiencing significant weight loss and inability to tolerate oral intake with dysphagia to liquids and solids as well as recurrent belching.  She underwent an outpatient EGD with empiric dilation of the GE junction up to 20 mm per report (I do not have access to this record).  Because of progressive weight loss she came into the hospital for further evaluation.  Imaging was performed and was suggestive of a potential contained fluid collection versus distal esophageal leak.  She underwent an upper endoscopy that showed evidence of a small esophageal fistulous appearing tract.  The patient was able to have the endoscope traversed through the GE junction and then into the stomach.  The patient ended up getting a repeat gastrostomy tube placed by interventional radiology in an effort of improving her overall weight.  I discussed this case with Dr. Redmond Pulling briefly a week later after her  discharge and he is considering referral potentially to [facility] for thoracic surgery involvement/care.  I was asked to evaluate the patient for consideration of OVESCO fistula closure endoscopically. Today, the patient is  GI Review of Systems Positive as above Negative for  Pyrosis; Reflux; Regurgitation; Dysphagia; Odynophagia; Globus; Post-prandial cough; Nocturnal cough; Nasal regurgitation; Epigastric pain; Nausea; Vomiting; Hematemesis; Jaundice; Change in Appetite; Early satiety; Abdominal pain; Abdominal bloating; Eructation; Flatulence; Change in BM Frequency; Change in BM Consistency; Constipation; Diarrhea; Incontinence; Urgency; Tenesmus; Hematochezia; Melena  Review of Systems General: Denies fevers/chills/weight loss/night sweats HEENT: Denies oral lesions/sore throat/headaches/visual changes Cardiovascular: Denies chest pain/palpitations Pulmonary: Denies shortness of breath/cough Gastroenterological: See HPI Genitourinary: Denies darkened urine or hematuria Hematological: Denies easy bruising/bleeding Endocrine: Denies temperature intolerance Dermatological: Denies skin changes Psychological: Mood is stable Allergy & Immunology: Denies severe allergic reactions Musculoskeletal: Denies new arthralgias   Medications Current Outpatient Medications  Medication Sig Dispense Refill   acetaminophen (TYLENOL CHILDRENS) 160 MG/5ML suspension Take 20.3-31.3 mLs (650-1,000 mg total) by mouth every 6 (six) hours as needed. 118 mL 0   cetirizine (ZYRTEC) 10 MG tablet Take 10 mg by mouth daily.     MELATONIN PO Take 1 tablet by mouth at bedtime.     oxyCODONE (ROXICODONE) 5 MG/5ML solution Place 5 mLs (5 mg total) into feeding tube every 6 (six) hours as needed for moderate pain. 100 mL 0   pantoprazole (PROTONIX) 40 MG tablet Take 40 mg by mouth daily.     triamterene-hydrochlorothiazide (MAXZIDE-25) 37.5-25 MG per tablet Take 1 tablet by mouth daily.      Calcium  Carb-Cholecalciferol (CALCIUM + D3 PO) Take 1 tablet by mouth daily. (  Patient not taking: Reported on 04/10/2022)     cephALEXin (KEFLEX) 250 MG/5ML suspension Take 500 mg by mouth 2 (two) times daily.     Cyanocobalamin (VITAMIN B-12 PO) Take 1 tablet by mouth daily. (Patient not taking: Reported on 04/10/2022)     meloxicam (MOBIC) 15 MG tablet Take 15 mg by mouth daily as needed for pain (bursitis). (Patient not taking: Reported on 04/10/2022)     nitroGLYCERIN (NITROSTAT) 0.4 MG SL tablet Place 0.4 mg under the tongue every 5 (five) minutes as needed for chest pain. (Patient not taking: Reported on 04/10/2022)     No current facility-administered medications for this visit.    Allergies Allergies  Allergen Reactions   Trexall [Methotrexate] Other (See Comments)    Hair loss Fatigue    Prevnar 13 [Pneumococcal 13-Val Conj Vacc] Itching, Swelling and Rash    Redness around injection site Swelling around injection site Fever     Histories Past Medical History:  Diagnosis Date   09/08/2021    Allergic rhinitis    Aneurysm of thoracic aorta (HCC)    Anginal pain (HCC)    Dx as GERD   Atypical mole 09/23/2017   Left Inner Thigh-Mild   Atypical mole 09/23/2017   Right Axilla-Mild   Atypical mole 09/05/2015   Center Abdomen-Severe (clear) (Dr. Nevada Crane)   Atypical mole 04/14/2019   Right Thigh-Solar Lentigo   Chronic cystitis    Degenerative disc disease    Esophageal fistula    GERD (gastroesophageal reflux disease)    Hemangioma of liver    History of hiatal hernia 2017   Hypertension    SCC (squamous cell carcinoma) 09/05/2015   Left Dorsal Hand-Well Diff Keratoacanthoma (Dr. Nevada Crane) (free)   SCC (squamous cell carcinoma) 04/14/2019   Left Outer Shin-Keratoacanthoma (treatment after biopsy)   SCC (squamous cell carcinoma) 07/21/2019   Right Thigh-Keratoacanthoma (treatment Fluorouracil Cream)   Severe protein-calorie malnutrition ([place])    Squamous cell carcinoma of skin 09/23/2017    Right Upper Shin-Well Diff (Cx3,5FU)   Past Surgical History:  Procedure Laterality Date   ABDOMINAL HYSTERECTOMY     CHOLECYSTECTOMY     COLON RESECTION N/A 05/31/2015   Procedure: LAPAROSCOPIC REPAIR OF HIATAL HERNIA  AND LAPAROSCOPIC NISSEN FUNDOPLICATION;  Surgeon: Fanny Skates, MD;  Location: [facility];  Service: General;  Laterality: N/A;   ESOPHAGEAL MANOMETRY N/A 02/27/2015   Procedure: ESOPHAGEAL MANOMETRY (EM);  Surgeon: Garlan Fair, MD;  Location: [facility];  Service: Endoscopy;  Laterality: N/A;   ESOPHAGOGASTRODUODENOSCOPY N/A 03/25/2022   Procedure: ESOPHAGOGASTRODUODENOSCOPY (EGD);  Surgeon: Wilford Corner, MD;  Location: [facility];  Service: Gastroenterology;  Laterality: N/A;   ESOPHAGOGASTRODUODENOSCOPY (EGD) WITH PROPOFOL N/A 02/26/2015   Procedure: ESOPHAGOGASTRODUODENOSCOPY (EGD) WITH PROPOFOL;  Surgeon: Garlan Fair, MD;  Location: [facility];  Service: Endoscopy;  Laterality: N/A;   HIATAL HERNIA REPAIR  05/31/2015   HIATAL HERNIA REPAIR N/A 09/08/2021   Procedure: LAPAROSCOPIC REVISION HIATAL HERNIA REPAIR WITH FUNDOPLICATION AND MESH;  Surgeon: Kinsinger, Arta Bruce, MD;  Location: [facility];  Service: General;  Laterality: N/A;   IR GASTROSTOMY TUBE MOD SED  03/27/2022   LAPAROSCOPIC INSERTION GASTROSTOMY TUBE N/A 09/08/2021   Procedure: LAPAROSCOPIC INSERTION GASTROSTOMY TUBE;  Surgeon: Mickeal Skinner, MD;  Location: [facility];  Service: General;  Laterality: N/A;  18FR   TOTAL KNEE ARTHROPLASTY Right 10/03/2019   Procedure: RIGHT TOTAL KNEE ARTHROPLASTY;  Surgeon: Melrose Nakayama, MD;  Location: [facility];  Service: Orthopedics;  Laterality: Right;  WISDOM TOOTH EXTRACTION     Social History   Socioeconomic History   Marital status: Married    Spouse name: Not on file   Number of children: 3   Years of education: Not on file   Highest education level: Not on file  Occupational History   Occupation: retired  Tobacco Use   Smoking status:  Some Days    Types: Cigarettes    Last attempt to quit: 12/20/2008    Years since quitting: 13.3   Smokeless tobacco: Never  Vaping Use   Vaping Use: Never used  Substance and Sexual Activity   Alcohol use: No   Drug use: No   Sexual activity: Not on file  Other Topics Concern   Not on file  Social History Narrative   Not on file   Social Determinants of Health   Financial Resource Strain: Not on file  Food Insecurity: Not on file  Transportation Needs: Not on file  Physical Activity: Not on file  Stress: Not on file  Social Connections: Not on file  Intimate Partner Violence: Not on file   Family History  Problem Relation Age of Onset   Hypertension Mother    Hypertension Sister    Stomach cancer Neg Hx    Esophageal cancer Neg Hx    I have reviewed her medical, social, and family history in detail and updated the electronic medical record as necessary.    PHYSICAL EXAMINATION  Ht '5\' 3"'$  (1.6 m)   Wt 118 lb 12.8 oz (53.9 kg)   BMI 21.04 kg/m  Wt Readings from Last 3 Encounters:  04/10/22 118 lb 12.8 oz (53.9 kg)  03/25/22 119 lb 14.9 oz (54.4 kg)  09/08/21 135 lb 12.7 oz (61.6 kg)   GEN: NAD, appears stated age, doesn't appear chronically ill PSYCH: Cooperative, without pressured speech EYE: Conjunctivae pink, sclerae anicteric ENT: MMM, without oral ulcers, no erythema or exudates noted NECK: Supple CV: RR without R/Gs  RESP: CTAB posteriorly, without wheezing GI: NABS, soft, NT/ND, without rebound or guarding, no HSM appreciated GU: DRE shows MSK/EXT: _ edema, no palmar erythema SKIN: No jaundice, no spider angiomata, no concerning rashes NEURO:  Alert & Oriented x 3, no focal deficits, no evidence of asterixis   REVIEW OF DATA  I reviewed the following data at the time of this encounter:  GI Procedures and Studies  August 2023 EGD by [facility] - Fluid in the distal esophagus. - Fistula in the distal esophagus. - Z-line regular, 42 cm from the  incisors. - A Nissen fundoplication was found. The wrap appears intact. - Acute gastritis. - Normal examined duodenum.  Laboratory Studies  Reviewed those in epic and care everywhere  Imaging Studies  August 2023 upper GI series IMPRESSION: 1. Sinus tract into "contained collection" anterior and to the RIGHT of the distal esophagus near the RIGHT hemidiaphragm potentially a fistula from the esophagus into the Nissen gastric wrap based on appearance. Contained distal esophageal leak could also have a similar appearance, and given above findings double contrast study was not performed. Would suggest after clearance of barium contrast, a CT following ingestion of water-soluble contrast for further evaluation to better map the above findings. 2. Narrowing of the fundoplication site such that there is limited passage of contrast media through the fundoplication and into the stomach. 3. Patulous esophagus with signs of dysmotility. Would also suggest correlation with would also suggest correlation with manometry to ensure no secondary or even chronic primary esophageal dysmotility  disorder. 4. Prompt emptying of contrast that did pass into the stomach into the small bowel as discussed.  August 2023 CT chest IMPRESSION: 1. Postsurgical changes of prior Nissen fundoplication with a collection containing air and contrast likely arising from the distal esophagus and located along the right aspect of the distal esophagus/wrap. Marked adjacent inflammatory changes. Findings are compatible with a contained leak, communication with the wrap can not be definitely excluded. 2. Dilated ascending thoracic aorta, measuring up to 4.5 cm. Ascending thoracic aortic aneurysm. Recommend semi-annual imaging followup by CTA or MRA and referral to cardiothoracic surgery if not already obtained. This recommendation follows 2010.  ACCF/AHA/AATS/ACR/ASA/SCA/SCAI/SIR/STS/SVM Guidelines for the Diagnosis and  Management of Patients With Thoracic Aortic Disease. Circulation. 2010; 121: V142-J670. Aortic aneurysm NOS (ICD10-I71.9) 3. Mild ground-glass opacities of the bilateral upper lobes, likely infectious or inflammatory 4. Small scattered solid pulmonary nodules, largest measures 3 mm. Non-contrast chest CT can be considered in 12 months if patient is high-risk. This recommendation follows the consensus statement: Guidelines for Management of Incidental Pulmonary Nodules Detected on CT Images: From the Fleischner Society 2017; Radiology 2017; 284:228-243. 5. Aortic Atherosclerosis (ICD10-I70.0) and Emphysema (ICD10-J43.9).   ASSESSMENT  Ms. Penninger is a 76 y.o. female with a pmh significant for The patient is seen today for evaluation and management of:  No diagnosis found.  ***   PLAN  There are no diagnoses linked to this encounter.   No orders of the defined types were placed in this encounter.   New Prescriptions   No medications on file   Modified Medications   No medications on file    Planned Follow Up No follow-ups on file.   Total Time in Face-to-Face and in Coordination of Care for patient including independent/personal interpretation/review of prior testing, medical history, examination, medication adjustment, communicating results with the patient directly, and documentation within the EHR is ***.   Justice Britain, MD [facility] Office # [phone]

## 2022-04-10 NOTE — Patient Instructions (Addendum)
We have sent the following medications to your pharmacy for you to pick up at your convenience: Keflex 500 mg twice daily x 7 days  Oxycodone 5 mg every 6 hours   _______________________________________________________  If you are age 76 or older, your body mass index should be between 23-30. Your Body mass index is 21.04 kg/m. If this is out of the aforementioned range listed, please consider follow up with your Primary Care Provider.  If you are age 76 or younger, your body mass index should be between 19-25. Your Body mass index is 21.04 kg/m. If this is out of the aformentioned range listed, please consider follow up with your Primary Care Provider.   ________________________________________________________  The [facility] providers would like to encourage you to use [facility] to communicate with providers for non-urgent requests or questions.  Due to long hold times on the telephone, sending your provider a message by [facility] may be a faster and more efficient way to get a response.  Please allow 48 business hours for a response.  Please remember that this is for non-urgent requests.  _______________________________________________________ Due to recent changes in healthcare laws, you may see the results of your imaging and laboratory studies on MyChart before your provider has had a chance to review them.  We understand that in some cases there may be results that are confusing or concerning to you. Not all laboratory results come back in the same time frame and the provider may be waiting for multiple results in order to interpret others.  Please give [place] 48 hours in order for your provider to thoroughly review all the results before contacting the office for clarification of your results.

## 2022-04-11 ENCOUNTER — Encounter: Payer: Self-pay | Admitting: Gastroenterology

## 2022-04-11 DIAGNOSIS — E43 Unspecified severe protein-calorie malnutrition: Secondary | ICD-10-CM | POA: Diagnosis not present

## 2022-04-11 DIAGNOSIS — K2289 Other specified disease of esophagus: Secondary | ICD-10-CM | POA: Insufficient documentation

## 2022-04-11 DIAGNOSIS — K223 Perforation of esophagus: Secondary | ICD-10-CM | POA: Diagnosis not present

## 2022-04-11 DIAGNOSIS — R933 Abnormal findings on diagnostic imaging of other parts of digestive tract: Secondary | ICD-10-CM | POA: Insufficient documentation

## 2022-04-11 DIAGNOSIS — R634 Abnormal weight loss: Secondary | ICD-10-CM | POA: Insufficient documentation

## 2022-04-11 DIAGNOSIS — R1310 Dysphagia, unspecified: Secondary | ICD-10-CM | POA: Diagnosis not present

## 2022-04-11 DIAGNOSIS — R1319 Other dysphagia: Secondary | ICD-10-CM | POA: Insufficient documentation

## 2022-04-11 DIAGNOSIS — L03311 Cellulitis of abdominal wall: Secondary | ICD-10-CM | POA: Insufficient documentation

## 2022-04-11 DIAGNOSIS — R109 Unspecified abdominal pain: Secondary | ICD-10-CM | POA: Insufficient documentation

## 2022-04-12 DIAGNOSIS — E43 Unspecified severe protein-calorie malnutrition: Secondary | ICD-10-CM | POA: Diagnosis not present

## 2022-04-12 DIAGNOSIS — K223 Perforation of esophagus: Secondary | ICD-10-CM | POA: Diagnosis not present

## 2022-04-12 DIAGNOSIS — R1310 Dysphagia, unspecified: Secondary | ICD-10-CM | POA: Diagnosis not present

## 2022-04-13 DIAGNOSIS — K223 Perforation of esophagus: Secondary | ICD-10-CM | POA: Diagnosis not present

## 2022-04-13 DIAGNOSIS — R1310 Dysphagia, unspecified: Secondary | ICD-10-CM | POA: Diagnosis not present

## 2022-04-13 DIAGNOSIS — E43 Unspecified severe protein-calorie malnutrition: Secondary | ICD-10-CM | POA: Diagnosis not present

## 2022-04-14 DIAGNOSIS — E43 Unspecified severe protein-calorie malnutrition: Secondary | ICD-10-CM | POA: Diagnosis not present

## 2022-04-14 DIAGNOSIS — K223 Perforation of esophagus: Secondary | ICD-10-CM | POA: Diagnosis not present

## 2022-04-14 DIAGNOSIS — R1310 Dysphagia, unspecified: Secondary | ICD-10-CM | POA: Diagnosis not present

## 2022-04-15 DIAGNOSIS — R1319 Other dysphagia: Secondary | ICD-10-CM | POA: Diagnosis not present

## 2022-04-15 DIAGNOSIS — K2289 Other specified disease of esophagus: Secondary | ICD-10-CM | POA: Diagnosis not present

## 2022-04-15 DIAGNOSIS — E43 Unspecified severe protein-calorie malnutrition: Secondary | ICD-10-CM | POA: Diagnosis not present

## 2022-04-15 DIAGNOSIS — K223 Perforation of esophagus: Secondary | ICD-10-CM | POA: Diagnosis not present

## 2022-04-15 DIAGNOSIS — R1310 Dysphagia, unspecified: Secondary | ICD-10-CM | POA: Diagnosis not present

## 2022-04-15 DIAGNOSIS — Z931 Gastrostomy status: Secondary | ICD-10-CM | POA: Diagnosis not present

## 2022-04-15 DIAGNOSIS — Z9889 Other specified postprocedural states: Secondary | ICD-10-CM | POA: Diagnosis not present

## 2022-04-16 DIAGNOSIS — R1310 Dysphagia, unspecified: Secondary | ICD-10-CM | POA: Diagnosis not present

## 2022-04-16 DIAGNOSIS — E43 Unspecified severe protein-calorie malnutrition: Secondary | ICD-10-CM | POA: Diagnosis not present

## 2022-04-16 DIAGNOSIS — K223 Perforation of esophagus: Secondary | ICD-10-CM | POA: Diagnosis not present

## 2022-04-17 DIAGNOSIS — R1310 Dysphagia, unspecified: Secondary | ICD-10-CM | POA: Diagnosis not present

## 2022-04-17 DIAGNOSIS — E43 Unspecified severe protein-calorie malnutrition: Secondary | ICD-10-CM | POA: Diagnosis not present

## 2022-04-17 DIAGNOSIS — K223 Perforation of esophagus: Secondary | ICD-10-CM | POA: Diagnosis not present

## 2022-04-18 DIAGNOSIS — E43 Unspecified severe protein-calorie malnutrition: Secondary | ICD-10-CM | POA: Diagnosis not present

## 2022-04-18 DIAGNOSIS — K223 Perforation of esophagus: Secondary | ICD-10-CM | POA: Diagnosis not present

## 2022-04-18 DIAGNOSIS — R1310 Dysphagia, unspecified: Secondary | ICD-10-CM | POA: Diagnosis not present

## 2022-04-19 DIAGNOSIS — K223 Perforation of esophagus: Secondary | ICD-10-CM | POA: Diagnosis not present

## 2022-04-19 DIAGNOSIS — E43 Unspecified severe protein-calorie malnutrition: Secondary | ICD-10-CM | POA: Diagnosis not present

## 2022-04-19 DIAGNOSIS — R1310 Dysphagia, unspecified: Secondary | ICD-10-CM | POA: Diagnosis not present

## 2022-04-20 DIAGNOSIS — E43 Unspecified severe protein-calorie malnutrition: Secondary | ICD-10-CM | POA: Diagnosis not present

## 2022-04-20 DIAGNOSIS — R1310 Dysphagia, unspecified: Secondary | ICD-10-CM | POA: Diagnosis not present

## 2022-04-20 DIAGNOSIS — K223 Perforation of esophagus: Secondary | ICD-10-CM | POA: Diagnosis not present

## 2022-04-21 DIAGNOSIS — R1310 Dysphagia, unspecified: Secondary | ICD-10-CM | POA: Diagnosis not present

## 2022-04-21 DIAGNOSIS — K223 Perforation of esophagus: Secondary | ICD-10-CM | POA: Diagnosis not present

## 2022-04-21 DIAGNOSIS — E43 Unspecified severe protein-calorie malnutrition: Secondary | ICD-10-CM | POA: Diagnosis not present

## 2022-04-22 DIAGNOSIS — E43 Unspecified severe protein-calorie malnutrition: Secondary | ICD-10-CM | POA: Diagnosis not present

## 2022-04-22 DIAGNOSIS — K223 Perforation of esophagus: Secondary | ICD-10-CM | POA: Diagnosis not present

## 2022-04-22 DIAGNOSIS — R1310 Dysphagia, unspecified: Secondary | ICD-10-CM | POA: Diagnosis not present

## 2022-04-23 ENCOUNTER — Encounter (HOSPITAL_COMMUNITY): Payer: Self-pay

## 2022-04-23 DIAGNOSIS — K223 Perforation of esophagus: Secondary | ICD-10-CM | POA: Diagnosis not present

## 2022-04-23 DIAGNOSIS — R1310 Dysphagia, unspecified: Secondary | ICD-10-CM | POA: Diagnosis not present

## 2022-04-23 DIAGNOSIS — E43 Unspecified severe protein-calorie malnutrition: Secondary | ICD-10-CM | POA: Diagnosis not present

## 2022-04-23 NOTE — Procedures (Signed)
I loosened bumper of G tube at site---pt felt immediately better Placed one split gauze beneath bumper

## 2022-04-24 DIAGNOSIS — K223 Perforation of esophagus: Secondary | ICD-10-CM | POA: Diagnosis not present

## 2022-04-24 DIAGNOSIS — R1310 Dysphagia, unspecified: Secondary | ICD-10-CM | POA: Diagnosis not present

## 2022-04-24 DIAGNOSIS — E43 Unspecified severe protein-calorie malnutrition: Secondary | ICD-10-CM | POA: Diagnosis not present

## 2022-04-25 DIAGNOSIS — R1310 Dysphagia, unspecified: Secondary | ICD-10-CM | POA: Diagnosis not present

## 2022-04-25 DIAGNOSIS — E43 Unspecified severe protein-calorie malnutrition: Secondary | ICD-10-CM | POA: Diagnosis not present

## 2022-04-25 DIAGNOSIS — K223 Perforation of esophagus: Secondary | ICD-10-CM | POA: Diagnosis not present

## 2022-05-05 ENCOUNTER — Ambulatory Visit: Payer: Medicare HMO | Admitting: Gastroenterology

## 2022-05-06 DIAGNOSIS — R112 Nausea with vomiting, unspecified: Secondary | ICD-10-CM | POA: Diagnosis not present

## 2022-05-06 DIAGNOSIS — Z931 Gastrostomy status: Secondary | ICD-10-CM | POA: Diagnosis not present

## 2022-05-06 DIAGNOSIS — Z9889 Other specified postprocedural states: Secondary | ICD-10-CM | POA: Diagnosis not present

## 2022-05-06 DIAGNOSIS — K2289 Other specified disease of esophagus: Secondary | ICD-10-CM | POA: Diagnosis not present

## 2022-05-06 DIAGNOSIS — K224 Dyskinesia of esophagus: Secondary | ICD-10-CM | POA: Diagnosis not present

## 2022-05-08 DIAGNOSIS — F17210 Nicotine dependence, cigarettes, uncomplicated: Secondary | ICD-10-CM | POA: Diagnosis not present

## 2022-05-08 DIAGNOSIS — K2900 Acute gastritis without bleeding: Secondary | ICD-10-CM | POA: Diagnosis not present

## 2022-05-08 DIAGNOSIS — K449 Diaphragmatic hernia without obstruction or gangrene: Secondary | ICD-10-CM | POA: Diagnosis not present

## 2022-05-08 DIAGNOSIS — E43 Unspecified severe protein-calorie malnutrition: Secondary | ICD-10-CM | POA: Diagnosis not present

## 2022-05-08 DIAGNOSIS — K2289 Other specified disease of esophagus: Secondary | ICD-10-CM | POA: Diagnosis not present

## 2022-05-08 DIAGNOSIS — I700 Atherosclerosis of aorta: Secondary | ICD-10-CM | POA: Diagnosis not present

## 2022-05-08 DIAGNOSIS — I10 Essential (primary) hypertension: Secondary | ICD-10-CM | POA: Diagnosis not present

## 2022-05-08 DIAGNOSIS — Z23 Encounter for immunization: Secondary | ICD-10-CM | POA: Diagnosis not present

## 2022-05-08 DIAGNOSIS — R1310 Dysphagia, unspecified: Secondary | ICD-10-CM | POA: Diagnosis not present

## 2022-05-08 DIAGNOSIS — R072 Precordial pain: Secondary | ICD-10-CM | POA: Diagnosis not present

## 2022-05-08 DIAGNOSIS — K219 Gastro-esophageal reflux disease without esophagitis: Secondary | ICD-10-CM | POA: Diagnosis not present

## 2022-05-08 DIAGNOSIS — Z9049 Acquired absence of other specified parts of digestive tract: Secondary | ICD-10-CM | POA: Diagnosis not present

## 2022-05-08 DIAGNOSIS — K223 Perforation of esophagus: Secondary | ICD-10-CM | POA: Diagnosis not present

## 2022-05-08 DIAGNOSIS — K224 Dyskinesia of esophagus: Secondary | ICD-10-CM | POA: Diagnosis not present

## 2022-05-08 DIAGNOSIS — J439 Emphysema, unspecified: Secondary | ICD-10-CM | POA: Diagnosis not present

## 2022-05-08 DIAGNOSIS — N3281 Overactive bladder: Secondary | ICD-10-CM | POA: Diagnosis not present

## 2022-05-08 DIAGNOSIS — I7121 Aneurysm of the ascending aorta, without rupture: Secondary | ICD-10-CM | POA: Diagnosis not present

## 2022-05-08 DIAGNOSIS — Z6821 Body mass index (BMI) 21.0-21.9, adult: Secondary | ICD-10-CM | POA: Diagnosis not present

## 2022-05-08 DIAGNOSIS — R634 Abnormal weight loss: Secondary | ICD-10-CM | POA: Diagnosis not present

## 2022-05-08 DIAGNOSIS — Z01818 Encounter for other preprocedural examination: Secondary | ICD-10-CM | POA: Diagnosis not present

## 2022-05-08 DIAGNOSIS — M353 Polymyalgia rheumatica: Secondary | ICD-10-CM | POA: Diagnosis not present

## 2022-05-20 DIAGNOSIS — R1310 Dysphagia, unspecified: Secondary | ICD-10-CM | POA: Diagnosis not present

## 2022-05-20 DIAGNOSIS — E43 Unspecified severe protein-calorie malnutrition: Secondary | ICD-10-CM | POA: Diagnosis not present

## 2022-05-20 DIAGNOSIS — M8580 Other specified disorders of bone density and structure, unspecified site: Secondary | ICD-10-CM | POA: Diagnosis not present

## 2022-05-20 DIAGNOSIS — Z79899 Other long term (current) drug therapy: Secondary | ICD-10-CM | POA: Diagnosis not present

## 2022-05-20 DIAGNOSIS — M7062 Trochanteric bursitis, left hip: Secondary | ICD-10-CM | POA: Diagnosis not present

## 2022-05-20 DIAGNOSIS — K223 Perforation of esophagus: Secondary | ICD-10-CM | POA: Diagnosis not present

## 2022-05-20 DIAGNOSIS — M7061 Trochanteric bursitis, right hip: Secondary | ICD-10-CM | POA: Diagnosis not present

## 2022-06-01 DIAGNOSIS — Z716 Tobacco abuse counseling: Secondary | ICD-10-CM | POA: Diagnosis not present

## 2022-06-01 DIAGNOSIS — Z87440 Personal history of urinary (tract) infections: Secondary | ICD-10-CM | POA: Diagnosis not present

## 2022-06-01 DIAGNOSIS — Z885 Allergy status to narcotic agent status: Secondary | ICD-10-CM | POA: Diagnosis not present

## 2022-06-01 DIAGNOSIS — I10 Essential (primary) hypertension: Secondary | ICD-10-CM | POA: Diagnosis not present

## 2022-06-01 DIAGNOSIS — Z91048 Other nonmedicinal substance allergy status: Secondary | ICD-10-CM | POA: Diagnosis not present

## 2022-06-01 DIAGNOSIS — R69 Illness, unspecified: Secondary | ICD-10-CM | POA: Diagnosis not present

## 2022-06-01 DIAGNOSIS — H8110 Benign paroxysmal vertigo, unspecified ear: Secondary | ICD-10-CM | POA: Diagnosis not present

## 2022-06-01 DIAGNOSIS — K2289 Other specified disease of esophagus: Secondary | ICD-10-CM | POA: Diagnosis not present

## 2022-06-01 DIAGNOSIS — K449 Diaphragmatic hernia without obstruction or gangrene: Secondary | ICD-10-CM | POA: Diagnosis not present

## 2022-06-01 DIAGNOSIS — Z96651 Presence of right artificial knee joint: Secondary | ICD-10-CM | POA: Diagnosis not present

## 2022-06-01 DIAGNOSIS — F17210 Nicotine dependence, cigarettes, uncomplicated: Secondary | ICD-10-CM | POA: Diagnosis not present

## 2022-06-01 DIAGNOSIS — K660 Peritoneal adhesions (postprocedural) (postinfection): Secondary | ICD-10-CM | POA: Diagnosis not present

## 2022-06-01 DIAGNOSIS — N3281 Overactive bladder: Secondary | ICD-10-CM | POA: Diagnosis not present

## 2022-06-01 DIAGNOSIS — Z888 Allergy status to other drugs, medicaments and biological substances status: Secondary | ICD-10-CM | POA: Diagnosis not present

## 2022-06-01 DIAGNOSIS — K219 Gastro-esophageal reflux disease without esophagitis: Secondary | ICD-10-CM | POA: Diagnosis not present

## 2022-06-01 DIAGNOSIS — R1310 Dysphagia, unspecified: Secondary | ICD-10-CM | POA: Diagnosis not present

## 2022-06-01 DIAGNOSIS — Z4682 Encounter for fitting and adjustment of non-vascular catheter: Secondary | ICD-10-CM | POA: Diagnosis not present

## 2022-06-01 DIAGNOSIS — Z79899 Other long term (current) drug therapy: Secondary | ICD-10-CM | POA: Diagnosis not present

## 2022-06-01 DIAGNOSIS — K9189 Other postprocedural complications and disorders of digestive system: Secondary | ICD-10-CM | POA: Diagnosis not present

## 2022-06-01 DIAGNOSIS — R634 Abnormal weight loss: Secondary | ICD-10-CM | POA: Diagnosis not present

## 2022-06-01 DIAGNOSIS — Z887 Allergy status to serum and vaccine status: Secondary | ICD-10-CM | POA: Diagnosis not present

## 2022-06-01 DIAGNOSIS — M353 Polymyalgia rheumatica: Secondary | ICD-10-CM | POA: Diagnosis not present

## 2022-06-03 DIAGNOSIS — E43 Unspecified severe protein-calorie malnutrition: Secondary | ICD-10-CM | POA: Diagnosis not present

## 2022-06-03 DIAGNOSIS — F17200 Nicotine dependence, unspecified, uncomplicated: Secondary | ICD-10-CM | POA: Diagnosis not present

## 2022-06-03 DIAGNOSIS — R69 Illness, unspecified: Secondary | ICD-10-CM | POA: Diagnosis not present

## 2022-06-03 DIAGNOSIS — K223 Perforation of esophagus: Secondary | ICD-10-CM | POA: Diagnosis not present

## 2022-06-03 DIAGNOSIS — R1310 Dysphagia, unspecified: Secondary | ICD-10-CM | POA: Diagnosis not present

## 2022-06-09 DIAGNOSIS — S301XXA Contusion of abdominal wall, initial encounter: Secondary | ICD-10-CM | POA: Diagnosis not present

## 2022-06-09 DIAGNOSIS — S41111A Laceration without foreign body of right upper arm, initial encounter: Secondary | ICD-10-CM | POA: Diagnosis not present

## 2022-06-09 DIAGNOSIS — K223 Perforation of esophagus: Secondary | ICD-10-CM | POA: Diagnosis not present

## 2022-06-09 DIAGNOSIS — N3000 Acute cystitis without hematuria: Secondary | ICD-10-CM | POA: Diagnosis not present

## 2022-06-09 DIAGNOSIS — R1310 Dysphagia, unspecified: Secondary | ICD-10-CM | POA: Diagnosis not present

## 2022-06-09 DIAGNOSIS — E43 Unspecified severe protein-calorie malnutrition: Secondary | ICD-10-CM | POA: Diagnosis not present

## 2022-06-09 DIAGNOSIS — R350 Frequency of micturition: Secondary | ICD-10-CM | POA: Diagnosis not present

## 2022-07-08 DIAGNOSIS — Z8719 Personal history of other diseases of the digestive system: Secondary | ICD-10-CM | POA: Diagnosis not present

## 2022-07-08 DIAGNOSIS — Z9889 Other specified postprocedural states: Secondary | ICD-10-CM | POA: Diagnosis not present

## 2022-07-08 DIAGNOSIS — K219 Gastro-esophageal reflux disease without esophagitis: Secondary | ICD-10-CM | POA: Diagnosis not present

## 2022-07-08 DIAGNOSIS — I10 Essential (primary) hypertension: Secondary | ICD-10-CM | POA: Diagnosis not present

## 2022-07-09 ENCOUNTER — Ambulatory Visit: Payer: Medicare HMO | Admitting: Physician Assistant

## 2022-07-21 DIAGNOSIS — R69 Illness, unspecified: Secondary | ICD-10-CM | POA: Diagnosis not present

## 2022-07-21 DIAGNOSIS — Z8719 Personal history of other diseases of the digestive system: Secondary | ICD-10-CM | POA: Diagnosis not present

## 2022-07-21 DIAGNOSIS — F17210 Nicotine dependence, cigarettes, uncomplicated: Secondary | ICD-10-CM | POA: Diagnosis not present

## 2022-07-21 DIAGNOSIS — Z4659 Encounter for fitting and adjustment of other gastrointestinal appliance and device: Secondary | ICD-10-CM | POA: Diagnosis not present

## 2022-08-19 DIAGNOSIS — Z1231 Encounter for screening mammogram for malignant neoplasm of breast: Secondary | ICD-10-CM | POA: Diagnosis not present

## 2022-08-27 DIAGNOSIS — D485 Neoplasm of uncertain behavior of skin: Secondary | ICD-10-CM | POA: Diagnosis not present

## 2022-08-27 DIAGNOSIS — C44629 Squamous cell carcinoma of skin of left upper limb, including shoulder: Secondary | ICD-10-CM | POA: Diagnosis not present

## 2022-08-27 DIAGNOSIS — L570 Actinic keratosis: Secondary | ICD-10-CM | POA: Diagnosis not present

## 2022-08-27 DIAGNOSIS — X32XXXA Exposure to sunlight, initial encounter: Secondary | ICD-10-CM | POA: Diagnosis not present

## 2022-10-12 DIAGNOSIS — F17200 Nicotine dependence, unspecified, uncomplicated: Secondary | ICD-10-CM | POA: Diagnosis not present

## 2022-10-12 DIAGNOSIS — L821 Other seborrheic keratosis: Secondary | ICD-10-CM | POA: Diagnosis not present

## 2022-10-12 DIAGNOSIS — Z85828 Personal history of other malignant neoplasm of skin: Secondary | ICD-10-CM | POA: Diagnosis not present

## 2022-10-12 DIAGNOSIS — Z08 Encounter for follow-up examination after completed treatment for malignant neoplasm: Secondary | ICD-10-CM | POA: Diagnosis not present

## 2022-10-12 DIAGNOSIS — R69 Illness, unspecified: Secondary | ICD-10-CM | POA: Diagnosis not present

## 2022-10-12 DIAGNOSIS — Z1283 Encounter for screening for malignant neoplasm of skin: Secondary | ICD-10-CM | POA: Diagnosis not present

## 2022-10-20 ENCOUNTER — Ambulatory Visit: Payer: Medicare HMO | Admitting: Dermatology

## 2022-10-20 ENCOUNTER — Ambulatory Visit
Admission: RE | Admit: 2022-10-20 | Discharge: 2022-10-20 | Disposition: A | Discharge status: Home / Self Care | Payer: Medicare HMO | Source: Ambulatory Visit | Attending: Physician Assistant | Admitting: Physician Assistant

## 2022-10-20 ENCOUNTER — Other Ambulatory Visit: Payer: Self-pay | Admitting: Physician Assistant

## 2022-10-20 DIAGNOSIS — R112 Nausea with vomiting, unspecified: Secondary | ICD-10-CM

## 2022-10-20 DIAGNOSIS — R10814 Left lower quadrant abdominal tenderness: Secondary | ICD-10-CM

## 2022-10-20 DIAGNOSIS — K429 Umbilical hernia without obstruction or gangrene: Secondary | ICD-10-CM | POA: Diagnosis not present

## 2022-10-20 DIAGNOSIS — R634 Abnormal weight loss: Secondary | ICD-10-CM | POA: Diagnosis not present

## 2022-10-20 DIAGNOSIS — R194 Change in bowel habit: Secondary | ICD-10-CM

## 2022-10-20 DIAGNOSIS — R030 Elevated blood-pressure reading, without diagnosis of hypertension: Secondary | ICD-10-CM | POA: Diagnosis not present

## 2022-10-20 DIAGNOSIS — K769 Liver disease, unspecified: Secondary | ICD-10-CM | POA: Diagnosis not present

## 2022-10-20 DIAGNOSIS — K5909 Other constipation: Secondary | ICD-10-CM | POA: Diagnosis not present

## 2022-10-20 DIAGNOSIS — K5900 Constipation, unspecified: Secondary | ICD-10-CM | POA: Diagnosis not present

## 2022-10-20 DIAGNOSIS — K5730 Diverticulosis of large intestine without perforation or abscess without bleeding: Secondary | ICD-10-CM | POA: Diagnosis not present

## 2022-10-20 MED ORDER — IOPAMIDOL (ISOVUE-300) INJECTION 61%
100.0000 mL | Freq: Once | INTRAVENOUS | Status: AC | PRN
  Administered 2022-10-20: 100 mL via INTRAVENOUS

## 2022-11-05 DIAGNOSIS — K5909 Other constipation: Secondary | ICD-10-CM | POA: Diagnosis not present

## 2022-11-05 DIAGNOSIS — I10 Essential (primary) hypertension: Secondary | ICD-10-CM | POA: Diagnosis not present

## 2022-11-05 DIAGNOSIS — K222 Esophageal obstruction: Secondary | ICD-10-CM | POA: Diagnosis not present

## 2022-11-13 DIAGNOSIS — M7989 Other specified soft tissue disorders: Secondary | ICD-10-CM | POA: Diagnosis not present

## 2022-11-13 DIAGNOSIS — M7061 Trochanteric bursitis, right hip: Secondary | ICD-10-CM | POA: Diagnosis not present

## 2022-11-13 DIAGNOSIS — M7062 Trochanteric bursitis, left hip: Secondary | ICD-10-CM | POA: Diagnosis not present

## 2022-11-13 DIAGNOSIS — M8589 Other specified disorders of bone density and structure, multiple sites: Secondary | ICD-10-CM | POA: Diagnosis not present

## 2022-12-11 DIAGNOSIS — K5909 Other constipation: Secondary | ICD-10-CM | POA: Diagnosis not present

## 2022-12-11 DIAGNOSIS — R110 Nausea: Secondary | ICD-10-CM | POA: Diagnosis not present

## 2022-12-11 DIAGNOSIS — R1319 Other dysphagia: Secondary | ICD-10-CM | POA: Diagnosis not present

## 2022-12-11 DIAGNOSIS — R918 Other nonspecific abnormal finding of lung field: Secondary | ICD-10-CM | POA: Diagnosis not present

## 2022-12-11 DIAGNOSIS — Z9889 Other specified postprocedural states: Secondary | ICD-10-CM | POA: Diagnosis not present

## 2022-12-11 DIAGNOSIS — Z720 Tobacco use: Secondary | ICD-10-CM | POA: Diagnosis not present

## 2022-12-25 DIAGNOSIS — R5383 Other fatigue: Secondary | ICD-10-CM | POA: Diagnosis not present

## 2022-12-25 DIAGNOSIS — F5101 Primary insomnia: Secondary | ICD-10-CM | POA: Diagnosis not present

## 2022-12-25 DIAGNOSIS — Z0000 Encounter for general adult medical examination without abnormal findings: Secondary | ICD-10-CM | POA: Diagnosis not present

## 2022-12-25 DIAGNOSIS — M4696 Unspecified inflammatory spondylopathy, lumbar region: Secondary | ICD-10-CM | POA: Diagnosis not present

## 2022-12-25 DIAGNOSIS — R918 Other nonspecific abnormal finding of lung field: Secondary | ICD-10-CM | POA: Diagnosis not present

## 2022-12-25 DIAGNOSIS — M85859 Other specified disorders of bone density and structure, unspecified thigh: Secondary | ICD-10-CM | POA: Diagnosis not present

## 2022-12-25 DIAGNOSIS — M8580 Other specified disorders of bone density and structure, unspecified site: Secondary | ICD-10-CM | POA: Diagnosis not present

## 2022-12-25 DIAGNOSIS — F17200 Nicotine dependence, unspecified, uncomplicated: Secondary | ICD-10-CM | POA: Diagnosis not present

## 2022-12-25 DIAGNOSIS — J3089 Other allergic rhinitis: Secondary | ICD-10-CM | POA: Diagnosis not present

## 2022-12-25 DIAGNOSIS — I700 Atherosclerosis of aorta: Secondary | ICD-10-CM | POA: Diagnosis not present

## 2022-12-25 DIAGNOSIS — E7800 Pure hypercholesterolemia, unspecified: Secondary | ICD-10-CM | POA: Diagnosis not present

## 2022-12-25 DIAGNOSIS — Z1331 Encounter for screening for depression: Secondary | ICD-10-CM | POA: Diagnosis not present

## 2022-12-25 DIAGNOSIS — I10 Essential (primary) hypertension: Secondary | ICD-10-CM | POA: Diagnosis not present

## 2022-12-25 DIAGNOSIS — K219 Gastro-esophageal reflux disease without esophagitis: Secondary | ICD-10-CM | POA: Diagnosis not present

## 2023-02-25 DIAGNOSIS — R351 Nocturia: Secondary | ICD-10-CM | POA: Diagnosis not present

## 2023-02-25 DIAGNOSIS — N3946 Mixed incontinence: Secondary | ICD-10-CM | POA: Diagnosis not present

## 2023-02-25 DIAGNOSIS — N3020 Other chronic cystitis without hematuria: Secondary | ICD-10-CM | POA: Diagnosis not present

## 2023-03-05 ENCOUNTER — Other Ambulatory Visit: Payer: Self-pay | Admitting: Physician Assistant

## 2023-03-05 DIAGNOSIS — R918 Other nonspecific abnormal finding of lung field: Secondary | ICD-10-CM

## 2023-05-27 ENCOUNTER — Ambulatory Visit
Admission: RE | Admit: 2023-05-27 | Discharge: 2023-05-27 | Disposition: A | Discharge status: Home / Self Care | Payer: Medicare HMO | Source: Ambulatory Visit | Attending: Physician Assistant | Admitting: Physician Assistant

## 2023-05-27 DIAGNOSIS — R918 Other nonspecific abnormal finding of lung field: Secondary | ICD-10-CM | POA: Diagnosis not present

## 2023-05-27 DIAGNOSIS — I7143 Infrarenal abdominal aortic aneurysm, without rupture: Secondary | ICD-10-CM | POA: Diagnosis not present

## 2023-05-27 DIAGNOSIS — I7121 Aneurysm of the ascending aorta, without rupture: Secondary | ICD-10-CM | POA: Diagnosis not present

## 2023-05-27 DIAGNOSIS — J439 Emphysema, unspecified: Secondary | ICD-10-CM | POA: Diagnosis not present

## 2023-05-27 MED ORDER — IOPAMIDOL (ISOVUE-300) INJECTION 61%
200.0000 mL | Freq: Once | INTRAVENOUS | Status: AC | PRN
Start: 2023-05-27 — End: 2023-05-27
  Administered 2023-05-27: 75 mL via INTRAVENOUS

## 2023-05-31 DIAGNOSIS — K5900 Constipation, unspecified: Secondary | ICD-10-CM | POA: Diagnosis not present

## 2023-05-31 DIAGNOSIS — Z860101 Personal history of adenomatous and serrated colon polyps: Secondary | ICD-10-CM | POA: Diagnosis not present

## 2023-06-07 DIAGNOSIS — K648 Other hemorrhoids: Secondary | ICD-10-CM | POA: Diagnosis not present

## 2023-06-07 DIAGNOSIS — Z09 Encounter for follow-up examination after completed treatment for conditions other than malignant neoplasm: Secondary | ICD-10-CM | POA: Diagnosis not present

## 2023-06-07 DIAGNOSIS — K621 Rectal polyp: Secondary | ICD-10-CM | POA: Diagnosis not present

## 2023-06-07 DIAGNOSIS — Z860100 Personal history of colon polyps, unspecified: Secondary | ICD-10-CM | POA: Diagnosis not present

## 2023-06-07 DIAGNOSIS — K5730 Diverticulosis of large intestine without perforation or abscess without bleeding: Secondary | ICD-10-CM | POA: Diagnosis not present

## 2023-06-07 DIAGNOSIS — K635 Polyp of colon: Secondary | ICD-10-CM | POA: Diagnosis not present

## 2023-06-09 DIAGNOSIS — K621 Rectal polyp: Secondary | ICD-10-CM | POA: Diagnosis not present

## 2023-06-09 DIAGNOSIS — K635 Polyp of colon: Secondary | ICD-10-CM | POA: Diagnosis not present

## 2023-06-29 DIAGNOSIS — I7121 Aneurysm of the ascending aorta, without rupture: Secondary | ICD-10-CM | POA: Diagnosis not present

## 2023-06-29 DIAGNOSIS — Z716 Tobacco abuse counseling: Secondary | ICD-10-CM | POA: Diagnosis not present

## 2023-06-29 DIAGNOSIS — M545 Low back pain, unspecified: Secondary | ICD-10-CM | POA: Diagnosis not present

## 2023-06-29 DIAGNOSIS — I209 Angina pectoris, unspecified: Secondary | ICD-10-CM | POA: Diagnosis not present

## 2023-06-29 DIAGNOSIS — F17200 Nicotine dependence, unspecified, uncomplicated: Secondary | ICD-10-CM | POA: Diagnosis not present

## 2023-06-29 DIAGNOSIS — I10 Essential (primary) hypertension: Secondary | ICD-10-CM | POA: Diagnosis not present

## 2023-06-29 DIAGNOSIS — G8929 Other chronic pain: Secondary | ICD-10-CM | POA: Diagnosis not present

## 2023-06-29 DIAGNOSIS — I7143 Infrarenal abdominal aortic aneurysm, without rupture: Secondary | ICD-10-CM | POA: Diagnosis not present

## 2023-06-29 DIAGNOSIS — J432 Centrilobular emphysema: Secondary | ICD-10-CM | POA: Diagnosis not present

## 2023-06-29 DIAGNOSIS — J302 Other seasonal allergic rhinitis: Secondary | ICD-10-CM | POA: Diagnosis not present

## 2023-07-12 NOTE — [place] Notes (Unsigned)
[address]       [place] [address]             [phone]        Julie Day [id number] 1945/12/24  History of Present Illness: Julie Day is a 77 year old woman with a past medical history of HTN, GERD, and paraesophageal hernia (s/p laparoscopic repair, revision and redo repair with fundoplication with replacement of G tube). She was noted to have a 4.5cmx4.4cm ascending thoracic aortic aneurysm on chest CT on 03/23/22 as well as small scattered lung nodules. A follow up CT chest was rranged for the pulmonary nodules on 05/27/23 and noted diffuse aneurysmal enlargement including a 4.8cm ascending aortic aneurysm and a proximal infrarenal abdominal aortic aneurysm measuring up to 3.5cm.   The patient reports ***     Current Outpatient Medications on File Prior to Visit  Medication Sig Dispense Refill   acetaminophen (TYLENOL CHILDRENS) 160 MG/5ML suspension Take 20.3-31.3 mLs (650-1,000 mg total) by mouth every 6 (six) hours as needed. 118 mL 0   Calcium Carb-Cholecalciferol (CALCIUM + D3 PO) Take 1 tablet by mouth daily. (Patient not taking: Reported on 04/10/2022)     cetirizine (ZYRTEC) 10 MG tablet Take 10 mg by mouth daily.     Cyanocobalamin (VITAMIN B-12 PO) Take 1 tablet by mouth daily. (Patient not taking: Reported on 04/10/2022)     MELATONIN PO Take 1 tablet by mouth at bedtime.     meloxicam (MOBIC) 15 MG tablet Take 15 mg by mouth daily as needed for pain (bursitis). (Patient not taking: Reported on 04/10/2022)     nitroGLYCERIN (NITROSTAT) 0.4 MG SL tablet Place 0.4 mg under the tongue every 5 (five) minutes as needed for chest pain. (Patient not taking: Reported on 04/10/2022)     oxyCODONE (ROXICODONE) 5 MG/5ML solution Place 5 mLs (5 mg total) into feeding tube every 6 (six) hours as needed for moderate pain. 100 mL 0   oxyCODONE (ROXICODONE) 5 MG/5ML solution Place 5 mLs (5 mg total) into feeding tube every 6 (six) hours. 100 mL 0   pantoprazole  (PROTONIX) 40 MG tablet Take 40 mg by mouth daily.     triamterene-hydrochlorothiazide (MAXZIDE-25) 37.5-25 MG per tablet Take 1 tablet by mouth daily.      No current facility-administered medications on file prior to visit.   Vitals:  ROS   Physical Exam General Neuro CV Pulm GI Extremities  CT Results: CLINICAL DATA:  Follow-up pulmonary nodule   EXAM: CT CHEST WITH CONTRAST   TECHNIQUE: Multidetector CT imaging of the chest was performed during intravenous contrast administration.   RADIATION DOSE REDUCTION: This exam was performed according to the departmental dose-optimization program which includes automated exposure control, adjustment of the mA and/or kV according to patient size and/or use of iterative reconstruction technique.   CONTRAST:  75mL ISOVUE-300 IOPAMIDOL (ISOVUE-300) INJECTION 61%   COMPARISON:  Chest CT 03/23/2022   FINDINGS: Cardiovascular: Mild aortic atherosclerosis. Diffuse aneurysmal enlargement of the aorta. Ascending aorta measures 4.8 cm on series 2, image 59, grossly unchanged when measured in similar fashion. Aortic arch measures 3.1 cm. Proximal descending thoracic aorta measures 3.5 cm. Suprarenal abdominal aorta measures 3.5 cm. Normal cardiac size. Coronary vascular calcifications. No pericardial effusion   Mediastinum/Nodes: Line trachea. No thyroid mass. No suspicious lymph nodes. Patulous esophagus with mild air and fluid distension.   Lungs/Pleura: Emphysema. Similar appearance of mild patchy ground-glass densities most evident in the upper lobes  with chronic pleuroparenchymal scarring at the apices. Scattered small pulmonary nodules measuring up to 3 mm in the left upper lobe, series 3, image 36. No new suspicious pulmonary nodules. No pleural effusion or pneumothorax   Upper Abdomen: No acute findings. Postsurgical changes at the GE junction corresponding to history of fundoplication.   Musculoskeletal: No acute  osseous abnormality.   IMPRESSION: 1. Similar appearance of small scattered pulmonary nodules measuring up to 3 mm. No specific imaging follow-up is recommended. 2. Emphysema. Similar distribution of mild ground-glass densities in the upper lobes possibly chronic lung disease. 3. Diffuse aneurysmal dilatation of the aorta. Ascending aortic diameter up to 4.8 cm. Ascending thoracic aortic aneurysm. Recommend semi-annual imaging followup by CTA or MRA and referral to cardiothoracic surgery if not already obtained. This recommendation follows 2010 ACCF/AHA/AATS/ACR/ASA/SCA/SCAI/SIR/STS/SVM Guidelines for the Diagnosis and Management of Patients With Thoracic Aortic Disease. Circulation. 2010; 121: Z610-R604. Aortic aneurysm NOS (ICD10-I71.9) 4. Proximal infrarenal abdominal aortic aneurysm measuring up to 3.5 cm. Recommend follow-up ultrasound every 3 years. (Ref.: J Vasc Surg. 2018; 67:2-77 and J Am Coll Radiol 2013;10(10):789-794.)   Aortic aneurysm NOS (ICD10-I71.9).Aortic Atherosclerosis (ICD10-I70.0) and Emphysema (ICD10-J43.9).     Electronically Signed   By: Jasmine Pang M.D.   On: 06/20/2023 18:12    Impression and Plan: TAA: Julie Day presents to our clinic with a 4.8cm ascending aortic aneurysm.  Echocardiogram shows a *** valve without evidence of regurgitation.  We discussed the natural history and and risk factors for growth of ascending aortic aneurysms.  We covered the importance of smoking cessation, tight blood pressure control, refraining from lifting heavy objects, and avoiding fluoroquinolones.  The patient is aware of signs and symptoms of aortic dissection and when to present to the emergency department.  We will continue surveillance and a repeat ** was ordered for ***. 4 months from now since 6 month surveillance scans are recommended and her last CT scan was on 10/24.  Pulmonary nodules: CT with evidence of empysema. Stable scattered pulmonary nodules in the  left upper lobe measuring up to 3mm and similar appearance of mild patchy ground-glass densities most evident in the upper lobes with chronic pleuroparenchymal scarring at the apices. Will continue to monitor on surveillance scans for her aneurysm.    Echo Vascular surgery referral?    Risk Modification:  Statin:  ***  Smoking cessation instruction/counseling given:  {CHL AMB [facility] SMOKING CESSATION COUNSELING:20758}  Patient was counseled on importance of Blood Pressure Control.  Despite Medical intervention if the patient notices persistently elevated blood pressure readings.  They are instructed to contact their Primary Care Physician  Please avoid use of Fluoroquinolones as this can potentially increase your risk of Aortic Rupture and/or Dissection  Patient educated on signs and symptoms of Aortic Dissection, handout also provided in AVS  Jenny Reichmann, PA-C 07/12/23

## 2023-07-21 ENCOUNTER — Institutional Professional Consult (permissible substitution): Payer: Medicare HMO | Admitting: Physician Assistant

## 2023-07-21 ENCOUNTER — Encounter: Payer: Self-pay | Admitting: Physician Assistant

## 2023-07-21 VITALS — BP 109/70 | HR 93 | Resp 18 | Ht 63.0 in | Wt 115.0 lb

## 2023-07-21 DIAGNOSIS — I7121 Aneurysm of the ascending aorta, without rupture: Secondary | ICD-10-CM | POA: Diagnosis not present

## 2023-07-21 NOTE — Patient Instructions (Signed)

## 2023-07-22 DIAGNOSIS — Z20822 Contact with and (suspected) exposure to covid-19: Secondary | ICD-10-CM | POA: Diagnosis not present

## 2023-07-22 DIAGNOSIS — U071 COVID-19: Secondary | ICD-10-CM | POA: Diagnosis not present

## 2023-08-03 DIAGNOSIS — I10 Essential (primary) hypertension: Secondary | ICD-10-CM | POA: Diagnosis not present

## 2023-08-04 NOTE — Progress Notes (Signed)
 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   On exam, Junior was doing well, accompanied by her husband.  A native of [place], she has lived her entire life.  A kindergarten teacher by trade, she is now retired.  Adreanne's mother died of an ascending aortic aneurysm, and was checked recently noting an ascending aortic aneurysm, as well as mild aneurysmal dilation of the descending aorta to 3.5 cm.  She denies acute onset back, chest, abdominal pain.  Prior surgeries include hiatal hernia surgery x 2. Denies symptoms of claudication, ischemic rest pain, tissue loss. Daily smoker.   Past Medical History:  Diagnosis Date   09/08/2021    Allergic rhinitis    Aneurysm of thoracic aorta (HCC)    Anginal pain (HCC)    Dx as GERD   Atypical mole 09/23/2017   Left Inner Thigh-Mild   Atypical mole 09/23/2017   Right Axilla-Mild   Atypical mole 09/05/2015   Center Abdomen-Severe (clear) (Dr. Shona)   Atypical mole 04/14/2019   Right Thigh-Solar Lentigo   Chronic cystitis    Degenerative disc disease    Esophageal fistula    GERD (gastroesophageal reflux disease)    Hemangioma of liver    History of hiatal hernia 2017   Hypertension    SCC (squamous cell carcinoma) 09/05/2015   Left Dorsal Hand-Well Diff Keratoacanthoma (Dr. Shona) (free)   SCC (squamous cell carcinoma) 04/14/2019   Left Outer Shin-Keratoacanthoma (treatment after biopsy)   SCC (squamous cell carcinoma) 07/21/2019   Right Thigh-Keratoacanthoma (treatment Fluorouracil Cream)   Severe protein-calorie malnutrition (HCC)    Squamous cell carcinoma of skin 09/23/2017   Right Upper Shin-Well Diff (Cx3,5FU)    Past Surgical History:  Procedure Laterality Date   ABDOMINAL HYSTERECTOMY      CHOLECYSTECTOMY     COLON RESECTION N/A 05/31/2015   Procedure: LAPAROSCOPIC REPAIR OF HIATAL HERNIA  AND LAPAROSCOPIC NISSEN FUNDOPLICATION;  Surgeon: Elon Pacini, MD;  Location: [facility] OR;  Service: General;  Laterality: N/A;   ESOPHAGEAL MANOMETRY N/A 02/27/2015   Procedure: ESOPHAGEAL MANOMETRY (EM);  Surgeon: Gladis POUR Louder, MD;  Location: [facility] ENDOSCOPY;  Service: Endoscopy;  Laterality: N/A;   ESOPHAGOGASTRODUODENOSCOPY N/A 03/25/2022   Procedure: ESOPHAGOGASTRODUODENOSCOPY (EGD);  Surgeon: Dianna Specking, MD;  Location: [facility] ENDOSCOPY;  Service: Gastroenterology;  Laterality: N/A;   ESOPHAGOGASTRODUODENOSCOPY (EGD) WITH PROPOFOL  N/A 02/26/2015   Procedure: ESOPHAGOGASTRODUODENOSCOPY (EGD) WITH PROPOFOL ;  Surgeon: Gladis POUR Louder, MD;  Location: [facility] ENDOSCOPY;  Service: Endoscopy;  Laterality: N/A;   HIATAL HERNIA REPAIR  05/31/2015   HIATAL HERNIA REPAIR N/A 09/08/2021   Procedure: LAPAROSCOPIC REVISION HIATAL HERNIA REPAIR WITH FUNDOPLICATION AND MESH;  Surgeon: Kinsinger, Herlene Righter, MD;  Location: [facility] ORS;  Service: General;  Laterality: N/A;   IR GASTROSTOMY TUBE MOD SED  03/27/2022   IR PATIENT EVAL TECH 0-60 MINS  04/07/2022   IR RADIOLOGIST EVAL & MGMT  04/07/2022   LAPAROSCOPIC INSERTION GASTROSTOMY TUBE N/A 09/08/2021   Procedure: LAPAROSCOPIC INSERTION GASTROSTOMY TUBE;  Surgeon: Stevie Herlene Righter, MD;  Location: [facility] ORS;  Service: General;  Laterality: N/A;  18FR   TOTAL KNEE ARTHROPLASTY Right 10/03/2019   Procedure: RIGHT TOTAL KNEE ARTHROPLASTY;  Surgeon: Sheril Coy, MD;  Location: [facility] ORS;  Service: Orthopedics;  Laterality: Right;   WISDOM  TOOTH EXTRACTION      Social History   Socioeconomic History   Marital status: Married    Spouse name: Not on file   Number of children: 3   Years of education: Not on file   Highest education level: Not on file  Occupational History   Occupation: retired  Tobacco Use   Smoking status: Some Days    Current packs/day: 0.00     Types: Cigarettes    Last attempt to quit: 12/20/2008    Years since quitting: 14.6   Smokeless tobacco: Never  Vaping Use   Vaping status: Never Used  Substance and Sexual Activity   Alcohol use: No   Drug use: No   Sexual activity: Not on file  Other Topics Concern   Not on file  Social History Narrative   Not on file   Social Drivers of Health   Financial Resource Strain: Low Risk  (06/08/2022)   Received from [facility], [facility]   Overall Financial Resource Strain (CARDIA)    Difficulty of Paying Living Expenses: Not hard at all  Food Insecurity: No Food Insecurity (06/08/2022)   Received from [facility], [facility]   Hunger Vital Sign    Worried About Running Out of Food in the Last Year: Never true    Ran Out of Food in the Last Year: Never true  Transportation Needs: No Transportation Needs (06/08/2022)   Received from [facility], [facility]   [facility] - Transportation    In the past 12 months, has lack of transportation kept you from medical appointments or from getting medications?: No    Lack of Transportation (Non-Medical): No  Physical Activity: Not on file  Stress: Not on file  Social Connections: Not on file  Intimate Partner Violence: Not on file   Family History  Problem Relation Age of Onset   Hypertension Mother    Hypertension Sister    Stomach cancer Neg Hx    Esophageal cancer Neg Hx    Colon cancer Neg Hx    Inflammatory bowel disease Neg Hx    Liver disease Neg Hx    Pancreatic cancer Neg Hx    Rectal cancer Neg Hx     Current Outpatient Medications  Medication Sig Dispense Refill   acetaminophen  (TYLENOL  CHILDRENS) 160 MG/5ML suspension Take 20.3-31.3 mLs (650-1,000 mg total) by mouth every 6 (six) hours as needed. 118 mL 0   cetirizine (ZYRTEC) 10 MG tablet Take 10 mg by mouth daily.     MELATONIN PO Take 1 tablet by mouth at  bedtime.     meloxicam (MOBIC) 15 MG tablet Take 15 mg by mouth daily as needed for pain (bursitis).     nitroGLYCERIN  (NITROSTAT ) 0.4 MG SL tablet Place 0.4 mg under the tongue every 5 (five) minutes as needed for chest pain.     triamterene-hydrochlorothiazide (MAXZIDE-25) 37.5-25 MG per tablet Take 1 tablet by mouth daily.      No current facility-administered medications for this visit.    Allergies  Allergen Reactions   Trexall [Methotrexate] Other (See Comments)    Hair loss Fatigue    Prevnar 13 [Pneumococcal 13-Val Conj Vacc] Itching, Swelling and Rash    Redness around injection site Swelling around injection site Fever      REVIEW OF SYSTEMS:  [X]  denotes positive finding, [ ]  denotes negative finding Cardiac  Comments:  Chest pain or chest pressure:  Shortness of breath upon exertion:    Short of breath when lying flat:    Irregular heart rhythm:        Vascular    Pain in calf, thigh, or hip brought on by ambulation:    Pain in feet at night that wakes you up from your sleep:     Blood clot in your veins:    Leg swelling:         Pulmonary    Oxygen at home:    Productive cough:     Wheezing:         Neurologic    Sudden weakness in arms or legs:     Sudden numbness in arms or legs:     Sudden onset of difficulty speaking or slurred speech:    Temporary loss of vision in one eye:     Problems with dizziness:         Gastrointestinal    Blood in stool:     Vomited blood:         Genitourinary    Burning when urinating:     Blood in urine:        Psychiatric    Major depression:         Hematologic    Bleeding problems:    Problems with blood clotting too easily:        Skin    Rashes or ulcers:        Constitutional    Fever or chills:      PHYSICAL EXAMINATION:  There were no vitals filed for this visit.  General:  WDWN in NAD; vital signs documented above Gait: Not observed HENT: WNL, normocephalic Pulmonary: normal non-labored  breathing , without wheezing Cardiac: regular HR Abdomen: soft, NT, no masses Skin: without rashes Vascular Exam/Pulses:  Right Left  Radial 2+ (normal) 2+ (normal)  Ulnar    Femoral    Popliteal Nonpalpable Nonpalpable  DP 2+ 2+  PT     Extremities: without ischemic changes, without Gangrene , without cellulitis; without open wounds;  Musculoskeletal: no muscle wasting or atrophy  Neurologic: A&O X 3;  No focal weakness or paresthesias are detected Psychiatric:  The pt has Normal affect.   Non-Invasive Vascular Imaging:    Proximal descending thoracic aorta measures 3.5 cm. Suprarenal abdominal aorta measures 3.5 cm.     ASSESSMENT/PLAN: Vianey Caniglia is a 78 y.o. 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  Fortunately, Zahava remains asymptomatic.   We had a long conversation regarding the above.  Size criteria for descending aortic repair is 5.5-6 cm.  I think the likelihood that she degenerates to the size is low, but is increased with her current smoking.  She is aware that she will likely require ascending aortic repair prior to any intervention on the descending aorta.  My plan is to follow her in 2-year increments.  I have ordered a CT scan for that time.  Being that the ascending aorta is measuring 4.8 cm, the descending will be assessed at the 62m follow up increment.   We discussed the importance of blood pressure control. Asked that she refrain from using fluoroquinolones as they can increase the risk of aortic rupture and dissection.   Fonda FORBES Rim, MD [facility] [phone]

## 2023-08-05 ENCOUNTER — Encounter: Payer: Self-pay | Admitting: Vascular Surgery

## 2023-08-05 ENCOUNTER — Ambulatory Visit: Payer: Medicare HMO | Admitting: Vascular Surgery

## 2023-08-05 VITALS — BP 122/77 | HR 71 | Temp 97.9°F | Resp 16 | Ht 63.0 in | Wt 118.0 lb

## 2023-08-05 DIAGNOSIS — I719 Aortic aneurysm of unspecified site, without rupture: Secondary | ICD-10-CM | POA: Diagnosis not present

## 2023-08-25 DIAGNOSIS — Z1231 Encounter for screening mammogram for malignant neoplasm of breast: Secondary | ICD-10-CM | POA: Diagnosis not present

## 2023-08-26 DIAGNOSIS — H6123 Impacted cerumen, bilateral: Secondary | ICD-10-CM | POA: Diagnosis not present

## 2023-08-26 DIAGNOSIS — J432 Centrilobular emphysema: Secondary | ICD-10-CM | POA: Diagnosis not present

## 2023-09-03 ENCOUNTER — Encounter: Payer: Medicare HMO | Admitting: Vascular Surgery

## 2023-09-30 DIAGNOSIS — Z08 Encounter for follow-up examination after completed treatment for malignant neoplasm: Secondary | ICD-10-CM | POA: Diagnosis not present

## 2023-09-30 DIAGNOSIS — X32XXXD Exposure to sunlight, subsequent encounter: Secondary | ICD-10-CM | POA: Diagnosis not present

## 2023-09-30 DIAGNOSIS — L821 Other seborrheic keratosis: Secondary | ICD-10-CM | POA: Diagnosis not present

## 2023-09-30 DIAGNOSIS — Z85828 Personal history of other malignant neoplasm of skin: Secondary | ICD-10-CM | POA: Diagnosis not present

## 2023-09-30 DIAGNOSIS — D225 Melanocytic nevi of trunk: Secondary | ICD-10-CM | POA: Diagnosis not present

## 2023-09-30 DIAGNOSIS — C44729 Squamous cell carcinoma of skin of left lower limb, including hip: Secondary | ICD-10-CM | POA: Diagnosis not present

## 2023-09-30 DIAGNOSIS — L570 Actinic keratosis: Secondary | ICD-10-CM | POA: Diagnosis not present

## 2023-10-03 DIAGNOSIS — N390 Urinary tract infection, site not specified: Secondary | ICD-10-CM | POA: Diagnosis not present

## 2023-10-03 DIAGNOSIS — R350 Frequency of micturition: Secondary | ICD-10-CM | POA: Diagnosis not present

## 2023-10-11 ENCOUNTER — Other Ambulatory Visit: Payer: Self-pay | Admitting: Surgery

## 2023-10-11 DIAGNOSIS — I7121 Aneurysm of the ascending aorta, without rupture: Secondary | ICD-10-CM

## 2023-10-12 DIAGNOSIS — N3020 Other chronic cystitis without hematuria: Secondary | ICD-10-CM | POA: Diagnosis not present

## 2023-10-12 DIAGNOSIS — R8271 Bacteriuria: Secondary | ICD-10-CM | POA: Diagnosis not present

## 2023-10-19 NOTE — Progress Notes (Unsigned)
 [address]       [place] [address]             [phone]        Julie Day [id number] [birth year]  History of Present Illness: Ms. Stamp is a 78 year old woman with a past medical history of HTN, GERD, and paraesophageal hernia (s/p laparoscopic repair, revision and redo repair with fundoplication with replacement of G tube). She was noted to have a 4.5cmx4.4cm ascending thoracic aortic aneurysm on chest CT on 03/23/22 as well as small scattered lung nodules. A follow up CT chest was arranged for the pulmonary nodules on 05/27/23 and noted diffuse aneurysmal enlargement including a 4.8cm ascending aortic aneurysm and a proximal infrarenal abdominal aortic aneurysm measuring up to 3.5cm.    She states her mom died of a "heart aneurysm" but does not know what kind. She denies personal history of connective tissue disorder  Today the patient reports ***  Current Outpatient Medications on File Prior to Visit  Medication Sig Dispense Refill   acetaminophen (TYLENOL CHILDRENS) 160 MG/5ML suspension Take 20.3-31.3 mLs (650-1,000 mg total) by mouth every 6 (six) hours as needed. 118 mL 0   cetirizine (ZYRTEC) 10 MG tablet Take 10 mg by mouth daily.     MELATONIN PO Take 1 tablet by mouth at bedtime.     meloxicam (MOBIC) 15 MG tablet Take 15 mg by mouth daily as needed for pain (bursitis).     nitroGLYCERIN (NITROSTAT) 0.4 MG SL tablet Place 0.4 mg under the tongue every 5 (five) minutes as needed for chest pain.     triamterene-hydrochlorothiazide (MAXZIDE-25) 37.5-25 MG per tablet Take 1 tablet by mouth daily.      No current facility-administered medications on file prior to visit.   Vitals:  Physical Exam: General: Neuro: CV: Pulm: GI: Extremities:  CTA Results: CLINICAL DATA:  Aneurysmal disease of the thoracic aorta.   EXAM: CT ANGIOGRAPHY CHEST WITH CONTRAST   TECHNIQUE: Multidetector CT imaging of the chest was performed using  the standard protocol during bolus administration of intravenous contrast. Multiplanar CT image reconstructions and MIPs were obtained to evaluate the vascular anatomy.   RADIATION DOSE REDUCTION: This exam was performed according to the departmental dose-optimization program which includes automated exposure control, adjustment of the mA and/or kV according to patient size and/or use of iterative reconstruction technique.   CONTRAST:  75mL ISOVUE-370 IOPAMIDOL (ISOVUE-370) INJECTION 76%   COMPARISON:  Prior CT of the chest on 05/27/2023 and 03/23/2022   FINDINGS: Cardiovascular: The aortic root measures approximately 3.3-3.5 cm at the level of the sinuses of Valsalva. The ascending thoracic aorta is dilated and measures approximately 4.9 cm in greatest measured diameter which is stable since the prior chest CT, but does show increase in size from approximately 4.6 cm on the 2023 study. The proximal arch measures approximately 4.1 cm and the distal arch 3.1 cm. The proximal descending thoracic aorta measures 3.3 cm, mid descending thoracic aorta measures 3.2 cm and the distal descending thoracic aorta 3.7 cm. Proximal abdominal aorta just above the celiac origin measures 3.4 cm. No evidence of aortic dissection. Visualized proximal great vessels demonstrate normal patency and branching anatomy.   The heart size is within normal limits. No pericardial fluid identified. Calcified coronary artery plaque present. Central pulmonary arteries are of normal caliber.   Mediastinum/Nodes: No enlarged mediastinal, hilar, or axillary lymph nodes. Thyroid gland, trachea, and esophagus demonstrate no significant findings.  Lungs/Pleura: Stable advanced emphysematous lung disease. Stable scattered small pulmonary nodules, primarily in the left upper lobe with the largest measuring 3 mm just anterior to the major fissure on image 43/6. There is no evidence of pulmonary edema, consolidation,  pneumothorax or pleural fluid.   Upper Abdomen: No acute abnormality.   Musculoskeletal: No chest wall abnormality. No acute or significant osseous findings.   Review of the MIP images confirms the above findings.   IMPRESSION: 1. Stable 4.9 cm aneurysmal dilatation of the ascending thoracic aorta. This is stable since the prior chest CT, but does show increase in size from approximately 4.6 cm on the 2023 study. There also is aneurysmal disease involving the aortic arch and diffuse dilatation of the entire descending thoracic aorta extending into the proximal abdominal aorta. 2. Ascending thoracic aortic aneurysm. Recommend semi-annual imaging followup by CTA or MRA and referral to cardiothoracic surgery if not already obtained. This recommendation follows 2010 ACCF/AHA/AATS/ACR/ASA/SCA/SCAI/SIR/STS/SVM Guidelines for the Diagnosis and Management of Patients With Thoracic Aortic Disease. Circulation. 2010; 121: U981-X914. Aortic aneurysm NOS (ICD10-I71.9) 3. Stable advanced emphysematous lung disease. 4. Stable scattered small pulmonary nodules, primarily in the left upper lobe with the largest measuring 3 mm. 5. Calcified coronary artery plaque.     Electronically Signed   By: Irish Lack M.D.   On: 10/26/2023 15:59  Impression and Plan: Ascending thoracic aortic aneurysm: Ms. Wheeland presents to the clinic with a *** cm ascending aortic aneurysm.  Echocardiogram shows a *** valve without evidence of regurgitation.  We discussed the natural history and and risk factors for growth of ascending aortic aneurysms.  We covered the importance of smoking cessation, tight blood pressure control, refraining from lifting heavy objects, and avoiding fluoroquinolones.  The patient is aware of signs and symptoms of aortic dissection and when to present to the emergency department.  There is currently no indication for surgery since her ascending aortic aneurysm does not measure 5.5cm or  more. We will continue surveillance and a repeat ** was ordered for ***.  Infrarenal aortic aneurysm: Will follow up with vascular surgery for surveillance scans every 2 years.    Risk Modification:  Statin:  ***  Smoking cessation instruction/counseling given:  {CHL AMB [facility] SMOKING CESSATION COUNSELING:20758}  Patient was counseled on importance of Blood Pressure Control.  Despite Medical intervention if the patient notices persistently elevated blood pressure readings.  They are instructed to contact their Primary Care Physician  Please avoid use of Fluoroquinolones as this can potentially increase your risk of Aortic Rupture and/or Dissection  Patient educated on signs and symptoms of Aortic Dissection, handout also provided in AVS  Jenny Reichmann, PA-C 10/19/23

## 2023-10-22 ENCOUNTER — Encounter: Payer: Self-pay | Admitting: Surgery

## 2023-10-26 ENCOUNTER — Ambulatory Visit
Admission: RE | Admit: 2023-10-26 | Discharge: 2023-10-26 | Disposition: A | Discharge status: Home / Self Care | Source: Ambulatory Visit | Attending: Surgery | Admitting: Surgery

## 2023-10-26 DIAGNOSIS — I7121 Aneurysm of the ascending aorta, without rupture: Secondary | ICD-10-CM

## 2023-10-26 DIAGNOSIS — N3020 Other chronic cystitis without hematuria: Secondary | ICD-10-CM | POA: Diagnosis not present

## 2023-10-26 MED ORDER — IOPAMIDOL (ISOVUE-370) INJECTION 76%
500.0000 mL | Freq: Once | INTRAVENOUS | Status: AC | PRN
  Administered 2023-10-26: 75 mL via INTRAVENOUS

## 2023-10-28 DIAGNOSIS — L570 Actinic keratosis: Secondary | ICD-10-CM | POA: Diagnosis not present

## 2023-10-28 DIAGNOSIS — Z08 Encounter for follow-up examination after completed treatment for malignant neoplasm: Secondary | ICD-10-CM | POA: Diagnosis not present

## 2023-10-28 DIAGNOSIS — Z85828 Personal history of other malignant neoplasm of skin: Secondary | ICD-10-CM | POA: Diagnosis not present

## 2023-10-28 DIAGNOSIS — X32XXXD Exposure to sunlight, subsequent encounter: Secondary | ICD-10-CM | POA: Diagnosis not present

## 2023-11-01 ENCOUNTER — Ambulatory Visit (HOSPITAL_COMMUNITY): Attending: Cardiology

## 2023-11-01 DIAGNOSIS — I7121 Aneurysm of the ascending aorta, without rupture: Secondary | ICD-10-CM | POA: Diagnosis not present

## 2023-11-01 LAB — ECHOCARDIOGRAM COMPLETE
Area-P 1/2: 3.54 cm2
P 1/2 time: 5786 ms
S' Lateral: 2.2 cm

## 2023-11-01 NOTE — Patient Instructions (Signed)

## 2023-11-02 ENCOUNTER — Ambulatory Visit: Admitting: Physician Assistant

## 2023-11-02 ENCOUNTER — Encounter: Payer: Self-pay | Admitting: Physician Assistant

## 2023-11-02 VITALS — BP 110/71 | HR 80 | Resp 18 | Ht 63.0 in | Wt 127.0 lb

## 2023-11-02 DIAGNOSIS — I7121 Aneurysm of the ascending aorta, without rupture: Secondary | ICD-10-CM

## 2023-12-07 DIAGNOSIS — M1712 Unilateral primary osteoarthritis, left knee: Secondary | ICD-10-CM | POA: Diagnosis not present

## 2023-12-07 DIAGNOSIS — R262 Difficulty in walking, not elsewhere classified: Secondary | ICD-10-CM | POA: Diagnosis not present

## 2023-12-07 DIAGNOSIS — M25562 Pain in left knee: Secondary | ICD-10-CM | POA: Diagnosis not present

## 2023-12-07 DIAGNOSIS — M25662 Stiffness of left knee, not elsewhere classified: Secondary | ICD-10-CM | POA: Diagnosis not present

## 2023-12-15 DIAGNOSIS — M25662 Stiffness of left knee, not elsewhere classified: Secondary | ICD-10-CM | POA: Diagnosis not present

## 2023-12-15 DIAGNOSIS — M25562 Pain in left knee: Secondary | ICD-10-CM | POA: Diagnosis not present

## 2023-12-15 DIAGNOSIS — M1712 Unilateral primary osteoarthritis, left knee: Secondary | ICD-10-CM | POA: Diagnosis not present

## 2023-12-15 DIAGNOSIS — R262 Difficulty in walking, not elsewhere classified: Secondary | ICD-10-CM | POA: Diagnosis not present

## 2023-12-29 ENCOUNTER — Other Ambulatory Visit: Payer: Self-pay | Admitting: Physician Assistant

## 2023-12-29 DIAGNOSIS — Z1331 Encounter for screening for depression: Secondary | ICD-10-CM | POA: Diagnosis not present

## 2023-12-29 DIAGNOSIS — M353 Polymyalgia rheumatica: Secondary | ICD-10-CM | POA: Diagnosis not present

## 2023-12-29 DIAGNOSIS — M4696 Unspecified inflammatory spondylopathy, lumbar region: Secondary | ICD-10-CM

## 2023-12-29 DIAGNOSIS — Z0000 Encounter for general adult medical examination without abnormal findings: Secondary | ICD-10-CM | POA: Diagnosis not present

## 2024-01-25 ENCOUNTER — Ambulatory Visit
Admission: RE | Admit: 2024-01-25 | Discharge: 2024-01-25 | Disposition: A | Discharge status: Home / Self Care | Source: Ambulatory Visit | Attending: Physician Assistant

## 2024-01-25 DIAGNOSIS — M4696 Unspecified inflammatory spondylopathy, lumbar region: Secondary | ICD-10-CM

## 2024-01-25 DIAGNOSIS — M5126 Other intervertebral disc displacement, lumbar region: Secondary | ICD-10-CM | POA: Diagnosis not present

## 2024-01-25 DIAGNOSIS — M48061 Spinal stenosis, lumbar region without neurogenic claudication: Secondary | ICD-10-CM | POA: Diagnosis not present

## 2024-01-25 DIAGNOSIS — M4726 Other spondylosis with radiculopathy, lumbar region: Secondary | ICD-10-CM | POA: Diagnosis not present

## 2024-01-31 DIAGNOSIS — J432 Centrilobular emphysema: Secondary | ICD-10-CM | POA: Diagnosis not present

## 2024-01-31 DIAGNOSIS — E7800 Pure hypercholesterolemia, unspecified: Secondary | ICD-10-CM | POA: Diagnosis not present

## 2024-01-31 DIAGNOSIS — M4696 Unspecified inflammatory spondylopathy, lumbar region: Secondary | ICD-10-CM | POA: Diagnosis not present

## 2024-01-31 DIAGNOSIS — I2089 Other forms of angina pectoris: Secondary | ICD-10-CM | POA: Diagnosis not present

## 2024-02-15 DIAGNOSIS — E7800 Pure hypercholesterolemia, unspecified: Secondary | ICD-10-CM | POA: Diagnosis not present

## 2024-02-22 DIAGNOSIS — M5136 Other intervertebral disc degeneration, lumbar region with discogenic back pain only: Secondary | ICD-10-CM | POA: Diagnosis not present

## 2024-02-22 DIAGNOSIS — E785 Hyperlipidemia, unspecified: Secondary | ICD-10-CM | POA: Diagnosis not present

## 2024-02-22 DIAGNOSIS — M7061 Trochanteric bursitis, right hip: Secondary | ICD-10-CM | POA: Diagnosis not present

## 2024-02-22 DIAGNOSIS — M7062 Trochanteric bursitis, left hip: Secondary | ICD-10-CM | POA: Diagnosis not present

## 2024-02-29 ENCOUNTER — Ambulatory Visit: Attending: Internal Medicine | Admitting: Cardiology

## 2024-02-29 ENCOUNTER — Encounter: Payer: Self-pay | Admitting: *Deleted

## 2024-02-29 ENCOUNTER — Other Ambulatory Visit (HOSPITAL_COMMUNITY): Payer: Self-pay

## 2024-02-29 ENCOUNTER — Other Ambulatory Visit: Payer: Self-pay | Admitting: *Deleted

## 2024-02-29 ENCOUNTER — Encounter: Payer: Self-pay | Admitting: Cardiology

## 2024-02-29 VITALS — BP 108/72 | Resp 16 | Ht 63.0 in | Wt 134.6 lb

## 2024-02-29 DIAGNOSIS — E7800 Pure hypercholesterolemia, unspecified: Secondary | ICD-10-CM

## 2024-02-29 DIAGNOSIS — R0609 Other forms of dyspnea: Secondary | ICD-10-CM | POA: Diagnosis not present

## 2024-02-29 DIAGNOSIS — I351 Nonrheumatic aortic (valve) insufficiency: Secondary | ICD-10-CM

## 2024-02-29 DIAGNOSIS — I10 Essential (primary) hypertension: Secondary | ICD-10-CM

## 2024-02-29 DIAGNOSIS — I7143 Infrarenal abdominal aortic aneurysm, without rupture: Secondary | ICD-10-CM

## 2024-02-29 DIAGNOSIS — I7121 Aneurysm of the ascending aorta, without rupture: Secondary | ICD-10-CM

## 2024-02-29 MED ORDER — SIMVASTATIN 20 MG PO TABS
20.0000 mg | ORAL_TABLET | Freq: Every day | ORAL | 2 refills | Status: DC
  Filled 2024-02-29: qty 30, 30d supply, fill #0
  Filled 2024-03-26: qty 30, 30d supply, fill #1

## 2024-02-29 MED ORDER — LOSARTAN POTASSIUM 50 MG PO TABS
50.0000 mg | ORAL_TABLET | Freq: Every day | ORAL | 3 refills | Status: AC
  Filled 2024-02-29: qty 90, 90d supply, fill #0
  Filled 2024-05-24: qty 90, 90d supply, fill #1

## 2024-02-29 NOTE — Patient Instructions (Addendum)
 Medication Instructions:  Your physician has recommended you make the following change in your medication:  Start Losartan  50 mg by mouth daily Start Simvastatin  20 mg by mouth daily  *If you need a refill on your cardiac medications before your next appointment, please call your pharmacy*  Lab Work:  Have lab work (BMP) done in 2-3 weeks.  This is not fasting.   Have lipid profile checked a week or so prior to next appointment with Dr Ladona Lab work can be done at any [facility] location If you have labs (blood work) drawn today and your tests are completely normal, you will receive your results only by: [employer] (if you have MyChart) OR A paper copy in the mail If you have any lab test that is abnormal or we need to change your treatment, we will call you to review the results.  Testing/Procedures: Your physician has requested that you have a lexiscan myoview. For further information please visit [email or web address] Please follow instruction sheet, as given.   Follow-Up: At [facility], you and your health needs are our priority.  As part of our continuing mission to provide you with exceptional heart care, our providers are all part of one team.  This team includes your primary Cardiologist (physician) and Advanced Practice Providers or APPs (Physician Assistants and Nurse Practitioners) who all work together to provide you with the care you need, when you need it.  Your next appointment:   2 -  2 1/2  month(s)  Provider:   Gordy Ladona, MD    We recommend signing up for the patient portal called MyChart.  Sign up information is provided on this After Visit Summary.  MyChart is used to connect with patients for Virtual Visits (Telemedicine).  Patients are able to view lab/test results, encounter notes, upcoming appointments, etc.  Non-urgent messages can be sent to your provider as well.   To learn more about what you can do with MyChart, go to [email or web address].    Other Instructions    You are scheduled for a Myocardial Perfusion Imaging Study on:   at .  Please arrive 15 minutes prior to your appointment time for registration and insurance purposes.  The test will take approximately 3 to 4 hours to complete; you may bring reading material.  If someone comes with you to your appointment, they will need to remain in the main lobby due to limited space in the testing area. **If you are pregnant or breastfeeding, please notify the nuclear lab prior to your appointment**  How to prepare for your Myocardial Perfusion Test: Do not eat or drink 3 hours prior to your test, except you may have water. Do not consume products containing caffeine (regular or decaffeinated) 12 hours prior to your test. (ex: coffee, chocolate, sodas, tea). Do bring a list of your current medications with you.  If not listed below, you may take your medications as normal. Do wear comfortable clothes (no dresses or overalls) and walking shoes, tennis shoes preferred (No heels or open toe shoes are allowed). Do NOT wear cologne, perfume, aftershave, or lotions (deodorant is allowed). If these instructions are not followed, your test will have to be rescheduled.  Please report to [address] ([facility]), 2nd Floor, for your test.  If you have questions or concerns about your appointment, you can call the Nuclear Lab at [phone].  If you cannot keep your appointment, please provide 24 hours notification  to the Nuclear Lab, to avoid a possible $50 charge to your account.            You may go to any of these [facility] locations:   [facility] - [address] ([facility]) - [address] [id number] [address] (1st floor, next to pharmacy)   [place] - [address]    [place]  - [address]     [place] - [address] - [address] [employer]  - [address] ([facility])  [place]   - [address], [address]

## 2024-02-29 NOTE — Progress Notes (Signed)
 Cardiology Office Note:  .   Date:  02/29/2024  ID:  Julie Day, DOB [birth year], MRN [id number] PCP: Signa Rush, MD (Inactive)  [facility] Providers Cardiologist:  Gordy Bergamo, MD   History of Present Illness: .   Julie Day is a 78 y.o. Caucasian female patient with moderate-sized ascending aortic aneurysm and small infrarenal abdominal aortic aneurysm by last CT scan done in March 2025 and stable since 05/27/2023.  Past medical history significant for hypertension.  Her main complaint today is marked dyspnea on exertion during routine activities requiring frequent resting.  Discussed the use of AI scribe software for clinical note transcription with the patient, who gave verbal consent to proceed.  History of Present Illness Julie Day is a 78 year old female with a thoracic aortic aneurysm who presents for cardiovascular evaluation. She is accompanied by her daughter, Jeoffrey, and her husband, Bebe. She was referred by Dr. Redell Fellers for cardiovascular evaluation related to her aneurysm.  She has a thoracic aortic aneurysm identified in 2023 with no significant changes in symptoms. She denies chest pain or severe back pain. Her mother had a similar condition. She has hypertension and experiences occasional nighttime pain, which she sometimes worries might be cardiac-related. She smokes over half a pack of cigarettes and has attempted to quit. She experiences shortness of breath when walking uphill or on sand dunes, attributing it to exertion and knee pain. Her physical activity includes walking from the car to the beach and performing housework, avoiding vacuuming due to back and knee issues.  Labs   External Labs:  Care everywhere labs 12/29/2023:  Hb 13.1/HCT 38.5, platelets 297, normal indicis.  Serum glucose 76 mg, BUN 19, creatinine 0.78, EGFR 78 mL, potassium 4.2, LFTs normal.  Vitamin D 27.7.  Total cholesterol 178, triglycerides 133, HDL 51, LDL 104.  ROS   Review of Systems  Cardiovascular:  Positive for dyspnea on exertion. Negative for chest pain, claudication and leg swelling.   Physical Exam:   VS:  BP 108/72 (BP Location: Left Arm, Patient Position: Sitting, Cuff Size: Normal)   Resp 16   Ht 5' 3 (1.6 m)   Wt 134 lb 9.6 oz (61.1 kg)   BMI 23.84 kg/m    Wt Readings from Last 3 Encounters:  02/29/24 134 lb 9.6 oz (61.1 kg)  11/02/23 127 lb (57.6 kg)  08/05/23 118 lb (53.5 kg)    Physical Exam Neck:     Vascular: No carotid bruit or JVD.  Cardiovascular:     Rate and Rhythm: Normal rate and regular rhythm.     Pulses: Intact distal pulses.     Heart sounds: Murmur heard.     Harsh early systolic murmur of grade 2/6 is also present at the upper right sternal border.     Blowing decrescendo early diastolic murmur is present with a grade of 2/4 at the upper right sternal border.     No gallop.  Pulmonary:     Effort: Pulmonary effort is normal.     Breath sounds: Normal breath sounds.  Abdominal:     General: Bowel sounds are normal.     Palpations: Abdomen is soft.  Musculoskeletal:     Right lower leg: No edema.     Left lower leg: No edema.    Studies Reviewed: .    CT angiogram chest and abdomen 10/26/2023: Comparison 05/27/2023 and 03/23/2022. 1. Stable 4.9 cm aneurysmal dilatation of the ascending thoracic aorta. This is stable since the  prior chest CT, but does show increase in size from approximately 4.6 cm on the 2023 study. There also is aneurysmal disease involving the aortic arch and diffuse dilatation of the entire descending thoracic aorta extending into the proximal abdominal aorta. 2. Ascending thoracic aortic aneurysm. Recommend semi-annual imaging followup by CTA or MRA and referral to cardiothoracic surgery 3. Stable advanced emphysematous lung disease. 4. Stable scattered small pulmonary nodules, primarily in the left upper lobe with the largest measuring 3 mm. 5. Calcified coronary artery  plaque.  EKG:    EKG Interpretation Date/Time:  Tuesday February 29 2024 11:45:22 EDT Ventricular Rate:  69 PR Interval:  186 QRS Duration:  88 QT Interval:  428 QTC Calculation: 458 R Axis:   -16  Text Interpretation: Normal sinus rhythm Normal ECG When compared with ECG of 01-Sep-2021 11:13, No significant change was found Confirmed by Brodey Bonn, Jagadeesh [phone]) on 02/29/2024 11:58:57 AM    Medications ordered    Meds ordered this encounter  Medications   losartan  (COZAAR ) 50 MG tablet    Sig: Take 1 tablet (50 mg total) by mouth daily.    Dispense:  90 tablet    Refill:  3   simvastatin  (ZOCOR ) 20 MG tablet    Sig: Take 1 tablet (20 mg total) by mouth at bedtime.    Dispense:  30 tablet    Refill:  2     ASSESSMENT AND PLAN: .      ICD-10-CM   1. Dyspnea on exertion  R06.09 Basic Metabolic Panel (BMET)    MYOCARDIAL PERFUSION IMAGING    Cardiac Stress Test: Informed Consent Details: Physician/Practitioner Attestation; Transcribe to consent form and obtain patient signature    2. Moderate aortic regurgitation  I35.1 EKG 12-Lead    Basic Metabolic Panel (BMET)    MYOCARDIAL PERFUSION IMAGING    3. Aneurysm of ascending aorta without rupture (HCC)  I71.21 losartan  (COZAAR ) 50 MG tablet    Basic Metabolic Panel (BMET)    MYOCARDIAL PERFUSION IMAGING    4. Infrarenal abdominal aortic aneurysm (AAA) without rupture (HCC)  I71.43     5. Primary hypertension  I10 losartan  (COZAAR ) 50 MG tablet    Basic Metabolic Panel (BMET)    MYOCARDIAL PERFUSION IMAGING    6. Hypercholesteremia  E78.00 simvastatin  (ZOCOR ) 20 MG tablet    Lipid Profile     Assessment & Plan  Dyspnea on exertion Patient with significant cardiovascular risk factors, also has thoracic aneurysm, due to arthritis her activity is limited in spite of this she feels markedly dyspneic doing activities at home and also walking slight incline. -Scheduled for Lexiscan nuclear stress test.  Moderate aortic  regurgitation Not of clinical concern for now.  Will continue to monitor this clinically for now.  Thoracic aortic aneurysm, stable, 4.6 cm The thoracic aortic aneurysm measures 4.6 cm, a slight increase from 4.5 cm in 2023. Surgical repair is considered when the aneurysm approaches >5.5 cm. It is likely related to smoking and possibly hereditary, as her mother had a similar condition. Currently classified as moderate. - Continue annual CT scans to monitor the aneurysm which patient wants me to take over for surveillance. - Educate on recognizing symptoms of aneurysm rupture, such as severe shearing pain in the back and chest. - Encourage smoking cessation to prevent further vascular damage.  Abdominal aortic aneurysm Very small infrarenal abdominal attic aneurysm was noted by CT scan.  Losartan  will also help with this. - Rescan for AAA abdomen in 3 years.  Hypertension Hypertension is managed with triamterene hydrochlorothiazide, but a switch to losartan  is recommended for its protective effects on the aneurysm and kidneys. - Discontinue triamterene hydrochlorothiazide. - Initiate losartan  50 mg once daily, with a goal to increase to 100 mg if tolerated. - Monitor blood pressure and kidney function with blood tests in 2-3 weeks.  Nicotine  dependence (tobacco use disorder) Nicotine  dependence is a significant risk factor for vascular damage and aneurysm development. - Strongly encourage smoking cessation and discuss the benefits of quitting, including improved vascular health and potential longevity.  Hyperlipidemia Hyperlipidemia is present with an LDL of 104, warranting treatment due to vascular disease, smoking history, and hypertension. Previous statin (Crestor 10 mg) therapy was discontinued due to joint pain. - Initiate simvastatin  20 mg daily in the evening. - Consider CoQ10 supplementation if muscle pain occurs. - Monitor for side effects and adjust treatment as  necessary.  Vitamin D deficiency Vitamin D deficiency was noted with a level of 27. - Continue current vitamin D supplementation regimen. - Vitamin D supplement was not listed on her medication list but patient is sure she is taking vitamin D supplementation what appears to be 50,000 units weekly.  I will see her back in 3 months for follow-up of hypertension, hypercholesterolemia and smoking cessation.   Signed,  Gordy Bergamo, MD, [facility] 02/29/2024, 6:06 PM [facility] [address] [place], [place] [address] Phone: [phone]. Fax:  [phone]

## 2024-03-01 ENCOUNTER — Telehealth (HOSPITAL_COMMUNITY): Payer: Self-pay | Admitting: *Deleted

## 2024-03-01 ENCOUNTER — Encounter (HOSPITAL_COMMUNITY): Payer: Self-pay | Admitting: *Deleted

## 2024-03-01 NOTE — Telephone Encounter (Signed)
 Letter with instructions sent for upcoming stress test.  Julie Day

## 2024-03-02 DIAGNOSIS — I2089 Other forms of angina pectoris: Secondary | ICD-10-CM | POA: Diagnosis not present

## 2024-03-02 DIAGNOSIS — J432 Centrilobular emphysema: Secondary | ICD-10-CM | POA: Diagnosis not present

## 2024-03-02 DIAGNOSIS — E7800 Pure hypercholesterolemia, unspecified: Secondary | ICD-10-CM | POA: Diagnosis not present

## 2024-03-02 DIAGNOSIS — M4696 Unspecified inflammatory spondylopathy, lumbar region: Secondary | ICD-10-CM | POA: Diagnosis not present

## 2024-03-06 ENCOUNTER — Other Ambulatory Visit: Payer: Self-pay

## 2024-03-06 ENCOUNTER — Other Ambulatory Visit: Payer: Self-pay | Admitting: Cardiology

## 2024-03-06 ENCOUNTER — Ambulatory Visit: Attending: Internal Medicine | Admitting: Physical Therapy

## 2024-03-06 DIAGNOSIS — M5459 Other low back pain: Secondary | ICD-10-CM | POA: Insufficient documentation

## 2024-03-06 DIAGNOSIS — M62830 Muscle spasm of back: Secondary | ICD-10-CM | POA: Insufficient documentation

## 2024-03-06 DIAGNOSIS — I7121 Aneurysm of the ascending aorta, without rupture: Secondary | ICD-10-CM

## 2024-03-06 DIAGNOSIS — R0609 Other forms of dyspnea: Secondary | ICD-10-CM

## 2024-03-06 DIAGNOSIS — I10 Essential (primary) hypertension: Secondary | ICD-10-CM

## 2024-03-06 DIAGNOSIS — I351 Nonrheumatic aortic (valve) insufficiency: Secondary | ICD-10-CM

## 2024-03-06 NOTE — Therapy (Signed)
 OUTPATIENT PHYSICAL THERAPY THORACOLUMBAR EVALUATION   Patient Name: Julie Day MRN: [id number] DOB:[birth year], 78 y.o., female Today's Date: 03/06/2024  END OF SESSION:  PT End of Session - 03/06/24 1127     Visit Number 1    Number of Visits 12    Date for PT Re-Evaluation 04/17/24    PT Start Time 1100    PT Stop Time 1153    PT Time Calculation (min) 53 min    Activity Tolerance Patient tolerated treatment well    Behavior During Therapy [facility] for tasks assessed/performed          Past Medical History:  Diagnosis Date   09/08/2021    Allergic rhinitis    Aneurysm of thoracic aorta (HCC)    Anginal pain (HCC)    Dx as GERD   Atypical mole 09/23/2017   Left Inner Thigh-Mild   Atypical mole 09/23/2017   Right Axilla-Mild   Atypical mole 09/05/2015   Center Abdomen-Severe (clear) (Dr. Shona)   Atypical mole 04/14/2019   Right Thigh-Solar Lentigo   Chronic cystitis    Degenerative disc disease    Esophageal fistula    GERD (gastroesophageal reflux disease)    Hemangioma of liver    History of hiatal hernia 2017   Hypertension    SCC (squamous cell carcinoma) 09/05/2015   Left Dorsal Hand-Well Diff Keratoacanthoma (Dr. Shona) (free)   SCC (squamous cell carcinoma) 04/14/2019   Left Outer Shin-Keratoacanthoma (treatment after biopsy)   SCC (squamous cell carcinoma) 07/21/2019   Right Thigh-Keratoacanthoma (treatment Fluorouracil Cream)   Severe protein-calorie malnutrition (HCC)    Squamous cell carcinoma of skin 09/23/2017   Right Upper Shin-Well Diff (Cx3,5FU)   Past Surgical History:  Procedure Laterality Date   ABDOMINAL HYSTERECTOMY     CHOLECYSTECTOMY     COLON RESECTION N/A 05/31/2015   Procedure: LAPAROSCOPIC REPAIR OF HIATAL HERNIA  AND LAPAROSCOPIC NISSEN FUNDOPLICATION;  Surgeon: Elon Pacini, MD;  Location: [facility] OR;  Service: General;  Laterality: N/A;   ESOPHAGEAL MANOMETRY N/A 02/27/2015   Procedure: ESOPHAGEAL MANOMETRY (EM);  Surgeon: Gladis MARLA Louder, MD;  Location: [facility] ENDOSCOPY;  Service: Endoscopy;  Laterality: N/A;   ESOPHAGOGASTRODUODENOSCOPY N/A 03/25/2022   Procedure: ESOPHAGOGASTRODUODENOSCOPY (EGD);  Surgeon: Dianna Specking, MD;  Location: [facility] ENDOSCOPY;  Service: Gastroenterology;  Laterality: N/A;   ESOPHAGOGASTRODUODENOSCOPY (EGD) WITH PROPOFOL  N/A 02/26/2015   Procedure: ESOPHAGOGASTRODUODENOSCOPY (EGD) WITH PROPOFOL ;  Surgeon: Gladis MARLA Louder, MD;  Location: [facility] ENDOSCOPY;  Service: Endoscopy;  Laterality: N/A;   HIATAL HERNIA REPAIR  05/31/2015   HIATAL HERNIA REPAIR N/A 09/08/2021   Procedure: LAPAROSCOPIC REVISION HIATAL HERNIA REPAIR WITH FUNDOPLICATION AND MESH;  Surgeon: Kinsinger, Herlene Righter, MD;  Location: [facility] ORS;  Service: General;  Laterality: N/A;   IR GASTROSTOMY TUBE MOD SED  03/27/2022   IR PATIENT EVAL TECH 0-60 MINS  04/07/2022   IR RADIOLOGIST EVAL & MGMT  04/07/2022   LAPAROSCOPIC INSERTION GASTROSTOMY TUBE N/A 09/08/2021   Procedure: LAPAROSCOPIC INSERTION GASTROSTOMY TUBE;  Surgeon: Stevie Herlene Righter, MD;  Location: [facility] ORS;  Service: General;  Laterality: N/A;  18FR   TOTAL KNEE ARTHROPLASTY Right 10/03/2019   Procedure: RIGHT TOTAL KNEE ARTHROPLASTY;  Surgeon: Sheril Coy, MD;  Location: [facility] ORS;  Service: Orthopedics;  Laterality: Right;   WISDOM TOOTH EXTRACTION     Patient Active Problem List   Diagnosis Date Noted   Esophageal fistula 04/11/2022   Abnormal barium swallow 04/11/2022   Esophageal dysphagia 04/11/2022   Cellulitis of abdominal wall 04/11/2022  Abdominal pain 04/11/2022   Unintentional weight loss 04/11/2022   Protein-calorie malnutrition, severe 03/26/2022   Esophageal perforation 03/24/2022   Hiatal hernia 09/08/2021   Primary localized osteoarthritis of right knee 10/03/2019   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    Rationale for Evaluation and  Treatment: Rehabilitation  THERAPY DIAG:  Other low back pain  Muscle spasm of back  ONSET DATE: 2 years+  SUBJECTIVE:                                                                                                                                                                                           SUBJECTIVE STATEMENT: The patient presents to the clinic with chronic low back pain.  She states her pain prohibits her from sleeping.  She rates her pain at a 9-10/10.  Ice helps decrease her pain.  Standing and housework increase her pain.    PERTINENT HISTORY:  H/o hip bursitis.    PAIN:  Are you having pain? Yes: NPRS scale: 9-10/10.  Pain location: LB/Hips Pain description: Ache, sharp and shooting.   Aggravating factors: As above. Relieving factors: As above.    PRECAUTIONS: None  RED FLAGS: None   WEIGHT BEARING RESTRICTIONS: No  FALLS:  Has patient fallen in last 6 months? No  LIVING ENVIRONMENT: Lives in: House/apartment Has following equipment at home: None  OCCUPATION: Retired.  PLOF: Independent  PATIENT GOALS: Sleep better and do more with less pain.      OBJECTIVE:  Note: Objective measures were completed at Evaluation unless otherwise noted.  DIAGNOSTIC FINDINGS:  01/25/24:  IMPRESSION: 1. No acute findings or clear explanation for the patient's symptoms. 2. Multilevel spondylosis with disc bulging, endplate osteophytes and facet hypertrophy as described. There is mild spinal stenosis at L3-4 with mild right-greater-than-left foraminal and right lateral recess narrowing. 3. Mild right lateral recess and biforaminal narrowing at L4-5. 4. Mild to moderate left foraminal narrowing at L5-S1. 5. Grossly stable fusiform dilatation of the distal thoracic and proximal abdominal aorta compared with prior CT's.  PATIENT SURVEYS:  ODI: 24/50.  PALPATION: Tender to palpation over bilateral lower lumbar region and  SIJ's, right > left and bilateral  hip musculature.  LUMBAR ROM:   Full active lumbar flexion and extension to 15 degrees.    LOWER EXTREMITY ROM:     WNL.  LOWER EXTREMITY MMT:    Normal bilateral LE strength.  LUMBAR SPECIAL TESTS:  Equal leg lengths.  (-) SLR testing.  Positive right FABER test.    GAIT: Slow  and purposeful.  TREATMENT DATE: 03/06/24:  IFC at 80-150 Hz on 40% scan to patient's bilateral lower lumbar region and SIJ's f/b STW/M x 8 minutes (patient in left sdly position with pillows between knees for comfort) to patient's   right lower lumbar, SIJ and lateral hip musculature.    Normal modality response following removal of modality.                                                                                                                     PATIENT EDUCATION:  Education details: See below. Person educated: Patient Education method: Explanation, Demonstration, Tactile cues, and Handouts Education comprehension: verbalized understanding and returned demonstration  HOME EXERCISE PROGRAM: SKTC  [[id number]]  SINGLE KNEE TO CHEST STRETCH - SKTC -  Repeat 3 Repetitions, Hold 1 Minute, Complete 1 Set, Perform 3 Times a Day  DOUBLE KNEE TO CHEST STRETCH - DKTC -  Repeat 2 Repetitions, Hold 1 Minute, Complete 1 Set, Perform 3 Times a Day   ASSESSMENT:  CLINICAL IMPRESSION: The patient presents to OPPT with c/o chronic low back pain. Her pain prohibits her from sleeping well.  She is tender to palpation over bilateral lower lumbar region and  SIJ's, right > left and bilateral hip musculature.  She demonstrates a positive FABER test on the right.  She exhibits normal LE strength.  Her ODI score is 26/50.    Patient will benefit from skilled PT intervention to address pain and deficits.   OBJECTIVE IMPAIRMENTS: decreased activity tolerance, decreased ROM, increased muscle spasms, and pain.   ACTIVITY LIMITATIONS: carrying, lifting, bending, standing, and sleeping  PARTICIPATION LIMITATIONS: meal  prep, cleaning, laundry, and yard work  PERSONAL FACTORS: Time since onset of injury/illness/exacerbation and 1 comorbidity: h/o hip bursitis are also affecting patient's functional outcome.   REHAB POTENTIAL: Good  CLINICAL DECISION MAKING: Evolving/moderate complexity  EVALUATION COMPLEXITY: Low   GOALS:  SHORT TERM GOALS: Target date: 03/20/24  Ind with a HEP. Goal status: INITIAL   LONG TERM GOALS: Target date: 04/17/24  Sleep 6 hours undisturbed.  Goal status: INITIAL  2.  Perform ADL's with pain not > 3-4/10.  Goal status: INITIAL  3.  Improve ODI by at least 5 points.  Goal status: INITIAL  PLAN:  PT FREQUENCY: 2x/week  PT DURATION: 6 weeks  PLANNED INTERVENTIONS: 97110-Therapeutic exercises, 97530- Therapeutic activity, [id number]- Neuromuscular re-education, 97535- Self Care, [id number]- Manual therapy, G0283- Electrical stimulation (unattended), 97035- Ultrasound, Patient/Family education, and Moist heat.  PLAN FOR NEXT SESSION: S and DKTC, hip bridges, core exercise progression.  Spinal protection technique techniques.  Body mechanics training.  Modalities and STW/M as needed.     Traci Gafford, [place], PT 03/06/2024, 12:22 PM

## 2024-03-09 ENCOUNTER — Ambulatory Visit (HOSPITAL_COMMUNITY)
Admission: RE | Admit: 2024-03-09 | Discharge: 2024-03-09 | Disposition: A | Discharge status: Home / Self Care | Source: Ambulatory Visit | Attending: Cardiology | Admitting: Cardiology

## 2024-03-09 DIAGNOSIS — I7121 Aneurysm of the ascending aorta, without rupture: Secondary | ICD-10-CM | POA: Insufficient documentation

## 2024-03-09 DIAGNOSIS — I10 Essential (primary) hypertension: Secondary | ICD-10-CM | POA: Insufficient documentation

## 2024-03-09 DIAGNOSIS — R0609 Other forms of dyspnea: Secondary | ICD-10-CM | POA: Diagnosis not present

## 2024-03-09 DIAGNOSIS — I351 Nonrheumatic aortic (valve) insufficiency: Secondary | ICD-10-CM | POA: Diagnosis not present

## 2024-03-09 LAB — MYOCARDIAL PERFUSION IMAGING
LV dias vol: 92 mL (ref 46–106)
LV sys vol: 33 mL (ref 3.8–5.2)
Nuc Stress EF: 64 %
Peak HR: 93 {beats}/min
Rest HR: 53 {beats}/min
Rest Nuclear Isotope Dose: 9.8 mCi
SDS: 0
SRS: 0
SSS: 0
ST Depression (mm): 0 mm
Stress Nuclear Isotope Dose: 32.8 mCi
TID: 1.08

## 2024-03-09 MED ORDER — TECHNETIUM TC 99M TETROFOSMIN IV KIT
9.8000 | PACK | Freq: Once | INTRAVENOUS | Status: AC | PRN
Start: 2024-03-09 — End: 2024-03-09
  Administered 2024-03-09: 9.8 via INTRAVENOUS

## 2024-03-09 MED ORDER — REGADENOSON 0.4 MG/5ML IV SOLN
INTRAVENOUS | Status: AC
  Filled 2024-03-09: qty 5

## 2024-03-09 MED ORDER — AMINOPHYLLINE 25 MG/ML IV SOLN
75.0000 mg | Freq: Once | INTRAVENOUS | Status: AC
  Administered 2024-03-09: 75 mg via INTRAVENOUS

## 2024-03-09 MED ORDER — AMINOPHYLLINE 25 MG/ML IV SOLN
INTRAVENOUS | Status: AC
  Filled 2024-03-09: qty 10

## 2024-03-09 MED ORDER — TECHNETIUM TC 99M TETROFOSMIN IV KIT
32.8000 | PACK | Freq: Once | INTRAVENOUS | Status: AC | PRN
  Administered 2024-03-09: 32.8 via INTRAVENOUS

## 2024-03-09 MED ORDER — REGADENOSON 0.4 MG/5ML IV SOLN
0.4000 mg | Freq: Once | INTRAVENOUS | Status: AC
  Administered 2024-03-09: 0.4 mg via INTRAVENOUS

## 2024-03-10 ENCOUNTER — Ambulatory Visit: Payer: Self-pay | Admitting: Cardiology

## 2024-03-10 ENCOUNTER — Ambulatory Visit: Admitting: *Deleted

## 2024-03-10 ENCOUNTER — Encounter: Payer: Self-pay | Admitting: *Deleted

## 2024-03-10 DIAGNOSIS — M62830 Muscle spasm of back: Secondary | ICD-10-CM | POA: Diagnosis not present

## 2024-03-10 DIAGNOSIS — M5459 Other low back pain: Secondary | ICD-10-CM | POA: Diagnosis not present

## 2024-03-10 NOTE — Therapy (Signed)
 OUTPATIENT PHYSICAL THERAPY THORACOLUMBAR TREATMENT   Patient Name: Julie Day MRN: [id number] DOB:12-Dec-1945, 78 y.o., female Today's Date: 03/10/2024  END OF SESSION:  PT End of Session - 03/10/24 1015     Visit Number 2    Number of Visits 12    Date for PT Re-Evaluation 04/17/24    PT Start Time 1015    PT Stop Time 1105    PT Time Calculation (min) 50 min          Past Medical History:  Diagnosis Date   09/08/2021    Allergic rhinitis    Aneurysm of thoracic aorta (HCC)    Anginal pain (HCC)    Dx as GERD   Atypical mole 09/23/2017   Left Inner Thigh-Mild   Atypical mole 09/23/2017   Right Axilla-Mild   Atypical mole 09/05/2015   Center Abdomen-Severe (clear) (Dr. Shona)   Atypical mole 04/14/2019   Right Thigh-Solar Lentigo   Chronic cystitis    Degenerative disc disease    Esophageal fistula    GERD (gastroesophageal reflux disease)    Hemangioma of liver    History of hiatal hernia 2017   Hypertension    SCC (squamous cell carcinoma) 09/05/2015   Left Dorsal Hand-Well Diff Keratoacanthoma (Dr. Shona) (free)   SCC (squamous cell carcinoma) 04/14/2019   Left Outer Shin-Keratoacanthoma (treatment after biopsy)   SCC (squamous cell carcinoma) 07/21/2019   Right Thigh-Keratoacanthoma (treatment Fluorouracil Cream)   Severe protein-calorie malnutrition (HCC)    Squamous cell carcinoma of skin 09/23/2017   Right Upper Shin-Well Diff (Cx3,5FU)   Past Surgical History:  Procedure Laterality Date   ABDOMINAL HYSTERECTOMY     CHOLECYSTECTOMY     COLON RESECTION N/A 05/31/2015   Procedure: LAPAROSCOPIC REPAIR OF HIATAL HERNIA  AND LAPAROSCOPIC NISSEN FUNDOPLICATION;  Surgeon: Elon Pacini, MD;  Location: [facility];  Service: General;  Laterality: N/A;   ESOPHAGEAL MANOMETRY N/A 02/27/2015   Procedure: ESOPHAGEAL MANOMETRY (EM);  Surgeon: Gladis MARLA Louder, MD;  Location: [facility];  Service: Endoscopy;  Laterality: N/A;   ESOPHAGOGASTRODUODENOSCOPY N/A 03/25/2022    Procedure: ESOPHAGOGASTRODUODENOSCOPY (EGD);  Surgeon: Dianna Specking, MD;  Location: [facility] ENDOSCOPY;  Service: Gastroenterology;  Laterality: N/A;   ESOPHAGOGASTRODUODENOSCOPY (EGD) WITH PROPOFOL  N/A 02/26/2015   Procedure: ESOPHAGOGASTRODUODENOSCOPY (EGD) WITH PROPOFOL ;  Surgeon: Gladis MARLA Louder, MD;  Location: [facility];  Service: Endoscopy;  Laterality: N/A;   HIATAL HERNIA REPAIR  05/31/2015   HIATAL HERNIA REPAIR N/A 09/08/2021   Procedure: LAPAROSCOPIC REVISION HIATAL HERNIA REPAIR WITH FUNDOPLICATION AND MESH;  Surgeon: Kinsinger, Herlene Righter, MD;  Location: [facility];  Service: General;  Laterality: N/A;   IR GASTROSTOMY TUBE MOD SED  03/27/2022   IR PATIENT EVAL TECH 0-60 MINS  04/07/2022   IR RADIOLOGIST EVAL & MGMT  04/07/2022   LAPAROSCOPIC INSERTION GASTROSTOMY TUBE N/A 09/08/2021   Procedure: LAPAROSCOPIC INSERTION GASTROSTOMY TUBE;  Surgeon: Stevie Herlene Righter, MD;  Location: [facility];  Service: General;  Laterality: N/A;  18FR   TOTAL KNEE ARTHROPLASTY Right 10/03/2019   Procedure: RIGHT TOTAL KNEE ARTHROPLASTY;  Surgeon: Sheril Coy, MD;  Location: [facility];  Service: Orthopedics;  Laterality: Right;   WISDOM TOOTH EXTRACTION     Patient Active Problem List   Diagnosis Date Noted   Esophageal fistula 04/11/2022   Abnormal barium swallow 04/11/2022   Esophageal dysphagia 04/11/2022   Cellulitis of abdominal wall 04/11/2022   Abdominal pain 04/11/2022   Unintentional weight loss 04/11/2022   Protein-calorie malnutrition, severe 03/26/2022  Esophageal perforation 03/24/2022   Hiatal hernia 09/08/2021   Primary localized osteoarthritis of right knee 10/03/2019   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    Rationale for Evaluation and Treatment: Rehabilitation  THERAPY DIAG:  Other low back pain  Muscle spasm of back  ONSET DATE: 2 years+  SUBJECTIVE:                                                                                                                                                                                            SUBJECTIVE STATEMENT: The patient presents to the clinic with chronic low back pain, but doing better with stretches. Unable to do bridges due to pain   PERTINENT HISTORY:  H/o hip bursitis.    PAIN:  Are you having pain? Yes: NPRS scale: 6/10.  Pain location: LB/Hips Pain description: Ache, sharp and shooting.   Aggravating factors: As above. Relieving factors: As above.    PRECAUTIONS: None  RED FLAGS: None   WEIGHT BEARING RESTRICTIONS: No  FALLS:  Has patient fallen in last 6 months? No  LIVING ENVIRONMENT: Lives in: House/apartment Has following equipment at home: None  OCCUPATION: Retired.  PLOF: Independent  PATIENT GOALS: Sleep better and do more with less pain.      OBJECTIVE:  Note: Objective measures were completed at Evaluation unless otherwise noted.  DIAGNOSTIC FINDINGS:  01/25/24:  IMPRESSION: 1. No acute findings or clear explanation for the patient's symptoms. 2. Multilevel spondylosis with disc bulging, endplate osteophytes and facet hypertrophy as described. There is mild spinal stenosis at L3-4 with mild right-greater-than-left foraminal and right lateral recess narrowing. 3. Mild right lateral recess and biforaminal narrowing at L4-5. 4. Mild to moderate left foraminal narrowing at L5-S1. 5. Grossly stable fusiform dilatation of the distal thoracic and proximal abdominal aorta compared with prior CT's.  PATIENT SURVEYS:  ODI: 24/50.  PALPATION: Tender to palpation over bilateral lower lumbar region and  SIJ's, right > left and bilateral hip musculature.  LUMBAR ROM:   Full active lumbar flexion and extension to 15 degrees.    LOWER EXTREMITY ROM:     WNL.  LOWER EXTREMITY MMT:    Normal bilateral LE strength.  LUMBAR SPECIAL TESTS:  Equal leg  lengths.  (-) SLR testing.  Positive right FABER test.    GAIT: Slow and purposeful.  TREATMENT DATE: 03/10/24:  Reviewed HEP and added dying bug x6 each side  and hold for 10 secs  No bridging due to increased pain Discussed movement patterns and AD to help  with ADL's and decrease pain triggers STW/M  to RT side LB paras, SIJ, and upper glute with Pt LT side lying x 10  minutes    IFC at 80-150 Hz on 40% scan to patient's bilateral lower lumbar region and SIJ's x 15 mins    (patient in left sdly position with pillows between knees for comfort) to patient's   right lower lumbar, SIJ and lateral hip musculature.    Normal modality response following removal of modality.                                                                                                                     PATIENT EDUCATION:  Education details: See below. Person educated: Patient Education method: Explanation, Demonstration, Tactile cues, and Handouts Education comprehension: verbalized understanding and returned demonstration  HOME EXERCISE PROGRAM: SKTC  [[id number]]  SINGLE KNEE TO CHEST STRETCH - SKTC -  Repeat 3 Repetitions, Hold 1 Minute, Complete 1 Set, Perform 3 Times a Day  DOUBLE KNEE TO CHEST STRETCH - DKTC -  Repeat 2 Repetitions, Hold 1 Minute, Complete 1 Set, Perform 3 Times a Day   ASSESSMENT:  CLINICAL IMPRESSION: The patient presents to [facility] with c/o chronic low back pain. Rx focused on discussion of movement patterns to decrease pain triggers with ADL's. HEP was reviewed and then dying bug was added for core activation and stability and tolerated well. STW  also performed f/b IFC. Pt reports no increased pain end of session    OBJECTIVE IMPAIRMENTS: decreased activity tolerance, decreased ROM, increased muscle spasms, and pain.   ACTIVITY LIMITATIONS: carrying, lifting, bending, standing, and sleeping  PARTICIPATION LIMITATIONS: meal prep, cleaning, laundry, and yard work  PERSONAL  FACTORS: Time since onset of injury/illness/exacerbation and 1 comorbidity: h/o hip bursitis are also affecting patient's functional outcome.   REHAB POTENTIAL: Good  CLINICAL DECISION MAKING: Evolving/moderate complexity  EVALUATION COMPLEXITY: Low   GOALS:  SHORT TERM GOALS: Target date: 03/20/24  Ind with a HEP. Goal status: INITIAL   LONG TERM GOALS: Target date: 04/17/24  Sleep 6 hours undisturbed.  Goal status: INITIAL  2.  Perform ADL's with pain not > 3-4/10.  Goal status: INITIAL  3.  Improve ODI by at least 5 points.  Goal status: INITIAL  PLAN:  PT FREQUENCY: 2x/week  PT DURATION: 6 weeks  PLANNED INTERVENTIONS: 97110-Therapeutic exercises, 97530- Therapeutic activity, [id number]- Neuromuscular re-education, 97535- Self Care, 02859- Manual therapy, G0283- Electrical stimulation (unattended), 97035- Ultrasound, Patient/Family education, and Moist heat.  PLAN FOR NEXT SESSION: S and DKTC, hip bridges, core exercise progression.  Spinal protection technique techniques.  Body mechanics training.  Modalities and STW/M as needed.     Myrl Lazarus,CHRIS, PTA 03/10/2024, 12:04 PM

## 2024-03-10 NOTE — Progress Notes (Signed)
 She has abnormal nuclear stress and calcification in the LAD. I may need to see her sooner than 3 months but not urgent.

## 2024-03-20 ENCOUNTER — Ambulatory Visit

## 2024-03-20 DIAGNOSIS — M5459 Other low back pain: Secondary | ICD-10-CM

## 2024-03-20 DIAGNOSIS — M62830 Muscle spasm of back: Secondary | ICD-10-CM | POA: Diagnosis not present

## 2024-03-20 NOTE — Therapy (Signed)
 OUTPATIENT PHYSICAL THERAPY THORACOLUMBAR TREATMENT   Patient Name: Julie Day MRN: [id number] DOB:1946-02-09, 78 y.o., female Today's Date: 03/20/2024  END OF SESSION:  PT End of Session - 03/20/24 1018     Visit Number 3    Number of Visits 12    Date for PT Re-Evaluation 04/17/24    PT Start Time 1015    PT Stop Time 1109    PT Time Calculation (min) 54 min          Past Medical History:  Diagnosis Date   09/08/2021    Allergic rhinitis    Aneurysm of thoracic aorta (HCC)    Anginal pain (HCC)    Dx as GERD   Atypical mole 09/23/2017   Left Inner Thigh-Mild   Atypical mole 09/23/2017   Right Axilla-Mild   Atypical mole 09/05/2015   Center Abdomen-Severe (clear) (Dr. Shona)   Atypical mole 04/14/2019   Right Thigh-Solar Lentigo   Chronic cystitis    Degenerative disc disease    Esophageal fistula    GERD (gastroesophageal reflux disease)    Hemangioma of liver    History of hiatal hernia 2017   Hypertension    SCC (squamous cell carcinoma) 09/05/2015   Left Dorsal Hand-Well Diff Keratoacanthoma (Dr. Shona) (free)   SCC (squamous cell carcinoma) 04/14/2019   Left Outer Shin-Keratoacanthoma (treatment after biopsy)   SCC (squamous cell carcinoma) 07/21/2019   Right Thigh-Keratoacanthoma (treatment Fluorouracil Cream)   Severe protein-calorie malnutrition (HCC)    Squamous cell carcinoma of skin 09/23/2017   Right Upper Shin-Well Diff (Cx3,5FU)   Past Surgical History:  Procedure Laterality Date   ABDOMINAL HYSTERECTOMY     CHOLECYSTECTOMY     COLON RESECTION N/A 05/31/2015   Procedure: LAPAROSCOPIC REPAIR OF HIATAL HERNIA  AND LAPAROSCOPIC NISSEN FUNDOPLICATION;  Surgeon: Elon Pacini, MD;  Location: [facility];  Service: General;  Laterality: N/A;   ESOPHAGEAL MANOMETRY N/A 02/27/2015   Procedure: ESOPHAGEAL MANOMETRY (EM);  Surgeon: Gladis MARLA Louder, MD;  Location: [facility];  Service: Endoscopy;  Laterality: N/A;   ESOPHAGOGASTRODUODENOSCOPY N/A  03/25/2022   Procedure: ESOPHAGOGASTRODUODENOSCOPY (EGD);  Surgeon: Dianna Specking, MD;  Location: [facility] ENDOSCOPY;  Service: Gastroenterology;  Laterality: N/A;   ESOPHAGOGASTRODUODENOSCOPY (EGD) WITH PROPOFOL  N/A 02/26/2015   Procedure: ESOPHAGOGASTRODUODENOSCOPY (EGD) WITH PROPOFOL ;  Surgeon: Gladis MARLA Louder, MD;  Location: [facility];  Service: Endoscopy;  Laterality: N/A;   HIATAL HERNIA REPAIR  05/31/2015   HIATAL HERNIA REPAIR N/A 09/08/2021   Procedure: LAPAROSCOPIC REVISION HIATAL HERNIA REPAIR WITH FUNDOPLICATION AND MESH;  Surgeon: Kinsinger, Herlene Righter, MD;  Location: [facility];  Service: General;  Laterality: N/A;   IR GASTROSTOMY TUBE MOD SED  03/27/2022   IR PATIENT EVAL TECH 0-60 MINS  04/07/2022   IR RADIOLOGIST EVAL & MGMT  04/07/2022   LAPAROSCOPIC INSERTION GASTROSTOMY TUBE N/A 09/08/2021   Procedure: LAPAROSCOPIC INSERTION GASTROSTOMY TUBE;  Surgeon: Stevie Herlene Righter, MD;  Location: [facility];  Service: General;  Laterality: N/A;  18FR   TOTAL KNEE ARTHROPLASTY Right 10/03/2019   Procedure: RIGHT TOTAL KNEE ARTHROPLASTY;  Surgeon: Sheril Coy, MD;  Location: [facility];  Service: Orthopedics;  Laterality: Right;   WISDOM TOOTH EXTRACTION     Patient Active Problem List   Diagnosis Date Noted   Esophageal fistula 04/11/2022   Abnormal barium swallow 04/11/2022   Esophageal dysphagia 04/11/2022   Cellulitis of abdominal wall 04/11/2022   Abdominal pain 04/11/2022   Unintentional weight loss 04/11/2022   Protein-calorie malnutrition, severe 03/26/2022  Esophageal perforation 03/24/2022   Hiatal hernia 09/08/2021   Primary localized osteoarthritis of right knee 10/03/2019   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    Rationale for Evaluation and Treatment: Rehabilitation  THERAPY DIAG:  Other low back pain  Muscle spasm of back  ONSET DATE: 2  years+  SUBJECTIVE:                                                                                                                                                                                           SUBJECTIVE STATEMENT: Pt reports 8/10 right hip and low back pain today.  PERTINENT HISTORY:  H/o hip bursitis.    PAIN:  Are you having pain? Yes: NPRS scale: 8/10.  Pain location: LB/Hips Pain description: Ache, sharp and shooting.   Aggravating factors: As above. Relieving factors: As above.    PRECAUTIONS: None  RED FLAGS: None   WEIGHT BEARING RESTRICTIONS: No  FALLS:  Has patient fallen in last 6 months? No  LIVING ENVIRONMENT: Lives in: House/apartment Has following equipment at home: None  OCCUPATION: Retired.  PLOF: Independent  PATIENT GOALS: Sleep better and do more with less pain.      OBJECTIVE:  Note: Objective measures were completed at Evaluation unless otherwise noted.  DIAGNOSTIC FINDINGS:  01/25/24:  IMPRESSION: 1. No acute findings or clear explanation for the patient's symptoms. 2. Multilevel spondylosis with disc bulging, endplate osteophytes and facet hypertrophy as described. There is mild spinal stenosis at L3-4 with mild right-greater-than-left foraminal and right lateral recess narrowing. 3. Mild right lateral recess and biforaminal narrowing at L4-5. 4. Mild to moderate left foraminal narrowing at L5-S1. 5. Grossly stable fusiform dilatation of the distal thoracic and proximal abdominal aorta compared with prior CT's.  PATIENT SURVEYS:  ODI: 24/50.  PALPATION: Tender to palpation over bilateral lower lumbar region and  SIJ's, right > left and bilateral hip musculature.  LUMBAR ROM:   Full active lumbar flexion and extension to 15 degrees.    LOWER EXTREMITY ROM:     WNL.  LOWER EXTREMITY MMT:    Normal bilateral LE strength.  LUMBAR SPECIAL TESTS:  Equal leg lengths.  (-) SLR testing.  Positive right FABER test.     GAIT: Slow and purposeful.  TREATMENT DATE:   03/20/24                                  EXERCISE LOG  Exercise Repetitions and Resistance Comments  Nustep Lvl 3 x 16 mins        Blank cell = exercise not performed today   Manual Therapy Soft Tissue Mobilization: Right hip and low back, STW/M to right lumbar paraspinals and QL to decrease pain and tone   Modalities  Date:  Unattended Estim: Lumbar and Hip, IFC 80-150 Hz, 15 mins, Pain and Tone   03/10/24:  Reviewed HEP and added dying bug x6 each side  and hold for 10 secs  No bridging due to increased pain Discussed movement patterns and AD to help with ADL's and decrease pain triggers STW/M  to RT side LB paras, SIJ, and upper glute with Pt LT side lying x 10  minutes    IFC at 80-150 Hz on 40% scan to patient's bilateral lower lumbar region and SIJ's x 15 mins    (patient in left sdly position with pillows between knees for comfort) to patient's   right lower lumbar, SIJ and lateral hip musculature.    Normal modality response following removal of modality.                                                                                                                     PATIENT EDUCATION:  Education details: See below. Person educated: Patient Education method: Explanation, Demonstration, Tactile cues, and Handouts Education comprehension: verbalized understanding and returned demonstration  HOME EXERCISE PROGRAM: SKTC  [FNZMSVS]  SINGLE KNEE TO CHEST STRETCH - SKTC -  Repeat 3 Repetitions, Hold 1 Minute, Complete 1 Set, Perform 3 Times a Day  DOUBLE KNEE TO CHEST STRETCH - DKTC -  Repeat 2 Repetitions, Hold 1 Minute, Complete 1 Set, Perform 3 Times a Day   ASSESSMENT:  CLINICAL IMPRESSION: Pt arrives for today's treatment session reporting 8/10 right low back pain.  Pt able to tolerate introduction to Nustep today without discomfort.  STW/M performed to right lumbar paraspinals and QL to decrease pain and tone.   Normal responses to estim noted upon removal.  Pt states that she is performing stretches at home as previously instructed.  Pt reported 2/10 right low back pain at completion of today's treatment session.  OBJECTIVE IMPAIRMENTS: decreased activity tolerance, decreased ROM, increased muscle spasms, and pain.   ACTIVITY LIMITATIONS: carrying, lifting, bending, standing, and sleeping  PARTICIPATION LIMITATIONS: meal prep, cleaning, laundry, and yard work  PERSONAL FACTORS: Time since onset of injury/illness/exacerbation and 1 comorbidity: h/o hip bursitis are also affecting patient's functional outcome.   REHAB POTENTIAL: Good  CLINICAL DECISION MAKING: Evolving/moderate complexity  EVALUATION COMPLEXITY: Low   GOALS:  SHORT TERM GOALS: Target date: 03/20/24  Ind with a HEP. Goal status: INITIAL   LONG TERM GOALS: Target date: 04/17/24  Sleep 6 hours undisturbed.  Goal status: INITIAL  2.  Perform ADL's with pain not > 3-4/10.  Goal status: INITIAL  3.  Improve ODI by at least 5 points.  Goal status: INITIAL  PLAN:  PT FREQUENCY: 2x/week  PT DURATION: 6 weeks  PLANNED INTERVENTIONS: 97110-Therapeutic  exercises, 97530- Therapeutic activity, [id number]- Neuromuscular re-education, [id number]- Self Care, 02859- Manual therapy, G0283- Electrical stimulation (unattended), 97035- Ultrasound, Patient/Family education, and Moist heat.  PLAN FOR NEXT SESSION: S and DKTC, hip bridges, core exercise progression.  Spinal protection technique techniques.  Body mechanics training.  Modalities and STW/M as needed.     Delon DELENA Gosling, PTA 03/20/2024, 11:50 AM

## 2024-03-21 ENCOUNTER — Encounter: Payer: Self-pay | Admitting: Cardiology

## 2024-03-21 ENCOUNTER — Ambulatory Visit: Attending: Cardiology | Admitting: Cardiology

## 2024-03-21 VITALS — BP 159/92 | HR 55 | Resp 16 | Ht 63.0 in | Wt 134.6 lb

## 2024-03-21 DIAGNOSIS — R0609 Other forms of dyspnea: Secondary | ICD-10-CM

## 2024-03-21 DIAGNOSIS — E7800 Pure hypercholesterolemia, unspecified: Secondary | ICD-10-CM

## 2024-03-21 DIAGNOSIS — I7121 Aneurysm of the ascending aorta, without rupture: Secondary | ICD-10-CM | POA: Diagnosis not present

## 2024-03-21 DIAGNOSIS — I351 Nonrheumatic aortic (valve) insufficiency: Secondary | ICD-10-CM

## 2024-03-21 DIAGNOSIS — I10 Essential (primary) hypertension: Secondary | ICD-10-CM

## 2024-03-21 NOTE — Patient Instructions (Addendum)
 Medication Instructions:  Your physician recommends that you continue on your current medications as directed. Please refer to the Current Medication list given to you today.  *If you need a refill on your cardiac medications before your next appointment, please call your pharmacy*  Lab Work: Lipids and BMP - today  You may go to any [facility] Location for your lab work:  [facility] - [address] ([facility]) - [address] [phone] [address]  [place] - [address]   [place]  - [address]   [place] - [address] - [address] [employer]  - [address] - [address] ([facility]   If you have labs (blood work) drawn today and your tests are completely normal, you will receive your results only by: [employer] (if you have MyChart)  If you have any lab test that is abnormal or we need to change your treatment, we will call you or send a MyChart message to review the results.  Testing/Procedures: None ordered.  Follow-Up: At [facility], you and your health needs are our priority.  As part of our continuing mission to provide you with exceptional heart care, we have created designated Provider Care Teams.  These Care Teams include your primary Cardiologist (physician) and Advanced Practice Providers (APPs -  Physician Assistants and Nurse Practitioners) who all work together to provide you with the care you need, when you need it.    The format for your next appointment:   In Person  Provider:   Gordy Bergamo, MD

## 2024-03-21 NOTE — Progress Notes (Signed)
 Cardiology Office Note:  .   Date:  03/21/2024  ID:  Julie Day, DOB Jun 12, 1946, MRN [id number] PCP: Signa Rush, MD (Inactive)  [facility] Providers Cardiologist:  Gordy Bergamo, MD   History of Present Illness: .   Julie Day is a 78 y.o. Caucasian female patient with moderate-sized ascending aortic aneurysm and small infrarenal abdominal aortic aneurysm by last CT scan done in March 2025 and stable since 05/27/2023. Past medical history significant for hypertension.  Presents for follow-up of marked dyspnea on exertion, underwent nuclear stress testing and presents for follow-up.  Nuclear scan revealing mild basal anteroseptal ischemia with preserved EF overall considered to be low risk.  She has had an echocardiogram on 11/01/2022 revealing moderate aortic regurgitation with preserved LVEF.  Cardiac Studies relevent.    CT angiogram chest and abdomen 10/26/2023: Comparison 05/27/2023 and 03/23/2022. 1. Stable 4.9 cm aneurysmal dilatation of the ascending thoracic aorta. This is stable since the prior chest CT, but does show increase in size from approximately 4.6 cm on the 2023 study. There also is aneurysmal disease involving the aortic arch and diffuse dilatation of the entire descending thoracic aorta extending into the proximal abdominal aorta. 2. Lungs/Pleura: Stable advanced emphysematous lung disease. Stable scattered small pulmonary nodules,  ECHOCARDIOGRAM COMPLETE 11/01/2023  1. Left ventricular ejection fraction, by estimation, is 55 to 60%. Left ventricular ejection fraction by PLAX is 57 %. The left ventricle has normal function. The left ventricle has no regional wall motion abnormalities. There is mild left ventricular hypertrophy. Left ventricular diastolic parameters are consistent with Grade I diastolic dysfunction (impaired relaxation). 2. The aortic valve is tricuspid. Aortic valve regurgitation is moderate. No aortic stenosis is present. 3. Aortic  dilatation noted. There is moderate dilatation of the ascending aorta, measuring 47 mm.  MYOCARDIAL PERFUSION IMAGING 03/09/2024     Findings are consistent with a very small area of basal anteroseptal ischemia. The study is low risk.   No ST deviation was noted.   Left ventricular function is normal. End diastolic cavity size is normal.   CT images were obtained for attenuation correction and were examined for the presence of coronary calcium when appropriate.   Coronary calcium was present on the attenuation correction CT images. Moderate coronary calcifications were present. Coronary calcifications were present in the left anterior descending artery distribution(s).    Discussed the use of AI scribe software for clinical note transcription with the patient, who gave verbal consent to proceed.  History of Present Illness Julie Day is a 78 year old female with aortic valve regurgitation and suspected coronary artery disease who presents with worsening shortness of breath. She is accompanied by her sister.  She experiences significant shortness of breath, which has progressively worsened. During a recent stress test, she became very short of breath without physical activity. Severe headaches and thigh pain occurred due to the medication used during the test, alleviated within five to six minutes after receiving medication.  She has moderate aortic valve regurgitation. Her blood pressure medication was recently changed from Maxzide to losartan , resulting in elevated blood pressure readings. She is currently on losartan  for blood pressure and aneurysm management.  She is a smoker but has reduced her smoking recently. She wants to reduce smoking further but is not ready to quit completely.   Labs   Care everywhere/Faxed External Labs:  are everywhere labs 12/29/2023:   Hb 13.1/HCT 38.5, platelets 297, normal indicis.   Serum glucose 76 mg, BUN 19, creatinine 0.78, EGFR  78 mL, potassium  4.2, LFTs normal.   Vitamin D 27.7.   Total cholesterol 178, triglycerides 133, HDL 51, LDL 104.  ROS  Review of Systems  Cardiovascular:  Positive for dyspnea on exertion. Negative for chest pain, leg swelling, orthopnea and paroxysmal nocturnal dyspnea.   Physical Exam:   VS:  BP (!) 159/92 (BP Location: Left Arm, Patient Position: Sitting, Cuff Size: Normal)   Pulse (!) 55   Resp 16   Ht 5' 3 (1.6 m)   Wt 134 lb 9.6 oz (61.1 kg)   SpO2 98%   BMI 23.84 kg/m    Wt Readings from Last 3 Encounters:  03/21/24 134 lb 9.6 oz (61.1 kg)  02/29/24 134 lb 9.6 oz (61.1 kg)  11/02/23 127 lb (57.6 kg)    BP Readings from Last 3 Encounters:  03/21/24 (!) 159/92  02/29/24 108/72  11/02/23 110/71   Physical Exam Neck:     Vascular: No carotid bruit or JVD.  Cardiovascular:     Rate and Rhythm: Normal rate and regular rhythm.     Pulses: Intact distal pulses.     Heart sounds: Murmur heard.     Early systolic murmur of grade 2/6 is also present at the upper right sternal border.     Blowing early diastolic murmur is present with a grade of 2/4 at the upper right sternal border radiating to the apex.     No gallop.  Pulmonary:     Effort: Pulmonary effort is normal.     Breath sounds: Normal breath sounds.  Abdominal:     General: Bowel sounds are normal.     Palpations: Abdomen is soft.  Musculoskeletal:     Right lower leg: No edema.     Left lower leg: No edema.    EKG:         ASSESSMENT AND PLAN: .     Assessment & Plan Shortness of breath Shortness of breath  potentially due to aortic valve regurgitation, coronary artery disease with mild small basal anterior wall ischemia, emphysema, hypertension, and age-related factors.  Stress disorder low risk.  Aortic valve regurgitation Moderate aortic valve regurgitation contributes to shortness of breath, but the severity of symptoms suggests additional factors. - No clinical evidence of heart failure.  Coronary artery  disease with mild basal anterior septal small sized ischemia Stress test shows low-risk potential blockage in the anterior heart. Medical management is preferred over invasive procedures. Cholesterol management is crucial to reduce heart attack risk and blockage progression. - Continue simvastatin  and monitor cholesterol levels, goal LDL <70 - Consider increasing simvastatin  dose if cholesterol levels are not at target  Hypertension Blood pressure has increased since switching from Maxzide to losartan  50 mg daily. Losartan  benefits aortic aneurysm management. - Restart Maxzide (triamterene/hydrochlorothiazide) 37.5/25 mg 1 p.o. every morning - Monitor blood pressure regularly - Adjust Maxide dose if blood pressure drops too low and cut the medication in half 1/2 tablet daily.  Tobacco use She is smoking less but not ready to quit completely. Smoking contributes to coronary calcification and emphysema. - Encourage reduction in smoking and eventual cessation  Emphysema CT scan of the chest reveals centrilobular and paraseptal emphysema which may contribute to shortness of breath. Smoking cessation is important to prevent progression. - Encourage reduction in smoking and eventual cessation   Follow up: 6 months for follow-up.  Signed,  Gordy Bergamo, MD, [facility] 03/21/2024, 12:24 PM [facility] [address] [place], [place] [address] Phone: [phone].  Fax:  [phone]

## 2024-03-22 LAB — BASIC METABOLIC PANEL WITH GFR
BUN/Creatinine Ratio: 18 (ref 12–28)
BUN: 13 mg/dL (ref 8–27)
CO2: 23 mmol/L (ref 20–29)
Calcium: 9.8 mg/dL (ref 8.7–10.3)
Chloride: 105 mmol/L (ref 96–106)
Creatinine, Ser: 0.72 mg/dL (ref 0.57–1.00)
Glucose: 82 mg/dL (ref 70–99)
Potassium: 4.2 mmol/L (ref 3.5–5.2)
Sodium: 143 mmol/L (ref 134–144)
eGFR: 86 mL/min/1.73 (ref >59)

## 2024-03-23 ENCOUNTER — Encounter: Payer: Self-pay | Admitting: Cardiology

## 2024-03-23 ENCOUNTER — Ambulatory Visit

## 2024-03-23 DIAGNOSIS — M5459 Other low back pain: Secondary | ICD-10-CM

## 2024-03-23 DIAGNOSIS — M62830 Muscle spasm of back: Secondary | ICD-10-CM | POA: Diagnosis not present

## 2024-03-23 NOTE — Therapy (Signed)
 OUTPATIENT PHYSICAL THERAPY THORACOLUMBAR TREATMENT   Patient Name: Julie Day MRN: [id number] DOB:November 08, 1945, 78 y.o., female Today's Date: 03/23/2024  END OF SESSION:  PT End of Session - 03/23/24 1024     Visit Number 4    Number of Visits 12    Date for PT Re-Evaluation 04/17/24    PT Start Time 1015    PT Stop Time 1100    PT Time Calculation (min) 45 min    Activity Tolerance Patient tolerated treatment well    Behavior During Therapy [facility] for tasks assessed/performed          Past Medical History:  Diagnosis Date   09/08/2021    Allergic rhinitis    Aneurysm of thoracic aorta (HCC)    Anginal pain (HCC)    Dx as GERD   Atypical mole 09/23/2017   Left Inner Thigh-Mild   Atypical mole 09/23/2017   Right Axilla-Mild   Atypical mole 09/05/2015   Center Abdomen-Severe (clear) (Dr. Shona)   Atypical mole 04/14/2019   Right Thigh-Solar Lentigo   Chronic cystitis    Degenerative disc disease    Esophageal fistula    GERD (gastroesophageal reflux disease)    Hemangioma of liver    History of hiatal hernia 2017   Hypertension    SCC (squamous cell carcinoma) 09/05/2015   Left Dorsal Hand-Well Diff Keratoacanthoma (Dr. Shona) (free)   SCC (squamous cell carcinoma) 04/14/2019   Left Outer Shin-Keratoacanthoma (treatment after biopsy)   SCC (squamous cell carcinoma) 07/21/2019   Right Thigh-Keratoacanthoma (treatment Fluorouracil Cream)   Severe protein-calorie malnutrition (HCC)    Squamous cell carcinoma of skin 09/23/2017   Right Upper Shin-Well Diff (Cx3,5FU)   Past Surgical History:  Procedure Laterality Date   ABDOMINAL HYSTERECTOMY     CHOLECYSTECTOMY     COLON RESECTION N/A 05/31/2015   Procedure: LAPAROSCOPIC REPAIR OF HIATAL HERNIA  AND LAPAROSCOPIC NISSEN FUNDOPLICATION;  Surgeon: Elon Pacini, MD;  Location: [facility] OR;  Service: General;  Laterality: N/A;   ESOPHAGEAL MANOMETRY N/A 02/27/2015   Procedure: ESOPHAGEAL MANOMETRY (EM);  Surgeon: Gladis MARLA Louder, MD;  Location: [facility] ENDOSCOPY;  Service: Endoscopy;  Laterality: N/A;   ESOPHAGOGASTRODUODENOSCOPY N/A 03/25/2022   Procedure: ESOPHAGOGASTRODUODENOSCOPY (EGD);  Surgeon: Dianna Specking, MD;  Location: [facility] ENDOSCOPY;  Service: Gastroenterology;  Laterality: N/A;   ESOPHAGOGASTRODUODENOSCOPY (EGD) WITH PROPOFOL  N/A 02/26/2015   Procedure: ESOPHAGOGASTRODUODENOSCOPY (EGD) WITH PROPOFOL ;  Surgeon: Gladis MARLA Louder, MD;  Location: [facility] ENDOSCOPY;  Service: Endoscopy;  Laterality: N/A;   HIATAL HERNIA REPAIR  05/31/2015   HIATAL HERNIA REPAIR N/A 09/08/2021   Procedure: LAPAROSCOPIC REVISION HIATAL HERNIA REPAIR WITH FUNDOPLICATION AND MESH;  Surgeon: Kinsinger, Herlene Righter, MD;  Location: [facility] ORS;  Service: General;  Laterality: N/A;   IR GASTROSTOMY TUBE MOD SED  03/27/2022   IR PATIENT EVAL TECH 0-60 MINS  04/07/2022   IR RADIOLOGIST EVAL & MGMT  04/07/2022   LAPAROSCOPIC INSERTION GASTROSTOMY TUBE N/A 09/08/2021   Procedure: LAPAROSCOPIC INSERTION GASTROSTOMY TUBE;  Surgeon: Stevie Herlene Righter, MD;  Location: [facility] ORS;  Service: General;  Laterality: N/A;  18FR   TOTAL KNEE ARTHROPLASTY Right 10/03/2019   Procedure: RIGHT TOTAL KNEE ARTHROPLASTY;  Surgeon: Sheril Coy, MD;  Location: [facility] ORS;  Service: Orthopedics;  Laterality: Right;   WISDOM TOOTH EXTRACTION     Patient Active Problem List   Diagnosis Date Noted   Esophageal fistula 04/11/2022   Abnormal barium swallow 04/11/2022   Esophageal dysphagia 04/11/2022   Cellulitis of abdominal wall 04/11/2022  Abdominal pain 04/11/2022   Unintentional weight loss 04/11/2022   Protein-calorie malnutrition, severe 03/26/2022   Esophageal perforation 03/24/2022   Hiatal hernia 09/08/2021   Primary localized osteoarthritis of right knee 10/03/2019   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    Rationale for Evaluation and  Treatment: Rehabilitation  THERAPY DIAG:  Other low back pain  Muscle spasm of back  ONSET DATE: 2 years+  SUBJECTIVE:                                                                                                                                                                                           SUBJECTIVE STATEMENT: Patient states they are feeling pretty good today in about 5/10 low back pain.  PERTINENT HISTORY:  H/o hip bursitis.    PAIN:  Are you having pain? Yes: NPRS scale: 5/10.  Pain location: LB/Hips Pain description: Ache, sharp and shooting.   Aggravating factors: As above. Relieving factors: As above.    PRECAUTIONS: None  RED FLAGS: None   WEIGHT BEARING RESTRICTIONS: No  FALLS:  Has patient fallen in last 6 months? No  LIVING ENVIRONMENT: Lives in: House/apartment Has following equipment at home: None  OCCUPATION: Retired.  PLOF: Independent  PATIENT GOALS: Sleep better and do more with less pain.      OBJECTIVE:  Note: Objective measures were completed at Evaluation unless otherwise noted.  DIAGNOSTIC FINDINGS:  01/25/24:  IMPRESSION: 1. No acute findings or clear explanation for the patient's symptoms. 2. Multilevel spondylosis with disc bulging, endplate osteophytes and facet hypertrophy as described. There is mild spinal stenosis at L3-4 with mild right-greater-than-left foraminal and right lateral recess narrowing. 3. Mild right lateral recess and biforaminal narrowing at L4-5. 4. Mild to moderate left foraminal narrowing at L5-S1. 5. Grossly stable fusiform dilatation of the distal thoracic and proximal abdominal aorta compared with prior CT's.  PATIENT SURVEYS:  ODI: 24/50.  PALPATION: Tender to palpation over bilateral lower lumbar region and  SIJ's, right > left and bilateral hip musculature.  LUMBAR ROM:   Full active lumbar flexion and extension to 15 degrees.    LOWER EXTREMITY ROM:     WNL.  LOWER EXTREMITY  MMT:    Normal bilateral LE strength.  LUMBAR SPECIAL TESTS:  Equal leg lengths.  (-) SLR testing.  Positive right FABER test.    GAIT: Slow and purposeful.  TREATMENT DATE:   03/23/2024  EXERCISE LOG  Exercise Repetitions and Resistance Comments  Nustep  Lv 3 x15 min    Ball Roll-outs 1 min ea Forward, each side   Lumbar rotation 2 min x2    Open book stretch 4 min    Paloff press  1x15 ea dir.   Bed mobility Log roll        Blank cell = exercise not performed today   03/20/24                                  EXERCISE LOG  Exercise Repetitions and Resistance Comments  Nustep Lvl 3 x 16 mins        Blank cell = exercise not performed today   Manual Therapy Soft Tissue Mobilization: Right hip and low back, STW/M to right lumbar paraspinals and QL to decrease pain and tone   Modalities  Date:  Unattended Estim: Lumbar and Hip, IFC 80-150 Hz, 15 mins, Pain and Tone   03/10/24:  Reviewed HEP and added dying bug x6 each side  and hold for 10 secs  No bridging due to increased pain Discussed movement patterns and AD to help with ADL's and decrease pain triggers STW/M  to RT side LB paras, SIJ, and upper glute with Pt LT side lying x 10  minutes    IFC at 80-150 Hz on 40% scan to patient's bilateral lower lumbar region and SIJ's x 15 mins    (patient in left sdly position with pillows between knees for comfort) to patient's   right lower lumbar, SIJ and lateral hip musculature.    Normal modality response following removal of modality.                                                                                                                     PATIENT EDUCATION:  Education details: See below. Person educated: Patient Education method: Explanation, Demonstration, Tactile cues, and Handouts Education comprehension: verbalized understanding and returned demonstration  HOME EXERCISE PROGRAM: SKTC  [[id number]]  SINGLE KNEE TO CHEST  STRETCH - SKTC -  Repeat 3 Repetitions, Hold 1 Minute, Complete 1 Set, Perform 3 Times a Day  DOUBLE KNEE TO CHEST STRETCH - DKTC -  Repeat 2 Repetitions, Hold 1 Minute, Complete 1 Set, Perform 3 Times a Day  Medbridge: [id number]  ASSESSMENT:  CLINICAL IMPRESSION:  Patient presents today stating 5/10 low back pain. Progressed patient today with lumbar mobility exercises and stretching. Patient tolerated exercises well and given a HEP to incorporate into daily life for mobility and pain reduction. Progressed patient today with core strengthening with Paloff presses which patient required verbal and tactile cueing to perform correctly with neutral spine. Patient states they have a TENS unit at home and will use that for e-stim, elects not to have it done today in clinic due to feeling pretty good at completion of session. Patient continues to require skilled physical therapy to address impairments  below.   OBJECTIVE IMPAIRMENTS: decreased activity tolerance, decreased ROM, increased muscle spasms, and pain.   ACTIVITY LIMITATIONS: carrying, lifting, bending, standing, and sleeping  PARTICIPATION LIMITATIONS: meal prep, cleaning, laundry, and yard work  PERSONAL FACTORS: Time since onset of injury/illness/exacerbation and 1 comorbidity: h/o hip bursitis are also affecting patient's functional outcome.   REHAB POTENTIAL: Good  CLINICAL DECISION MAKING: Evolving/moderate complexity  EVALUATION COMPLEXITY: Low   GOALS:  SHORT TERM GOALS: Target date: 03/20/24  Ind with a HEP. Goal status: INITIAL   LONG TERM GOALS: Target date: 04/17/24  Sleep 6 hours undisturbed.  Goal status: INITIAL  2.  Perform ADL's with pain not > 3-4/10.  Goal status: INITIAL  3.  Improve ODI by at least 5 points.  Goal status: INITIAL  PLAN:  PT FREQUENCY: 2x/week  PT DURATION: 6 weeks  PLANNED INTERVENTIONS: 97110-Therapeutic exercises, 97530- Therapeutic activity, [id number]- Neuromuscular  re-education, 97535- Self Care, [id number]- Manual therapy, [id number]- Electrical stimulation (unattended), 97035- Ultrasound, Patient/Family education, and Moist heat.  PLAN FOR NEXT SESSION: S and DKTC, hip bridges, core exercise progression.  Spinal protection technique techniques.  Body mechanics training.  Modalities and STW/M as needed.     Estefana Jude, Student-PT 03/23/2024, 11:16 AM

## 2024-03-27 ENCOUNTER — Other Ambulatory Visit (HOSPITAL_COMMUNITY): Payer: Self-pay

## 2024-03-27 ENCOUNTER — Ambulatory Visit

## 2024-03-27 DIAGNOSIS — M62830 Muscle spasm of back: Secondary | ICD-10-CM

## 2024-03-27 DIAGNOSIS — M5459 Other low back pain: Secondary | ICD-10-CM

## 2024-03-27 NOTE — Therapy (Signed)
 OUTPATIENT PHYSICAL THERAPY THORACOLUMBAR TREATMENT   Patient Name: Julie Day MRN: [id number] DOB:[birth year], 78 y.o., female Today's Date: 03/27/2024  END OF SESSION:  PT End of Session - 03/27/24 1103     Visit Number 5    Number of Visits 12    Date for PT Re-Evaluation 04/17/24    PT Start Time 1100    PT Stop Time 1145    PT Time Calculation (min) 45 min    Activity Tolerance Patient tolerated treatment well    Behavior During Therapy [facility] for tasks assessed/performed          Past Medical History:  Diagnosis Date   09/08/2021    Allergic rhinitis    Aneurysm of thoracic aorta (HCC)    Anginal pain (HCC)    Dx as GERD   Atypical mole 09/23/2017   Left Inner Thigh-Mild   Atypical mole 09/23/2017   Right Axilla-Mild   Atypical mole 09/05/2015   Center Abdomen-Severe (clear) (Dr. Shona)   Atypical mole 04/14/2019   Right Thigh-Solar Lentigo   Chronic cystitis    Degenerative disc disease    Esophageal fistula    GERD (gastroesophageal reflux disease)    Hemangioma of liver    History of hiatal hernia 2017   Hypertension    SCC (squamous cell carcinoma) 09/05/2015   Left Dorsal Hand-Well Diff Keratoacanthoma (Dr. Shona) (free)   SCC (squamous cell carcinoma) 04/14/2019   Left Outer Shin-Keratoacanthoma (treatment after biopsy)   SCC (squamous cell carcinoma) 07/21/2019   Right Thigh-Keratoacanthoma (treatment Fluorouracil Cream)   Severe protein-calorie malnutrition (HCC)    Squamous cell carcinoma of skin 09/23/2017   Right Upper Shin-Well Diff (Cx3,5FU)   Past Surgical History:  Procedure Laterality Date   ABDOMINAL HYSTERECTOMY     CHOLECYSTECTOMY     COLON RESECTION N/A 05/31/2015   Procedure: LAPAROSCOPIC REPAIR OF HIATAL HERNIA  AND LAPAROSCOPIC NISSEN FUNDOPLICATION;  Surgeon: Elon Pacini, MD;  Location: [facility];  Service: General;  Laterality: N/A;   ESOPHAGEAL MANOMETRY N/A 02/27/2015   Procedure: ESOPHAGEAL MANOMETRY (EM);  Surgeon: Gladis MARLA Louder, MD;  Location: [facility];  Service: Endoscopy;  Laterality: N/A;   ESOPHAGOGASTRODUODENOSCOPY N/A 03/25/2022   Procedure: ESOPHAGOGASTRODUODENOSCOPY (EGD);  Surgeon: Dianna Specking, MD;  Location: [facility] ENDOSCOPY;  Service: Gastroenterology;  Laterality: N/A;   ESOPHAGOGASTRODUODENOSCOPY (EGD) WITH PROPOFOL  N/A 02/26/2015   Procedure: ESOPHAGOGASTRODUODENOSCOPY (EGD) WITH PROPOFOL ;  Surgeon: Gladis MARLA Louder, MD;  Location: [facility];  Service: Endoscopy;  Laterality: N/A;   HIATAL HERNIA REPAIR  05/31/2015   HIATAL HERNIA REPAIR N/A 09/08/2021   Procedure: LAPAROSCOPIC REVISION HIATAL HERNIA REPAIR WITH FUNDOPLICATION AND MESH;  Surgeon: Kinsinger, Herlene Righter, MD;  Location: [facility];  Service: General;  Laterality: N/A;   IR GASTROSTOMY TUBE MOD SED  03/27/2022   IR PATIENT EVAL TECH 0-60 MINS  04/07/2022   IR RADIOLOGIST EVAL & MGMT  04/07/2022   LAPAROSCOPIC INSERTION GASTROSTOMY TUBE N/A 09/08/2021   Procedure: LAPAROSCOPIC INSERTION GASTROSTOMY TUBE;  Surgeon: Stevie Herlene Righter, MD;  Location: [facility];  Service: General;  Laterality: N/A;  18FR   TOTAL KNEE ARTHROPLASTY Right 10/03/2019   Procedure: RIGHT TOTAL KNEE ARTHROPLASTY;  Surgeon: Sheril Coy, MD;  Location: [facility];  Service: Orthopedics;  Laterality: Right;   WISDOM TOOTH EXTRACTION     Patient Active Problem List   Diagnosis Date Noted   Esophageal fistula 04/11/2022   Abnormal barium swallow 04/11/2022   Esophageal dysphagia 04/11/2022   Cellulitis of abdominal wall 04/11/2022  Abdominal pain 04/11/2022   Unintentional weight loss 04/11/2022   Protein-calorie malnutrition, severe 03/26/2022   Esophageal perforation 03/24/2022   Hiatal hernia 09/08/2021   Primary localized osteoarthritis of right knee 10/03/2019   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    Rationale for Evaluation and  Treatment: Rehabilitation  THERAPY DIAG:  Other low back pain  Muscle spasm of back  ONSET DATE: 2 years+  SUBJECTIVE:                                                                                                                                                                                           SUBJECTIVE STATEMENT: Pt reports 2/10 low back pain today.   PERTINENT HISTORY:  H/o hip bursitis.    PAIN:  Are you having pain? Yes: NPRS scale: 2/10.  Pain location: LB/Hips Pain description: Ache, sharp and shooting.   Aggravating factors: As above. Relieving factors: As above.    PRECAUTIONS: None  RED FLAGS: None   WEIGHT BEARING RESTRICTIONS: No  FALLS:  Has patient fallen in last 6 months? No  LIVING ENVIRONMENT: Lives in: House/apartment Has following equipment at home: None  OCCUPATION: Retired.  PLOF: Independent  PATIENT GOALS: Sleep better and do more with less pain.      OBJECTIVE:  Note: Objective measures were completed at Evaluation unless otherwise noted.  DIAGNOSTIC FINDINGS:  01/25/24:  IMPRESSION: 1. No acute findings or clear explanation for the patient's symptoms. 2. Multilevel spondylosis with disc bulging, endplate osteophytes and facet hypertrophy as described. There is mild spinal stenosis at L3-4 with mild right-greater-than-left foraminal and right lateral recess narrowing. 3. Mild right lateral recess and biforaminal narrowing at L4-5. 4. Mild to moderate left foraminal narrowing at L5-S1. 5. Grossly stable fusiform dilatation of the distal thoracic and proximal abdominal aorta compared with prior CT's.  PATIENT SURVEYS:  ODI: 24/50.  PALPATION: Tender to palpation over bilateral lower lumbar region and  SIJ's, right > left and bilateral hip musculature.  LUMBAR ROM:   Full active lumbar flexion and extension to 15 degrees.    LOWER EXTREMITY ROM:     WNL.  LOWER EXTREMITY MMT:    Normal bilateral LE  strength.  LUMBAR SPECIAL TESTS:  Equal leg lengths.  (-) SLR testing.  Positive right FABER test.    GAIT: Slow and purposeful.  TREATMENT DATE:   03/27/24  EXERCISE LOG  Exercise Repetitions and Resistance Comments  Nustep Lvl 3 x 15 mins   Rockerboard 3.5 mins   Forward Step Ups 6 box x 3 mins   Ball Rollouts 1.5 mins each   LAQs 2# x 20 reps bil   Seated Marches 2# x 20 reps bil   Hip Abduction    Seated Ham Curls    STS    Goal Assessment See Below    Blank cell = exercise not performed today   03/23/2024                                    EXERCISE LOG  Exercise Repetitions and Resistance Comments  Nustep  Lv 3 x15 min    Ball Roll-outs 1 min ea Forward, each side   Lumbar rotation 2 min x2    Open book stretch 4 min    Paloff press  1x15 ea dir.   Bed mobility Log roll        Blank cell = exercise not performed today   03/20/24                                  EXERCISE LOG  Exercise Repetitions and Resistance Comments  Nustep Lvl 3 x 16 mins        Blank cell = exercise not performed today   Manual Therapy Soft Tissue Mobilization: Right hip and low back, STW/M to right lumbar paraspinals and QL to decrease pain and tone   Modalities  Date:  Unattended Estim: Lumbar and Hip, IFC 80-150 Hz, 15 mins, Pain and Tone    PATIENT EDUCATION:  Education details: See below. Person educated: Patient Education method: Explanation, Demonstration, Tactile cues, and Handouts Education comprehension: verbalized understanding and returned demonstration  HOME EXERCISE PROGRAM: SKTC  [[id number]]  SINGLE KNEE TO CHEST STRETCH - SKTC -  Repeat 3 Repetitions, Hold 1 Minute, Complete 1 Set, Perform 3 Times a Day  DOUBLE KNEE TO CHEST STRETCH - DKTC -  Repeat 2 Repetitions, Hold 1 Minute, Complete 1 Set, Perform 3 Times a Day  Medbridge: [id number]  ASSESSMENT:  CLINICAL IMPRESSION: Pt arrives for today's treatment session reporting  2/10 low back pain.  Pt able to decrease ODI 4 points today, making good progress towards her goal.  Pt reports that she is able to sleep approx. 2 hours without being disturbed by her back pain which is much improved.  Pt also reports she is able to perform her ADLs with pain reaching 5-6/10.  Pt instructed in several new standing and seated exercises today with good results.  Pt requriing min cues for proper technique and posture with newly added exercises.  Pt denied any pain at completion of today's treatment session.  OBJECTIVE IMPAIRMENTS: decreased activity tolerance, decreased ROM, increased muscle spasms, and pain.   ACTIVITY LIMITATIONS: carrying, lifting, bending, standing, and sleeping  PARTICIPATION LIMITATIONS: meal prep, cleaning, laundry, and yard work  PERSONAL FACTORS: Time since onset of injury/illness/exacerbation and 1 comorbidity: h/o hip bursitis are also affecting patient's functional outcome.   REHAB POTENTIAL: Good  CLINICAL DECISION MAKING: Evolving/moderate complexity  EVALUATION COMPLEXITY: Low   GOALS:  SHORT TERM GOALS: Target date: 03/20/24  Ind with a HEP. Goal status: MET   LONG TERM GOALS: Target date: 04/17/24  Sleep 6 hours undisturbed.  8/25: approx. 2 hours  Goal status: IN PROGRESS  2.  Perform ADL's with pain not > 3-4/10.   8/25: 5-6/10  Goal status: IN PROGRESS  3.  Improve ODI by at least 5 points.   8/25: 20/50 (4 point decrease)  Goal status: IN PROGRESS  PLAN:  PT FREQUENCY: 2x/week  PT DURATION: 6 weeks  PLANNED INTERVENTIONS: 97110-Therapeutic exercises, 97530- Therapeutic activity, [id number]- Neuromuscular re-education, 97535- Self Care, [id number]- Manual therapy, G0283- Electrical stimulation (unattended), 97035- Ultrasound, Patient/Family education, and Moist heat.  PLAN FOR NEXT SESSION: S and DKTC, hip bridges, core exercise progression.  Spinal protection technique techniques.  Body mechanics training.  Modalities and STW/M as  needed.     Delon DELENA Gosling, PTA 03/27/2024, 11:57 AM

## 2024-03-28 LAB — LIPID PANEL W/O CHOL/HDL RATIO
Cholesterol, Total: 120 mg/dL (ref 100–199)
HDL: 48 mg/dL (ref >39)
LDL Chol Calc (NIH): 55 mg/dL (ref 0–99)
Triglycerides: 91 mg/dL (ref 0–149)
VLDL Cholesterol Cal: 17 mg/dL (ref 5–40)

## 2024-03-28 LAB — SPECIMEN STATUS REPORT

## 2024-03-29 ENCOUNTER — Other Ambulatory Visit (HOSPITAL_COMMUNITY): Payer: Self-pay

## 2024-03-29 NOTE — Progress Notes (Signed)
 NOrmal kidney function and normal cholesterol levels. No change in Rx.

## 2024-03-30 ENCOUNTER — Ambulatory Visit

## 2024-03-30 DIAGNOSIS — M5459 Other low back pain: Secondary | ICD-10-CM | POA: Diagnosis not present

## 2024-03-30 DIAGNOSIS — M62830 Muscle spasm of back: Secondary | ICD-10-CM

## 2024-03-30 NOTE — Therapy (Signed)
 OUTPATIENT PHYSICAL THERAPY THORACOLUMBAR TREATMENT   Patient Name: Julie Day MRN: [id number] DOB:04/25/1946, 78 y.o., female Today's Date: 03/30/2024  END OF SESSION:  PT End of Session - 03/30/24 1018     Visit Number 6    Number of Visits 12    Date for PT Re-Evaluation 04/17/24    PT Start Time 1015    PT Stop Time 1053    PT Time Calculation (min) 38 min    Activity Tolerance Patient tolerated treatment well    Behavior During Therapy [facility] for tasks assessed/performed          Past Medical History:  Diagnosis Date   09/08/2021    Allergic rhinitis    Aneurysm of thoracic aorta (HCC)    Anginal pain (HCC)    Dx as GERD   Atypical mole 09/23/2017   Left Inner Thigh-Mild   Atypical mole 09/23/2017   Right Axilla-Mild   Atypical mole 09/05/2015   Center Abdomen-Severe (clear) (Dr. Shona)   Atypical mole 04/14/2019   Right Thigh-Solar Lentigo   Chronic cystitis    Degenerative disc disease    Esophageal fistula    GERD (gastroesophageal reflux disease)    Hemangioma of liver    History of hiatal hernia 2017   Hypertension    SCC (squamous cell carcinoma) 09/05/2015   Left Dorsal Hand-Well Diff Keratoacanthoma (Dr. Shona) (free)   SCC (squamous cell carcinoma) 04/14/2019   Left Outer Shin-Keratoacanthoma (treatment after biopsy)   SCC (squamous cell carcinoma) 07/21/2019   Right Thigh-Keratoacanthoma (treatment Fluorouracil Cream)   Severe protein-calorie malnutrition (HCC)    Squamous cell carcinoma of skin 09/23/2017   Right Upper Shin-Well Diff (Cx3,5FU)   Past Surgical History:  Procedure Laterality Date   ABDOMINAL HYSTERECTOMY     CHOLECYSTECTOMY     COLON RESECTION N/A 05/31/2015   Procedure: LAPAROSCOPIC REPAIR OF HIATAL HERNIA  AND LAPAROSCOPIC NISSEN FUNDOPLICATION;  Surgeon: Elon Pacini, MD;  Location: [facility] OR;  Service: General;  Laterality: N/A;   ESOPHAGEAL MANOMETRY N/A 02/27/2015   Procedure: ESOPHAGEAL MANOMETRY (EM);  Surgeon: Gladis MARLA Louder, MD;  Location: [facility];  Service: Endoscopy;  Laterality: N/A;   ESOPHAGOGASTRODUODENOSCOPY N/A 03/25/2022   Procedure: ESOPHAGOGASTRODUODENOSCOPY (EGD);  Surgeon: Dianna Specking, MD;  Location: [facility] ENDOSCOPY;  Service: Gastroenterology;  Laterality: N/A;   ESOPHAGOGASTRODUODENOSCOPY (EGD) WITH PROPOFOL  N/A 02/26/2015   Procedure: ESOPHAGOGASTRODUODENOSCOPY (EGD) WITH PROPOFOL ;  Surgeon: Gladis MARLA Louder, MD;  Location: [facility];  Service: Endoscopy;  Laterality: N/A;   HIATAL HERNIA REPAIR  05/31/2015   HIATAL HERNIA REPAIR N/A 09/08/2021   Procedure: LAPAROSCOPIC REVISION HIATAL HERNIA REPAIR WITH FUNDOPLICATION AND MESH;  Surgeon: Kinsinger, Herlene Righter, MD;  Location: [facility];  Service: General;  Laterality: N/A;   IR GASTROSTOMY TUBE MOD SED  03/27/2022   IR PATIENT EVAL TECH 0-60 MINS  04/07/2022   IR RADIOLOGIST EVAL & MGMT  04/07/2022   LAPAROSCOPIC INSERTION GASTROSTOMY TUBE N/A 09/08/2021   Procedure: LAPAROSCOPIC INSERTION GASTROSTOMY TUBE;  Surgeon: Stevie Herlene Righter, MD;  Location: [facility];  Service: General;  Laterality: N/A;  18FR   TOTAL KNEE ARTHROPLASTY Right 10/03/2019   Procedure: RIGHT TOTAL KNEE ARTHROPLASTY;  Surgeon: Sheril Coy, MD;  Location: [facility];  Service: Orthopedics;  Laterality: Right;   WISDOM TOOTH EXTRACTION     Patient Active Problem List   Diagnosis Date Noted   Esophageal fistula 04/11/2022   Abnormal barium swallow 04/11/2022   Esophageal dysphagia 04/11/2022   Cellulitis of abdominal wall 04/11/2022  Abdominal pain 04/11/2022   Unintentional weight loss 04/11/2022   Protein-calorie malnutrition, severe 03/26/2022   Esophageal perforation 03/24/2022   Hiatal hernia 09/08/2021   Primary localized osteoarthritis of right knee 10/03/2019   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    Rationale for Evaluation and  Treatment: Rehabilitation  THERAPY DIAG:  Other low back pain  Muscle spasm of back  ONSET DATE: 2 years+  SUBJECTIVE:                                                                                                                                                                                           SUBJECTIVE STATEMENT: Pt reports 2/10 low back pain today.   PERTINENT HISTORY:  H/o hip bursitis.    PAIN:  Are you having pain? Yes: NPRS scale: 2/10.  Pain location: LB/Hips Pain description: Ache, sharp and shooting.   Aggravating factors: As above. Relieving factors: As above.    PRECAUTIONS: None  RED FLAGS: None   WEIGHT BEARING RESTRICTIONS: No  FALLS:  Has patient fallen in last 6 months? No  LIVING ENVIRONMENT: Lives in: House/apartment Has following equipment at home: None  OCCUPATION: Retired.  PLOF: Independent  PATIENT GOALS: Sleep better and do more with less pain.      OBJECTIVE:  Note: Objective measures were completed at Evaluation unless otherwise noted.  DIAGNOSTIC FINDINGS:  01/25/24:  IMPRESSION: 1. No acute findings or clear explanation for the patient's symptoms. 2. Multilevel spondylosis with disc bulging, endplate osteophytes and facet hypertrophy as described. There is mild spinal stenosis at L3-4 with mild right-greater-than-left foraminal and right lateral recess narrowing. 3. Mild right lateral recess and biforaminal narrowing at L4-5. 4. Mild to moderate left foraminal narrowing at L5-S1. 5. Grossly stable fusiform dilatation of the distal thoracic and proximal abdominal aorta compared with prior CT's.  PATIENT SURVEYS:  ODI: 24/50.  PALPATION: Tender to palpation over bilateral lower lumbar region and  SIJ's, right > left and bilateral hip musculature.  LUMBAR ROM:   Full active lumbar flexion and extension to 15 degrees.    LOWER EXTREMITY ROM:     WNL.  LOWER EXTREMITY MMT:    Normal bilateral LE  strength.  LUMBAR SPECIAL TESTS:  Equal leg lengths.  (-) SLR testing.  Positive right FABER test.    GAIT: Slow and purposeful.  TREATMENT DATE:   03/30/24  EXERCISE LOG  Exercise Repetitions and Resistance Comments  Nustep Lvl 3 x 17 mins   Rockerboard 4.5 mins   Forward Step Ups 6 box x 4 mins   Ball Rollouts    LAQs 2# x 20 reps bil   Seated Marches 2# x 20 reps bil   Hip Abduction    Seated Ham Curls Red x 20 reps bil   STS    Goal Assessment See Below    Blank cell = exercise not performed today   03/23/2024                                    EXERCISE LOG  Exercise Repetitions and Resistance Comments  Nustep  Lv 3 x15 min    Ball Roll-outs 1 min ea Forward, each side   Lumbar rotation 2 min x2    Open book stretch 4 min    Paloff press  1x15 ea dir.   Bed mobility Log roll        Blank cell = exercise not performed today   03/20/24                                  EXERCISE LOG  Exercise Repetitions and Resistance Comments  Nustep Lvl 3 x 16 mins        Blank cell = exercise not performed today   Manual Therapy Soft Tissue Mobilization: Right hip and low back, STW/M to right lumbar paraspinals and QL to decrease pain and tone   Modalities  Date:  Unattended Estim: Lumbar and Hip, IFC 80-150 Hz, 15 mins, Pain and Tone    PATIENT EDUCATION:  Education details: See below. Person educated: Patient Education method: Explanation, Demonstration, Tactile cues, and Handouts Education comprehension: verbalized understanding and returned demonstration  HOME EXERCISE PROGRAM: SKTC  [[id number]]  SINGLE KNEE TO CHEST STRETCH - SKTC -  Repeat 3 Repetitions, Hold 1 Minute, Complete 1 Set, Perform 3 Times a Day  DOUBLE KNEE TO CHEST STRETCH - DKTC -  Repeat 2 Repetitions, Hold 1 Minute, Complete 1 Set, Perform 3 Times a Day  Medbridge: [id number]  ASSESSMENT:  CLINICAL IMPRESSION: Pt arrives for today's treatment session  reporting 2/10 low back pain.  Pt able to tolerate increased time with standing exercises today without pain.  Pt tolerated addition of seated ham curls today with min cues required for proper technique and eccentric control.  Pt states that she is leaving for the beach after treatment session.  Discussed the pt the importance of stopping and taking breaks during her drive.  Pt denied any pain at completion of today's treatment session, but does endorse increased fatigue and feeling as if she had a good workout.  OBJECTIVE IMPAIRMENTS: decreased activity tolerance, decreased ROM, increased muscle spasms, and pain.   ACTIVITY LIMITATIONS: carrying, lifting, bending, standing, and sleeping  PARTICIPATION LIMITATIONS: meal prep, cleaning, laundry, and yard work  PERSONAL FACTORS: Time since onset of injury/illness/exacerbation and 1 comorbidity: h/o hip bursitis are also affecting patient's functional outcome.   REHAB POTENTIAL: Good  CLINICAL DECISION MAKING: Evolving/moderate complexity  EVALUATION COMPLEXITY: Low   GOALS:  SHORT TERM GOALS: Target date: 03/20/24  Ind with a HEP. Goal status: MET   LONG TERM GOALS: Target date: 04/17/24  Sleep 6 hours undisturbed.  8/25: approx. 2 hours Goal status: IN PROGRESS  2.  Perform ADL's with pain not > 3-4/10.   8/25: 5-6/10  Goal status: IN PROGRESS  3.  Improve ODI by at least 5 points.   8/25: 20/50 (4 point decrease)  Goal status: IN PROGRESS  PLAN:  PT FREQUENCY: 2x/week  PT DURATION: 6 weeks  PLANNED INTERVENTIONS: 97110-Therapeutic exercises, 97530- Therapeutic activity, [id number]- Neuromuscular re-education, 97535- Self Care, 02859- Manual therapy, G0283- Electrical stimulation (unattended), 97035- Ultrasound, Patient/Family education, and Moist heat.  PLAN FOR NEXT SESSION: S and DKTC, hip bridges, core exercise progression.  Spinal protection technique techniques.  Body mechanics training.  Modalities and STW/M as needed.      Delon DELENA Gosling, PTA 03/30/2024, 10:55 AM

## 2024-04-02 DIAGNOSIS — J432 Centrilobular emphysema: Secondary | ICD-10-CM | POA: Diagnosis not present

## 2024-04-02 DIAGNOSIS — I2089 Other forms of angina pectoris: Secondary | ICD-10-CM | POA: Diagnosis not present

## 2024-04-02 DIAGNOSIS — M4696 Unspecified inflammatory spondylopathy, lumbar region: Secondary | ICD-10-CM | POA: Diagnosis not present

## 2024-04-02 DIAGNOSIS — E7800 Pure hypercholesterolemia, unspecified: Secondary | ICD-10-CM | POA: Diagnosis not present

## 2024-04-05 ENCOUNTER — Ambulatory Visit: Attending: Internal Medicine

## 2024-04-05 DIAGNOSIS — M62830 Muscle spasm of back: Secondary | ICD-10-CM | POA: Diagnosis not present

## 2024-04-05 DIAGNOSIS — M5459 Other low back pain: Secondary | ICD-10-CM | POA: Diagnosis not present

## 2024-04-05 NOTE — Therapy (Signed)
 OUTPATIENT PHYSICAL THERAPY THORACOLUMBAR TREATMENT   Patient Name: Julie Day MRN: [id number] DOB:[birth year], 78 y.o., female Today's Date: 04/05/2024  END OF SESSION:  PT End of Session - 04/05/24 1056     Visit Number 7    Number of Visits 12    Date for PT Re-Evaluation 04/17/24    PT Start Time 1056    Activity Tolerance Patient tolerated treatment well    Behavior During Therapy [facility] for tasks assessed/performed           Past Medical History:  Diagnosis Date   09/08/2021    Allergic rhinitis    Aneurysm of thoracic aorta (HCC)    Anginal pain (HCC)    Dx as GERD   Atypical mole 09/23/2017   Left Inner Thigh-Mild   Atypical mole 09/23/2017   Right Axilla-Mild   Atypical mole 09/05/2015   Center Abdomen-Severe (clear) (Dr. Shona)   Atypical mole 04/14/2019   Right Thigh-Solar Lentigo   Chronic cystitis    Degenerative disc disease    Esophageal fistula    GERD (gastroesophageal reflux disease)    Hemangioma of liver    History of hiatal hernia 2017   Hypertension    SCC (squamous cell carcinoma) 09/05/2015   Left Dorsal Hand-Well Diff Keratoacanthoma (Dr. Shona) (free)   SCC (squamous cell carcinoma) 04/14/2019   Left Outer Shin-Keratoacanthoma (treatment after biopsy)   SCC (squamous cell carcinoma) 07/21/2019   Right Thigh-Keratoacanthoma (treatment Fluorouracil Cream)   Severe protein-calorie malnutrition (HCC)    Squamous cell carcinoma of skin 09/23/2017   Right Upper Shin-Well Diff (Cx3,5FU)   Past Surgical History:  Procedure Laterality Date   ABDOMINAL HYSTERECTOMY     CHOLECYSTECTOMY     COLON RESECTION N/A 05/31/2015   Procedure: LAPAROSCOPIC REPAIR OF HIATAL HERNIA  AND LAPAROSCOPIC NISSEN FUNDOPLICATION;  Surgeon: Elon Pacini, MD;  Location: [facility] OR;  Service: General;  Laterality: N/A;   ESOPHAGEAL MANOMETRY N/A 02/27/2015   Procedure: ESOPHAGEAL MANOMETRY (EM);  Surgeon: Gladis MARLA Louder, MD;  Location: [facility] ENDOSCOPY;  Service: Endoscopy;   Laterality: N/A;   ESOPHAGOGASTRODUODENOSCOPY N/A 03/25/2022   Procedure: ESOPHAGOGASTRODUODENOSCOPY (EGD);  Surgeon: Dianna Specking, MD;  Location: [facility] ENDOSCOPY;  Service: Gastroenterology;  Laterality: N/A;   ESOPHAGOGASTRODUODENOSCOPY (EGD) WITH PROPOFOL  N/A 02/26/2015   Procedure: ESOPHAGOGASTRODUODENOSCOPY (EGD) WITH PROPOFOL ;  Surgeon: Gladis MARLA Louder, MD;  Location: [facility] ENDOSCOPY;  Service: Endoscopy;  Laterality: N/A;   HIATAL HERNIA REPAIR  05/31/2015   HIATAL HERNIA REPAIR N/A 09/08/2021   Procedure: LAPAROSCOPIC REVISION HIATAL HERNIA REPAIR WITH FUNDOPLICATION AND MESH;  Surgeon: Kinsinger, Herlene Righter, MD;  Location: [facility] ORS;  Service: General;  Laterality: N/A;   IR GASTROSTOMY TUBE MOD SED  03/27/2022   IR PATIENT EVAL TECH 0-60 MINS  04/07/2022   IR RADIOLOGIST EVAL & MGMT  04/07/2022   LAPAROSCOPIC INSERTION GASTROSTOMY TUBE N/A 09/08/2021   Procedure: LAPAROSCOPIC INSERTION GASTROSTOMY TUBE;  Surgeon: Stevie Herlene Righter, MD;  Location: [facility] ORS;  Service: General;  Laterality: N/A;  18FR   TOTAL KNEE ARTHROPLASTY Right 10/03/2019   Procedure: RIGHT TOTAL KNEE ARTHROPLASTY;  Surgeon: Sheril Coy, MD;  Location: [facility] ORS;  Service: Orthopedics;  Laterality: Right;   WISDOM TOOTH EXTRACTION     Patient Active Problem List   Diagnosis Date Noted   Esophageal fistula 04/11/2022   Abnormal barium swallow 04/11/2022   Esophageal dysphagia 04/11/2022   Cellulitis of abdominal wall 04/11/2022   Abdominal pain 04/11/2022   Unintentional weight loss 04/11/2022   Protein-calorie malnutrition,  severe 03/26/2022   Esophageal perforation 03/24/2022   Hiatal hernia 09/08/2021   Primary localized osteoarthritis of right knee 10/03/2019   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    Rationale for Evaluation and Treatment: Rehabilitation  THERAPY DIAG:  Other low back  pain  Muscle spasm of back  ONSET DATE: 2 years+  SUBJECTIVE:                                                                                                                                                                                           SUBJECTIVE STATEMENT: Patient reports that she is doing a lot better. She was able to ride to the beach and back without any problems. She feels that she is about 80% back to her prior level of function.   PERTINENT HISTORY:  H/o hip bursitis.    PAIN:  Are you having pain? Yes: NPRS scale: 1/10.  Pain location: LB/Hips Pain description: Ache, sharp and shooting.   Aggravating factors: As above. Relieving factors: As above.    PRECAUTIONS: None  RED FLAGS: None   WEIGHT BEARING RESTRICTIONS: No  FALLS:  Has patient fallen in last 6 months? No  LIVING ENVIRONMENT: Lives in: House/apartment Has following equipment at home: None  OCCUPATION: Retired.  PLOF: Independent  PATIENT GOALS: Sleep better and do more with less pain.      OBJECTIVE:  Note: Objective measures were completed at Evaluation unless otherwise noted.  DIAGNOSTIC FINDINGS:  01/25/24:  IMPRESSION: 1. No acute findings or clear explanation for the patient's symptoms. 2. Multilevel spondylosis with disc bulging, endplate osteophytes and facet hypertrophy as described. There is mild spinal stenosis at L3-4 with mild right-greater-than-left foraminal and right lateral recess narrowing. 3. Mild right lateral recess and biforaminal narrowing at L4-5. 4. Mild to moderate left foraminal narrowing at L5-S1. 5. Grossly stable fusiform dilatation of the distal thoracic and proximal abdominal aorta compared with prior CT's.  PATIENT SURVEYS:  ODI: 24/50.  PALPATION: Tender to palpation over bilateral lower lumbar region and  SIJ's, right > left and bilateral hip musculature.  LUMBAR ROM:   Full active lumbar flexion and extension to 15 degrees.    LOWER  EXTREMITY ROM:     WNL.  LOWER EXTREMITY MMT:    Normal bilateral LE strength.  LUMBAR SPECIAL TESTS:  Equal leg lengths.  (-) SLR testing.  Positive right FABER test.    GAIT: Slow and purposeful.  TREATMENT DATE:  04/05/24 EXERCISE LOG  Exercise Repetitions and Resistance Comments  Nustep  L4 x 15 minutes    Rocker board  5 minutes   Open books (standing)  2 minutes   Resisted pull down  Green t-band x 2 minutes    Multifidus press out  Green t-band x 2 minutes each    Self STM with tennis ball     Blank cell = exercise not performed today   03/30/24                                  EXERCISE LOG  Exercise Repetitions and Resistance Comments  Nustep Lvl 3 x 17 mins   Rockerboard 4.5 mins   Forward Step Ups 6 box x 4 mins   Ball Rollouts    LAQs 2# x 20 reps bil   Seated Marches 2# x 20 reps bil   Hip Abduction    Seated Ham Curls Red x 20 reps bil   STS    Goal Assessment See Below    Blank cell = exercise not performed today   03/23/2024                                    EXERCISE LOG  Exercise Repetitions and Resistance Comments  Nustep  Lv 3 x15 min    Ball Roll-outs 1 min ea Forward, each side   Lumbar rotation 2 min x2    Open book stretch 4 min    Paloff press  1x15 ea dir.   Bed mobility Log roll        Blank cell = exercise not performed today    PATIENT EDUCATION:  Education details: HEP, POC, healing, and progress with physical therapy Person educated: Patient Education method: Explanation Education comprehension: verbalized understanding  HOME EXERCISE PROGRAM: SKTC  [[id number]]  SINGLE KNEE TO CHEST STRETCH - SKTC -  Repeat 3 Repetitions, Hold 1 Minute, Complete 1 Set, Perform 3 Times a Day  DOUBLE KNEE TO CHEST STRETCH - DKTC -  Repeat 2 Repetitions, Hold 1 Minute, Complete 1 Set, Perform 3 Times a Day  Medbridge: [id number]  ASSESSMENT:  CLINICAL IMPRESSION: Patient was progressed with multiple new  interventions for improved lumbar stability with moderate difficulty and fatigue. She required minimal cueing with multifidus press outs for proper biomechanics to prevent lumbar rotation. She experienced no increase in pain or discomfort with any of today's interventions. She reported feeling good upon treatment. She continues to require skilled physical therapy to address her remaining impairments to return to her prior level of function.   OBJECTIVE IMPAIRMENTS: decreased activity tolerance, decreased ROM, increased muscle spasms, and pain.   ACTIVITY LIMITATIONS: carrying, lifting, bending, standing, and sleeping  PARTICIPATION LIMITATIONS: meal prep, cleaning, laundry, and yard work  PERSONAL FACTORS: Time since onset of injury/illness/exacerbation and 1 comorbidity: h/o hip bursitis are also affecting patient's functional outcome.   REHAB POTENTIAL: Good  CLINICAL DECISION MAKING: Evolving/moderate complexity  EVALUATION COMPLEXITY: Low   GOALS:  SHORT TERM GOALS: Target date: 03/20/24  Ind with a HEP. Goal status: MET   LONG TERM GOALS: Target date: 04/17/24  Sleep 6 hours undisturbed.  8/25: approx. 2 hours Goal status: IN PROGRESS  2.  Perform ADL's with pain not > 3-4/10.   8/25: 5-6/10  Goal status: IN PROGRESS  3.  Improve ODI by  at least 5 points.   8/25: 20/50 (4 point decrease)  Goal status: IN PROGRESS  PLAN:  PT FREQUENCY: 2x/week  PT DURATION: 6 weeks  PLANNED INTERVENTIONS: 97110-Therapeutic exercises, 97530- Therapeutic activity, [id number]- Neuromuscular re-education, 97535- Self Care, [id number]- Manual therapy, [id number]- Electrical stimulation (unattended), 97035- Ultrasound, Patient/Family education, and Moist heat.  PLAN FOR NEXT SESSION: S and DKTC, hip bridges, core exercise progression.  Spinal protection technique techniques.  Body mechanics training.  Modalities and STW/M as needed.     Lacinda JAYSON Fass, PT 04/05/2024, 12:45 PM

## 2024-04-07 ENCOUNTER — Ambulatory Visit

## 2024-04-07 DIAGNOSIS — M62830 Muscle spasm of back: Secondary | ICD-10-CM | POA: Diagnosis not present

## 2024-04-07 DIAGNOSIS — M5459 Other low back pain: Secondary | ICD-10-CM

## 2024-04-07 NOTE — Therapy (Signed)
 OUTPATIENT PHYSICAL THERAPY THORACOLUMBAR TREATMENT   Patient Name: Julie Day MRN: [id number] DOB:[birth year], 78 y.o., female Today's Date: 04/07/2024  END OF SESSION:  PT End of Session - 04/07/24 1118     Visit Number 8    Number of Visits 12    Date for PT Re-Evaluation 04/17/24    PT Start Time 1100    PT Stop Time 1146    PT Time Calculation (min) 46 min    Activity Tolerance Patient tolerated treatment well    Behavior During Therapy [facility] for tasks assessed/performed           Past Medical History:  Diagnosis Date   09/08/2021    Allergic rhinitis    Aneurysm of thoracic aorta (HCC)    Anginal pain (HCC)    Dx as GERD   Atypical mole 09/23/2017   Left Inner Thigh-Mild   Atypical mole 09/23/2017   Right Axilla-Mild   Atypical mole 09/05/2015   Center Abdomen-Severe (clear) (Dr. Shona)   Atypical mole 04/14/2019   Right Thigh-Solar Lentigo   Chronic cystitis    Degenerative disc disease    Esophageal fistula    GERD (gastroesophageal reflux disease)    Hemangioma of liver    History of hiatal hernia 2017   Hypertension    SCC (squamous cell carcinoma) 09/05/2015   Left Dorsal Hand-Well Diff Keratoacanthoma (Dr. Shona) (free)   SCC (squamous cell carcinoma) 04/14/2019   Left Outer Shin-Keratoacanthoma (treatment after biopsy)   SCC (squamous cell carcinoma) 07/21/2019   Right Thigh-Keratoacanthoma (treatment Fluorouracil Cream)   Severe protein-calorie malnutrition (HCC)    Squamous cell carcinoma of skin 09/23/2017   Right Upper Shin-Well Diff (Cx3,5FU)   Past Surgical History:  Procedure Laterality Date   ABDOMINAL HYSTERECTOMY     CHOLECYSTECTOMY     COLON RESECTION N/A 05/31/2015   Procedure: LAPAROSCOPIC REPAIR OF HIATAL HERNIA  AND LAPAROSCOPIC NISSEN FUNDOPLICATION;  Surgeon: Elon Pacini, MD;  Location: [facility] OR;  Service: General;  Laterality: N/A;   ESOPHAGEAL MANOMETRY N/A 02/27/2015   Procedure: ESOPHAGEAL MANOMETRY (EM);  Surgeon: Gladis MARLA Louder, MD;  Location: [facility] ENDOS[facility];  Service: Endoscopy;  Laterality: N/A;   ESOPHAGOGASTRODUODENOSCOPY N/A 03/25/2022   Procedure: ESOPHAGOGASTRODUODENOSCOPY (EGD);  Surgeon: Dianna Specking, MD;  Location: [facility] ENDOSCOPY;  Service: Gastroenterology;  Laterality: N/A;   ESOPHAGOGASTRODUODENOSCOPY (EGD) WITH PROPOFOL  N/A 02/26/2015   Procedure: ESOPHAGOGASTRODUODENOSCOPY (EGD) WITH PROPOFOL ;  Surgeon: Gladis MARLA Louder, MD;  Location: [facility] ENDOS[facility];  Service: Endoscopy;  Laterality: N/A;   HIATAL HERNIA REPAIR  05/31/2015   HIATAL HERNIA REPAIR N/A 09/08/2021   Procedure: LAPAROSCOPIC REVISION HIATAL HERNIA REPAIR WITH FUNDOPLICATION AND MESH;  Surgeon: Kinsinger, Herlene Righter, MD;  Location: [facility];  Service: General;  Laterality: N/A;   IR GASTROSTOMY TUBE MOD SED  03/27/2022   IR PATIENT EVAL TECH 0-60 MINS  04/07/2022   IR RADIOLOGIST EVAL & MGMT  04/07/2022   LAPAROSCOPIC INSERTION GASTROSTOMY TUBE N/A 09/08/2021   Procedure: LAPAROSCOPIC INSERTION GASTROSTOMY TUBE;  Surgeon: Stevie Herlene Righter, MD;  Location: [facility];  Service: General;  Laterality: N/A;  18FR   TOTAL KNEE ARTHROPLASTY Right 10/03/2019   Procedure: RIGHT TOTAL KNEE ARTHROPLASTY;  Surgeon: Sheril Coy, MD;  Location: [facility];  Service: Orthopedics;  Laterality: Right;   WISDOM TOOTH EXTRACTION     Patient Active Problem List   Diagnosis Date Noted   Esophageal fistula 04/11/2022   Abnormal barium swallow 04/11/2022   Esophageal dysphagia 04/11/2022   Cellulitis of abdominal wall  04/11/2022   Abdominal pain 04/11/2022   Unintentional weight loss 04/11/2022   Protein-calorie malnutrition, severe 03/26/2022   Esophageal perforation 03/24/2022   Hiatal hernia 09/08/2021   Primary localized osteoarthritis of right knee 10/03/2019   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    Rationale for Evaluation and  Treatment: Rehabilitation  THERAPY DIAG:  Other low back pain  Muscle spasm of back  ONSET DATE: 2 years+  SUBJECTIVE:                                                                                                                                                                                           SUBJECTIVE STATEMENT: Pt reports 5-6/10 low back pain today.   PERTINENT HISTORY:  H/o hip bursitis.    PAIN:  Are you having pain? Yes: NPRS scale: 5-6/10.  Pain location: LB/Hips Pain description: Ache, sharp and shooting.   Aggravating factors: As above. Relieving factors: As above.    PRECAUTIONS: None  RED FLAGS: None   WEIGHT BEARING RESTRICTIONS: No  FALLS:  Has patient fallen in last 6 months? No  LIVING ENVIRONMENT: Lives in: House/apartment Has following equipment at home: None  OCCUPATION: Retired.  PLOF: Independent  PATIENT GOALS: Sleep better and do more with less pain.      OBJECTIVE:  Note: Objective measures were completed at Evaluation unless otherwise noted.  DIAGNOSTIC FINDINGS:  01/25/24:  IMPRESSION: 1. No acute findings or clear explanation for the patient's symptoms. 2. Multilevel spondylosis with disc bulging, endplate osteophytes and facet hypertrophy as described. There is mild spinal stenosis at L3-4 with mild right-greater-than-left foraminal and right lateral recess narrowing. 3. Mild right lateral recess and biforaminal narrowing at L4-5. 4. Mild to moderate left foraminal narrowing at L5-S1. 5. Grossly stable fusiform dilatation of the distal thoracic and proximal abdominal aorta compared with prior CT's.  PATIENT SURVEYS:  ODI: 24/50.  PALPATION: Tender to palpation over bilateral lower lumbar region and  SIJ's, right > left and bilateral hip musculature.  LUMBAR ROM:   Full active lumbar flexion and extension to 15 degrees.    LOWER EXTREMITY ROM:     WNL.  LOWER EXTREMITY MMT:    Normal bilateral LE  strength.  LUMBAR SPECIAL TESTS:  Equal leg lengths.  (-) SLR testing.  Positive right FABER test.    GAIT: Slow and purposeful.  TREATMENT DATE:  04/07/24 EXERCISE LOG  Exercise Repetitions and Resistance Comments  Nustep  L4 x 15 minutes    Rocker board  5 minutes   Open books (standing)     Forward Step Ups 6 box x 3 mins   Lateral Step Ups 6 box x 3 mins   Resisted pull down  Green t-band x 2.5 mins   Resisted rows Green t-band x 2 mins   LAQs 2# x 25 reps bil   Seated Marches 2# x 25 reps bil   Seated Ham Curls Red x 25 reps bil   Multifidus press out     Self STM with tennis ball     Blank cell = exercise not performed today   03/30/24                                  EXERCISE LOG  Exercise Repetitions and Resistance Comments  Nustep Lvl 3 x 17 mins   Rockerboard 4.5 mins   Forward Step Ups 6 box x 4 mins   Ball Rollouts    LAQs 2# x 20 reps bil   Seated Marches 2# x 20 reps bil   Hip Abduction    Seated Ham Curls Red x 20 reps bil   STS    Goal Assessment See Below    Blank cell = exercise not performed today   03/23/2024                                    EXERCISE LOG  Exercise Repetitions and Resistance Comments  Nustep  Lv 3 x15 min    Ball Roll-outs 1 min ea Forward, each side   Lumbar rotation 2 min x2    Open book stretch 4 min    Paloff press  1x15 ea dir.   Bed mobility Log roll        Blank cell = exercise not performed today    PATIENT EDUCATION:  Education details: HEP, POC, healing, and progress with physical therapy Person educated: Patient Education method: Explanation Education comprehension: verbalized understanding  HOME EXERCISE PROGRAM: SKTC  [[id number]]  SINGLE KNEE TO CHEST STRETCH - SKTC -  Repeat 3 Repetitions, Hold 1 Minute, Complete 1 Set, Perform 3 Times a Day  DOUBLE KNEE TO CHEST STRETCH - DKTC -  Repeat 2 Repetitions, Hold 1 Minute, Complete 1 Set, Perform 3 Times a  Day  Medbridge: [id number]  ASSESSMENT:  CLINICAL IMPRESSION: Pt arrives for today's treatment session reporting 5-6/10 low back pain.  Pt states that she cleaned out under her sink yesterday and the movements caused her pain to increase.  Pt introduced to lateral step up today with good results.  Pt able to tolerate increased reps with all previously performed exercises without issue.  Pt reported 2/10 low back pain at completion of today's treatment session.   OBJECTIVE IMPAIRMENTS: decreased activity tolerance, decreased ROM, increased muscle spasms, and pain.   ACTIVITY LIMITATIONS: carrying, lifting, bending, standing, and sleeping  PARTICIPATION LIMITATIONS: meal prep, cleaning, laundry, and yard work  PERSONAL FACTORS: Time since onset of injury/illness/exacerbation and 1 comorbidity: h/o hip bursitis are also affecting patient's functional outcome.   REHAB POTENTIAL: Good  CLINICAL DECISION MAKING: Evolving/moderate complexity  EVALUATION COMPLEXITY: Low   GOALS:  SHORT TERM GOALS: Target date: 03/20/24  Ind with a HEP. Goal status: MET  LONG TERM GOALS: Target date: 04/17/24  Sleep 6 hours undisturbed.  8/25: approx. 2 hours Goal status: IN PROGRESS  2.  Perform ADL's with pain not > 3-4/10.   8/25: 5-6/10  Goal status: IN PROGRESS  3.  Improve ODI by at least 5 points.   8/25: 20/50 (4 point decrease)  Goal status: IN PROGRESS  PLAN:  PT FREQUENCY: 2x/week  PT DURATION: 6 weeks  PLANNED INTERVENTIONS: 97110-Therapeutic exercises, 97530- Therapeutic activity, [id number]- Neuromuscular re-education, 97535- Self Care, 02859- Manual therapy, G0283- Electrical stimulation (unattended), 97035- Ultrasound, Patient/Family education, and Moist heat.  PLAN FOR NEXT SESSION: S and DKTC, hip bridges, core exercise progression.  Spinal protection technique techniques.  Body mechanics training.  Modalities and STW/M as needed.     Delon DELENA Gosling, PTA 04/07/2024, 11:48  AM

## 2024-04-10 ENCOUNTER — Encounter: Admitting: Physical Therapy

## 2024-04-12 ENCOUNTER — Ambulatory Visit

## 2024-04-12 DIAGNOSIS — M5459 Other low back pain: Secondary | ICD-10-CM | POA: Diagnosis not present

## 2024-04-12 DIAGNOSIS — M62830 Muscle spasm of back: Secondary | ICD-10-CM | POA: Diagnosis not present

## 2024-04-12 NOTE — Therapy (Signed)
 OUTPATIENT PHYSICAL THERAPY THORACOLUMBAR TREATMENT   Patient Name: Julie Day MRN: [id number] DOB:23-Feb-1946, 78 y.o., female Today's Date: 04/12/2024  END OF SESSION:  PT End of Session - 04/12/24 1102     Visit Number 9    Number of Visits 12    Date for PT Re-Evaluation 04/17/24    PT Start Time 1100    PT Stop Time 1144    PT Time Calculation (min) 44 min    Activity Tolerance Patient tolerated treatment well    Behavior During Therapy [facility] for tasks assessed/performed           Past Medical History:  Diagnosis Date   09/08/2021    Allergic rhinitis    Aneurysm of thoracic aorta (HCC)    Anginal pain (HCC)    Dx as GERD   Atypical mole 09/23/2017   Left Inner Thigh-Mild   Atypical mole 09/23/2017   Right Axilla-Mild   Atypical mole 09/05/2015   Center Abdomen-Severe (clear) (Dr. Shona)   Atypical mole 04/14/2019   Right Thigh-Solar Lentigo   Chronic cystitis    Degenerative disc disease    Esophageal fistula    GERD (gastroesophageal reflux disease)    Hemangioma of liver    History of hiatal hernia 2017   Hypertension    SCC (squamous cell carcinoma) 09/05/2015   Left Dorsal Hand-Well Diff Keratoacanthoma (Dr. Shona) (free)   SCC (squamous cell carcinoma) 04/14/2019   Left Outer Shin-Keratoacanthoma (treatment after biopsy)   SCC (squamous cell carcinoma) 07/21/2019   Right Thigh-Keratoacanthoma (treatment Fluorouracil Cream)   Severe protein-calorie malnutrition (HCC)    Squamous cell carcinoma of skin 09/23/2017   Right Upper Shin-Well Diff (Cx3,5FU)   Past Surgical History:  Procedure Laterality Date   ABDOMINAL HYSTERECTOMY     CHOLECYSTECTOMY     COLON RESECTION N/A 05/31/2015   Procedure: LAPAROSCOPIC REPAIR OF HIATAL HERNIA  AND LAPAROSCOPIC NISSEN FUNDOPLICATION;  Surgeon: Elon Pacini, MD;  Location: [facility];  Service: General;  Laterality: N/A;   ESOPHAGEAL MANOMETRY N/A 02/27/2015   Procedure: ESOPHAGEAL MANOMETRY (EM);  Surgeon: Gladis MARLA Louder, MD;  Location: [facility] ENDOSCOPY;  Service: Endoscopy;  Laterality: N/A;   ESOPHAGOGASTRODUODENOSCOPY N/A 03/25/2022   Procedure: ESOPHAGOGASTRODUODENOSCOPY (EGD);  Surgeon: Dianna Specking, MD;  Location: [facility] ENDOSCOPY;  Service: Gastroenterology;  Laterality: N/A;   ESOPHAGOGASTRODUODENOSCOPY (EGD) WITH PROPOFOL  N/A 02/26/2015   Procedure: ESOPHAGOGASTRODUODENOSCOPY (EGD) WITH PROPOFOL ;  Surgeon: Gladis MARLA Louder, MD;  Location: [facility] ENDOSCOPY;  Service: Endoscopy;  Laterality: N/A;   HIATAL HERNIA REPAIR  05/31/2015   HIATAL HERNIA REPAIR N/A 09/08/2021   Procedure: LAPAROSCOPIC REVISION HIATAL HERNIA REPAIR WITH FUNDOPLICATION AND MESH;  Surgeon: Kinsinger, Herlene Righter, MD;  Location: [facility]S;  Service: General;  Laterality: N/A;   IR GASTROSTOMY TUBE MOD SED  03/27/2022   IR PATIENT EVAL TECH 0-60 MINS  04/07/2022   IR RADIOLOGIST EVAL & MGMT  04/07/2022   LAPAROSCOPIC INSERTION GASTROSTOMY TUBE N/A 09/08/2021   Procedure: LAPAROSCOPIC INSERTION GASTROSTOMY TUBE;  Surgeon: Stevie Herlene Righter, MD;  Location: [facility];  Service: General;  Laterality: N/A;  18FR   TOTAL KNEE ARTHROPLASTY Right 10/03/2019   Procedure: RIGHT TOTAL KNEE ARTHROPLASTY;  Surgeon: Sheril Coy, MD;  Location: [facility];  Service: Orthopedics;  Laterality: Right;   WISDOM TOOTH EXTRACTION     Patient Active Problem List   Diagnosis Date Noted   Esophageal fistula 04/11/2022   Abnormal barium swallow 04/11/2022   Esophageal dysphagia 04/11/2022   Cellulitis of abdominal wall  04/11/2022   Abdominal pain 04/11/2022   Unintentional weight loss 04/11/2022   Protein-calorie malnutrition, severe 03/26/2022   Esophageal perforation 03/24/2022   Hiatal hernia 09/08/2021   Primary localized osteoarthritis of right knee 10/03/2019   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    Rationale for Evaluation and  Treatment: Rehabilitation  THERAPY DIAG:  Other low back pain  Muscle spasm of back  ONSET DATE: 2 years+  SUBJECTIVE:                                                                                                                                                                                           SUBJECTIVE STATEMENT: Pt reports 3/10 low back pain today.   PERTINENT HISTORY:  H/o hip bursitis.    PAIN:  Are you having pain? Yes: NPRS scale: 3/10.  Pain location: LB/Hips Pain description: Ache, sharp and shooting.   Aggravating factors: As above. Relieving factors: As above.    PRECAUTIONS: None  RED FLAGS: None   WEIGHT BEARING RESTRICTIONS: No  FALLS:  Has patient fallen in last 6 months? No  LIVING ENVIRONMENT: Lives in: House/apartment Has following equipment at home: None  OCCUPATION: Retired.  PLOF: Independent  PATIENT GOALS: Sleep better and do more with less pain.      OBJECTIVE:  Note: Objective measures were completed at Evaluation unless otherwise noted.  DIAGNOSTIC FINDINGS:  01/25/24:  IMPRESSION: 1. No acute findings or clear explanation for the patient's symptoms. 2. Multilevel spondylosis with disc bulging, endplate osteophytes and facet hypertrophy as described. There is mild spinal stenosis at L3-4 with mild right-greater-than-left foraminal and right lateral recess narrowing. 3. Mild right lateral recess and biforaminal narrowing at L4-5. 4. Mild to moderate left foraminal narrowing at L5-S1. 5. Grossly stable fusiform dilatation of the distal thoracic and proximal abdominal aorta compared with prior CT's.  PATIENT SURVEYS:  ODI: 24/50.  PALPATION: Tender to palpation over bilateral lower lumbar region and  SIJ's, right > left and bilateral hip musculature.  LUMBAR ROM:   Full active lumbar flexion and extension to 15 degrees.    LOWER EXTREMITY ROM:     WNL.  LOWER EXTREMITY MMT:    Normal bilateral LE  strength.  LUMBAR SPECIAL TESTS:  Equal leg lengths.  (-) SLR testing.  Positive right FABER test.    GAIT: Slow and purposeful.  TREATMENT DATE:  04/12/24 EXERCISE LOG  Exercise Repetitions and Resistance Comments  Nustep  L4 x 15 minutes    Rocker board  5 minutes   Open books (standing)     Side-stepping Airex x 4 mins   Tandem Gait Airex x 4 mins   Forward Step Ups    Lateral Step Ups 6 box x 3.5 mins   Resisted pull down  Green t-band to fatigue   Resisted rows Green t-band to fatigue   LAQs 3# x 20 reps bil   Seated Marches 3# x 20 reps bil   Seated Ham Curls    Multifidus press out     Self STM with tennis ball     Blank cell = exercise not performed today   03/30/24                                  EXERCISE LOG  Exercise Repetitions and Resistance Comments  Nustep Lvl 3 x 17 mins   Rockerboard 4.5 mins   Forward Step Ups 6 box x 4 mins   Ball Rollouts    LAQs 2# x 20 reps bil   Seated Marches 2# x 20 reps bil   Hip Abduction    Seated Ham Curls Red x 20 reps bil   STS    Goal Assessment See Below    Blank cell = exercise not performed today   03/23/2024                                    EXERCISE LOG  Exercise Repetitions and Resistance Comments  Nustep  Lv 3 x15 min    Ball Roll-outs 1 min ea Forward, each side   Lumbar rotation 2 min x2    Open book stretch 4 min    Paloff press  1x15 ea dir.   Bed mobility Log roll        Blank cell = exercise not performed today    PATIENT EDUCATION:  Education details: HEP, POC, healing, and progress with physical therapy Person educated: Patient Education method: Explanation Education comprehension: verbalized understanding  HOME EXERCISE PROGRAM: SKTC  [[id number]]  SINGLE KNEE TO CHEST STRETCH - SKTC -  Repeat 3 Repetitions, Hold 1 Minute, Complete 1 Set, Perform 3 Times a Day  DOUBLE KNEE TO CHEST STRETCH - DKTC -  Repeat 2 Repetitions, Hold 1 Minute, Complete 1 Set,  Perform 3 Times a Day  Medbridge: [id number]  ASSESSMENT:  CLINICAL IMPRESSION: Pt arrives for today's treatment session reporting 3/10 low back pain.  Pt introduced to side-stepping and tandem gait on Airex balance beam today with pt requiring BUE support throughout exercise.  Pt able to perform t-band exercises to fatigue today with good results.  Pt able to tolerate increased weight with seated exercises today as well without issue. Pt reported decrease pain at completion of today's treatment session.   OBJECTIVE IMPAIRMENTS: decreased activity tolerance, decreased ROM, increased muscle spasms, and pain.   ACTIVITY LIMITATIONS: carrying, lifting, bending, standing, and sleeping  PARTICIPATION LIMITATIONS: meal prep, cleaning, laundry, and yard work  PERSONAL FACTORS: Time since onset of injury/illness/exacerbation and 1 comorbidity: h/o hip bursitis are also affecting patient's functional outcome.   REHAB POTENTIAL: Good  CLINICAL DECISION MAKING: Evolving/moderate complexity  EVALUATION COMPLEXITY: Low   GOALS:  SHORT TERM GOALS: Target date: 03/20/24  Ind with  a HEP. Goal status: MET   LONG TERM GOALS: Target date: 04/17/24  Sleep 6 hours undisturbed.  8/25: approx. 2 hours Goal status: IN PROGRESS  2.  Perform ADL's with pain not > 3-4/10.   8/25: 5-6/10  Goal status: IN PROGRESS  3.  Improve ODI by at least 5 points.   8/25: 20/50 (4 point decrease)  Goal status: IN PROGRESS  PLAN:  PT FREQUENCY: 2x/week  PT DURATION: 6 weeks  PLANNED INTERVENTIONS: 97110-Therapeutic exercises, 97530- Therapeutic activity, [id number]- Neuromuscular re-education, 97535- Self Care, [id number]- Manual therapy, [id number]- Electrical stimulation (unattended), 97035- Ultrasound, Patient/Family education, and Moist heat.  PLAN FOR NEXT SESSION: S and DKTC, hip bridges, core exercise progression.  Spinal protection technique techniques.  Body mechanics training.  Modalities and STW/M as needed.      Delon DELENA Gosling, PTA 04/12/2024, 11:44 AM

## 2024-04-14 ENCOUNTER — Encounter: Admitting: Physical Therapy

## 2024-04-19 ENCOUNTER — Ambulatory Visit

## 2024-04-19 DIAGNOSIS — M62830 Muscle spasm of back: Secondary | ICD-10-CM

## 2024-04-19 DIAGNOSIS — M5459 Other low back pain: Secondary | ICD-10-CM | POA: Diagnosis not present

## 2024-04-19 NOTE — Therapy (Addendum)
 OUTPATIENT PHYSICAL THERAPY THORACOLUMBAR TREATMENT   Patient Name: Julie Day MRN: [id number] DOB:11-Jan-1946, 78 y.o., female Today's Date: 04/19/2024  END OF SESSION:  PT End of Session - 04/19/24 1105     Visit Number 10    Number of Visits 12    Date for PT Re-Evaluation 04/17/24    PT Start Time 1100    PT Stop Time 1143    PT Time Calculation (min) 43 min    Activity Tolerance Patient tolerated treatment well    Behavior During Therapy [facility] for tasks assessed/performed           Past Medical History:  Diagnosis Date   09/08/2021    Allergic rhinitis    Aneurysm of thoracic aorta (HCC)    Anginal pain (HCC)    Dx as GERD   Atypical mole 09/23/2017   Left Inner Thigh-Mild   Atypical mole 09/23/2017   Right Axilla-Mild   Atypical mole 09/05/2015   Center Abdomen-Severe (clear) (Dr. Shona)   Atypical mole 04/14/2019   Right Thigh-Solar Lentigo   Chronic cystitis    Degenerative disc disease    Esophageal fistula    GERD (gastroesophageal reflux disease)    Hemangioma of liver    History of hiatal hernia 2017   Hypertension    SCC (squamous cell carcinoma) 09/05/2015   Left Dorsal Hand-Well Diff Keratoacanthoma (Dr. Shona) (free)   SCC (squamous cell carcinoma) 04/14/2019   Left Outer Shin-Keratoacanthoma (treatment after biopsy)   SCC (squamous cell carcinoma) 07/21/2019   Right Thigh-Keratoacanthoma (treatment Fluorouracil Cream)   Severe protein-calorie malnutrition (HCC)    Squamous cell carcinoma of skin 09/23/2017   Right Upper Shin-Well Diff (Cx3,5FU)   Past Surgical History:  Procedure Laterality Date   ABDOMINAL HYSTERECTOMY     CHOLECYSTECTOMY     COLON RESECTION N/A 05/31/2015   Procedure: LAPAROSCOPIC REPAIR OF HIATAL HERNIA  AND LAPAROSCOPIC NISSEN FUNDOPLICATION;  Surgeon: Elon Pacini, MD;  Location: [facility] OR;  Service: General;  Laterality: N/A;   ESOPHAGEAL MANOMETRY N/A 02/27/2015   Procedure: ESOPHAGEAL MANOMETRY (EM);  Surgeon: Gladis MARLA Louder, MD;  Location: [facility]OS[facility];  Service: Endoscopy;  Laterality: N/A;   ESOPHAGOGASTRODUODENOSCOPY N/A 03/25/2022   Procedure: ESOPHAGOGASTRODUODENOSCOPY (EGD);  Surgeon: Dianna Specking, MD;  Location: [facility] ENDOSCOPY;  Service: Gastroenterology;  Laterality: N/A;   ESOPHAGOGASTRODUODENOSCOPY (EGD) WITH PROPOFOL  N/A 02/26/2015   Procedure: ESOPHAGOGASTRODUODENOSCOPY (EGD) WITH PROPOFOL ;  Surgeon: Gladis MARLA Louder, MD;  Location: [facility] ENDOSCOPY;  Service: Endoscopy;  Laterality: N/A;   HIATAL HERNIA REPAIR  05/31/2015   HIATAL HERNIA REPAIR N/A 09/08/2021   Procedure: LAPAROSCOPIC REVISION HIATAL HERNIA REPAIR WITH FUNDOPLICATION AND MESH;  Surgeon: Kinsinger, Herlene Righter, MD;  Location: [facility];  Service: General;  Laterality: N/A;   IR GASTROSTOMY TUBE MOD SED  03/27/2022   IR PATIENT EVAL TECH 0-60 MINS  04/07/2022   IR RADIOLOGIST EVAL & MGMT  04/07/2022   LAPAROSCOPIC INSERTION GASTROSTOMY TUBE N/A 09/08/2021   Procedure: LAPAROSCOPIC INSERTION GASTROSTOMY TUBE;  Surgeon: Stevie Herlene Righter, MD;  Location: [facility];  Service: General;  Laterality: N/A;  18FR   TOTAL KNEE ARTHROPLASTY Right 10/03/2019   Procedure: RIGHT TOTAL KNEE ARTHROPLASTY;  Surgeon: Sheril Coy, MD;  Location: [facility];  Service: Orthopedics;  Laterality: Right;   WISDOM TOOTH EXTRACTION     Patient Active Problem List   Diagnosis Date Noted   Esophageal fistula 04/11/2022   Abnormal barium swallow 04/11/2022   Esophageal dysphagia 04/11/2022   Cellulitis of abdominal wall  04/11/2022   Abdominal pain 04/11/2022   Unintentional weight loss 04/11/2022   Protein-calorie malnutrition, severe 03/26/2022   Esophageal perforation 03/24/2022   Hiatal hernia 09/08/2021   Primary localized osteoarthritis of right knee 10/03/2019   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    Rationale for Evaluation and  Treatment: Rehabilitation  THERAPY DIAG:  Other low back pain  Muscle spasm of back  ONSET DATE: 2 years+  SUBJECTIVE:                                                                                                                                                                                           SUBJECTIVE STATEMENT: Pt denies any pain today.  Pt possibly ready for discharge today.   PERTINENT HISTORY:  H/o hip bursitis.    PAIN:  Are you having pain? No  PRECAUTIONS: None  RED FLAGS: None   WEIGHT BEARING RESTRICTIONS: No  FALLS:  Has patient fallen in last 6 months? No  LIVING ENVIRONMENT: Lives in: House/apartment Has following equipment at home: None  OCCUPATION: Retired.  PLOF: Independent  PATIENT GOALS: Sleep better and do more with less pain.      OBJECTIVE:  Note: Objective measures were completed at Evaluation unless otherwise noted.  DIAGNOSTIC FINDINGS:  01/25/24:  IMPRESSION: 1. No acute findings or clear explanation for the patient's symptoms. 2. Multilevel spondylosis with disc bulging, endplate osteophytes and facet hypertrophy as described. There is mild spinal stenosis at L3-4 with mild right-greater-than-left foraminal and right lateral recess narrowing. 3. Mild right lateral recess and biforaminal narrowing at L4-5. 4. Mild to moderate left foraminal narrowing at L5-S1. 5. Grossly stable fusiform dilatation of the distal thoracic and proximal abdominal aorta compared with prior CT's.  PATIENT SURVEYS:  ODI: 24/50.  PALPATION: Tender to palpation over bilateral lower lumbar region and  SIJ's, right > left and bilateral hip musculature.  LUMBAR ROM:   Full active lumbar flexion and extension to 15 degrees.    LOWER EXTREMITY ROM:     WNL.  LOWER EXTREMITY MMT:    Normal bilateral LE strength.  LUMBAR SPECIAL TESTS:  Equal leg lengths.  (-) SLR testing.  Positive right FABER test.    GAIT: Slow and  purposeful.  TREATMENT DATE:                                    04/12/24 EXERCISE LOG  Exercise Repetitions and Resistance Comments  Nustep  L4 x 15 minutes    Rocker board  5 minutes   Lunges 14 box 2.5 mins each side   Goal Assessment See Below   Open books (standing)     Side-stepping    Tandem Gait    Forward Step Ups    Lateral Step Ups    Resisted pull down     Resisted rows    LAQs    Seated Marches    Seated Ham Curls    Multifidus press out     Self STM with tennis ball     Blank cell = exercise not performed today   03/30/24                                  EXERCISE LOG  Exercise Repetitions and Resistance Comments  Nustep Lvl 3 x 17 mins   Rockerboard 4.5 mins   Forward Step Ups 6 box x 4 mins   Ball Rollouts    LAQs 2# x 20 reps bil   Seated Marches 2# x 20 reps bil   Hip Abduction    Seated Ham Curls Red x 20 reps bil   STS    Goal Assessment See Below    Blank cell = exercise not performed today   03/23/2024                                    EXERCISE LOG  Exercise Repetitions and Resistance Comments  Nustep  Lv 3 x15 min    Ball Roll-outs 1 min ea Forward, each side   Lumbar rotation 2 min x2    Open book stretch 4 min    Paloff press  1x15 ea dir.   Bed mobility Log roll        Blank cell = exercise not performed today    PATIENT EDUCATION:  Education details: HEP, POC, healing, and progress with physical therapy Person educated: Patient Education method: Explanation Education comprehension: verbalized understanding  HOME EXERCISE PROGRAM: SKTC  [FNZMSVS]  SINGLE KNEE TO CHEST STRETCH - SKTC -  Repeat 3 Repetitions, Hold 1 Minute, Complete 1 Set, Perform 3 Times a Day  DOUBLE KNEE TO CHEST STRETCH - DKTC -  Repeat 2 Repetitions, Hold 1 Minute, Complete 1 Set, Perform 3 Times a Day  Medbridge: [id number]  ASSESSMENT:  CLINICAL IMPRESSION: Pt arrives for today's treatment session denying any back pain.  Pt states that she is able to  perform her ADLs without her pain exceeding a 4/10.  Pt also able to decrease her ODI to 14/50, well surpassing her LTG.  Discussed with pt the importance of continued activity and exercise post discharge.  Pt given handout for local gym.  Pt encouraged to call the facility with any questions or concerns.  Pt denied any pain at completion of today's treatment session.  Pt ready for discharge at this time.   OBJECTIVE IMPAIRMENTS: decreased activity tolerance, decreased ROM, increased muscle spasms, and pain.   ACTIVITY LIMITATIONS: carrying, lifting, bending, standing, and sleeping  PARTICIPATION LIMITATIONS: meal prep, cleaning, laundry, and yard work  PERSONAL FACTORS: Time since onset of injury/illness/exacerbation and 1 comorbidity: h/o hip bursitis are also affecting patient's functional outcome.   REHAB POTENTIAL: Good  CLINICAL DECISION MAKING: Evolving/moderate complexity  EVALUATION COMPLEXITY: Low   GOALS:  SHORT TERM GOALS: Target date: 03/20/24  Ind with a HEP. Goal status: MET   LONG TERM GOALS: Target date: 04/17/24  Sleep 6 hours undisturbed.  8/25: approx. 2 hours; 9/17: approx. 2-3 hours Goal status: IN PROGRESS  2.  Perform ADL's with pain not > 3-4/10.   8/25: 5-6/10; 9/17: 3-4/10  Goal status: MET  3.  Improve ODI by at least 5 points.   8/25: 20/50 (4 point decrease); 9/17: 14/50 Goal status: MET  PLAN:  PT FREQUENCY: 2x/week  PT DURATION: 6 weeks  PLANNED INTERVENTIONS: 97110-Therapeutic exercises, 97530- Therapeutic activity, 97112- Neuromuscular re-education, 97535- Self Care, 02859- Manual therapy, G0283- Electrical stimulation (unattended), 97035- Ultrasound, Patient/Family education, and Moist heat.  PLAN FOR NEXT SESSION: S and DKTC, hip bridges, core exercise progression.  Spinal protection technique techniques.  Body mechanics training.  Modalities and STW/M as needed.     Delon DELENA Gosling, PTA 04/19/2024, 11:54 AM   PHYSICAL THERAPY  DISCHARGE SUMMARY  Visits from Start of Care: 10.  Current functional level related to goals / functional outcomes: See above.     Remaining deficits: Please see goal section.     Education / Equipment: HEP.   Patient agrees to discharge. Patient goals were partially met. Patient is being discharged due to being pleased with the current functional level.    Chad Applegate MPT

## 2024-04-20 ENCOUNTER — Other Ambulatory Visit: Payer: Self-pay | Admitting: Surgery

## 2024-04-20 DIAGNOSIS — I7121 Aneurysm of the ascending aorta, without rupture: Secondary | ICD-10-CM

## 2024-04-25 ENCOUNTER — Encounter: Payer: Self-pay | Admitting: Cardiology

## 2024-04-25 NOTE — Telephone Encounter (Signed)
 Hello Julie Day, these note patient had a stress PET scan and CT scan revealed a very stable looking thoracic aortic aneurysm although it was not a complete study the aneurysm appears to be fairly stable and hence I think we can skip the CT angiogram that was ordered for October of this year unless Dr. Sherrine feels we need it. The extracardiac abnormalities noted on the PET/CT is in the chart and the cardiac studies section.

## 2024-04-26 ENCOUNTER — Encounter

## 2024-04-26 NOTE — Telephone Encounter (Signed)
 CT angio chest 10/26/2023: Stable 4.9 cm aneurysmal dilatation of the ascending thoracic aorta. This is stable since the prior chest CT 05/27/2023   Myocardial perfusion CT 03/09/2024 over read: Vascular: Aortic atherosclerosis. Similar size of the ascending thoracic aortic aneurysm measuring 4.9 cm. Coronary artery calcifications.

## 2024-04-26 NOTE — Telephone Encounter (Signed)
 Great and thank you. Agree not a bad idea to get complete study

## 2024-04-27 DIAGNOSIS — D225 Melanocytic nevi of trunk: Secondary | ICD-10-CM | POA: Diagnosis not present

## 2024-04-27 DIAGNOSIS — L570 Actinic keratosis: Secondary | ICD-10-CM | POA: Diagnosis not present

## 2024-04-27 DIAGNOSIS — X32XXXD Exposure to sunlight, subsequent encounter: Secondary | ICD-10-CM | POA: Diagnosis not present

## 2024-04-27 DIAGNOSIS — Z1283 Encounter for screening for malignant neoplasm of skin: Secondary | ICD-10-CM | POA: Diagnosis not present

## 2024-04-27 DIAGNOSIS — L821 Other seborrheic keratosis: Secondary | ICD-10-CM | POA: Diagnosis not present

## 2024-05-02 DIAGNOSIS — E7800 Pure hypercholesterolemia, unspecified: Secondary | ICD-10-CM | POA: Diagnosis not present

## 2024-05-02 DIAGNOSIS — M4696 Unspecified inflammatory spondylopathy, lumbar region: Secondary | ICD-10-CM | POA: Diagnosis not present

## 2024-05-02 DIAGNOSIS — J432 Centrilobular emphysema: Secondary | ICD-10-CM | POA: Diagnosis not present

## 2024-05-02 DIAGNOSIS — I2089 Other forms of angina pectoris: Secondary | ICD-10-CM | POA: Diagnosis not present

## 2024-05-03 ENCOUNTER — Ambulatory Visit (HOSPITAL_COMMUNITY)

## 2024-05-04 ENCOUNTER — Ambulatory Visit: Admitting: Cardiology

## 2024-05-08 ENCOUNTER — Ambulatory Visit (HOSPITAL_COMMUNITY)
Admission: RE | Admit: 2024-05-08 | Discharge: 2024-05-08 | Disposition: A | Source: Ambulatory Visit | Attending: Surgery | Admitting: Surgery

## 2024-05-08 DIAGNOSIS — I7121 Aneurysm of the ascending aorta, without rupture: Secondary | ICD-10-CM | POA: Diagnosis not present

## 2024-05-08 DIAGNOSIS — I7123 Aneurysm of the descending thoracic aorta, without rupture: Secondary | ICD-10-CM | POA: Diagnosis not present

## 2024-05-08 MED ORDER — IOHEXOL 350 MG/ML SOLN
100.0000 mL | Freq: Once | INTRAVENOUS | Status: AC | PRN
  Administered 2024-05-08: 100 mL via INTRAVENOUS

## 2024-05-09 ENCOUNTER — Other Ambulatory Visit: Payer: Self-pay

## 2024-05-17 ENCOUNTER — Ambulatory Visit: Attending: Surgery | Admitting: Surgery

## 2024-05-17 ENCOUNTER — Encounter: Payer: Self-pay | Admitting: Surgery

## 2024-05-17 VITALS — BP 109/68 | HR 77 | Resp 18 | Ht 63.0 in | Wt 138.0 lb

## 2024-05-17 DIAGNOSIS — I7121 Aneurysm of the ascending aorta, without rupture: Secondary | ICD-10-CM | POA: Diagnosis not present

## 2024-05-18 NOTE — Progress Notes (Signed)
 [address], [place] [address]             [phone]     PCP is Signa Rush, MD (Inactive) Referring Provider is Alys Schuyler HERO, [place]  Chief Complaint  Patient presents with   Follow-up    HPI:  The patient is a 78 year old woman with a history of hypertension, GERD, hiatal hernia (s/p laparoscopic repair, revision and redo repair with fundoplication with replacement of G tube), and ascending aortic aneurysm which was noted to be 4.4 x 4.5 cm on CTA of the chest 05/10/2022.  A follow-up CT of the chest was done to follow-up on some pulmonary nodules on 05/27/2023 and the ascending aortic aneurysm was measured at 4.8 cm.  There is also an infrarenal abdominal aortic aneurysm measured at 3.5 cm.  She was seen by one of our PAs in April 2025 and CT of the chest at that time showed a stable 4.9 cm fusiform ascending aortic aneurysm that has increased from 4.6 to 4.9 cm since 2023.  There was stable advanced emphysematous lung disease and stable scattered pulmonary nodules.  She has continued to smoke about 1/2 pack of cigarettes per day.  She denies any chest pain or shortness of breath.  She is here today with her husband and daughter.  She does have a positive family history of her mother dying of an ascending aortic aneurysm at 78 years old.  There is no family history of aortic valve disease or connective tissue disorder. Past Medical History:  Diagnosis Date   09/08/2021    Allergic rhinitis    Aneurysm of thoracic aorta    Anginal pain    Dx as GERD   Atypical mole 09/23/2017   Left Inner Thigh-Mild   Atypical mole 09/23/2017   Right Axilla-Mild   Atypical mole 09/05/2015   Center Abdomen-Severe (clear) (Dr. Shona)   Atypical mole 04/14/2019   Right Thigh-Solar Lentigo   Chronic cystitis    Degenerative disc disease    Esophageal fistula    GERD (gastroesophageal reflux disease)    Hemangioma of liver    History of hiatal hernia 2017    Hypertension    SCC (squamous cell carcinoma) 09/05/2015   Left Dorsal Hand-Well Diff Keratoacanthoma (Dr. Shona) (free)   SCC (squamous cell carcinoma) 04/14/2019   Left Outer Shin-Keratoacanthoma (treatment after biopsy)   SCC (squamous cell carcinoma) 07/21/2019   Right Thigh-Keratoacanthoma (treatment Fluorouracil Cream)   Severe protein-calorie malnutrition    Squamous cell carcinoma of skin 09/23/2017   Right Upper Shin-Well Diff (Cx3,5FU)    Past Surgical History:  Procedure Laterality Date   ABDOMINAL HYSTERECTOMY     CHOLECYSTECTOMY     COLON RESECTION N/A 05/31/2015   Procedure: LAPAROSCOPIC REPAIR OF HIATAL HERNIA  AND LAPAROSCOPIC NISSEN FUNDOPLICATION;  Surgeon: Elon Pacini, MD;  Location: [facility] OR;  Service: General;  Laterality: N/A;   ESOPHAGEAL MANOMETRY N/A 02/27/2015   Procedure: ESOPHAGEAL MANOMETRY (EM);  Surgeon: Gladis MARLA Louder, MD;  Location: [facility];  Service: Endoscopy;  Laterality: N/A;   ESOPHAGOGASTRODUODENOSCOPY N/A 03/25/2022   Procedure: ESOPHAGOGASTRODUODENOSCOPY (EGD);  Surgeon: Dianna Specking, MD;  Location: [facility];  Service: Gastroenterology;  Laterality: N/A;   ESOPHAGOGASTRODUODENOSCOPY (EGD) WITH PROPOFOL  N/A 02/26/2015   Procedure: ESOPHAGOGASTRODUODENOSCOPY (EGD) WITH PROPOFOL ;  Surgeon: Gladis MARLA Louder, MD;  Location: [facility]OS[facility];  Service: Endoscopy;  Laterality: N/A;   HIATAL HERNIA REPAIR  05/31/2015   HIATAL HERNIA REPAIR N/A 09/08/2021  Procedure: LAPAROSCOPIC REVISION HIATAL HERNIA REPAIR WITH FUNDOPLICATION AND MESH;  Surgeon: Kinsinger, Herlene Righter, MD;  Location: [facility]S;  Service: General;  Laterality: N/A;   IR GASTROSTOMY TUBE MOD SED  03/27/2022   IR PATIENT EVAL TECH 0-60 MINS  04/07/2022   IR RADIOLOGIST EVAL & MGMT  04/07/2022   LAPAROSCOPIC INSERTION GASTROSTOMY TUBE N/A 09/08/2021   Procedure: LAPAROSCOPIC INSERTION GASTROSTOMY TUBE;  Surgeon: Stevie Herlene Righter, MD;  Location: [facility];  Service: General;  Laterality:  N/A;  18FR   TOTAL KNEE ARTHROPLASTY Right 10/03/2019   Procedure: RIGHT TOTAL KNEE ARTHROPLASTY;  Surgeon: Sheril Coy, MD;  Location: [facility];  Service: Orthopedics;  Laterality: Right;   WISDOM TOOTH EXTRACTION      Family History  Problem Relation Age of Onset   Hypertension Mother    Anuerysm Mother        Heart   Hypertension Sister    Stomach cancer Neg Hx    Esophageal cancer Neg Hx    Colon cancer Neg Hx    Inflammatory bowel disease Neg Hx    Liver disease Neg Hx    Pancreatic cancer Neg Hx    Rectal cancer Neg Hx     Social History Social History   Tobacco Use   Smoking status: Some Days    Current packs/day: 0.00    Types: Cigarettes    Last attempt to quit: 12/20/2008    Years since quitting: 15.4   Smokeless tobacco: Never  Vaping Use   Vaping status: Never Used  Substance Use Topics   Alcohol use: No   Drug use: No    Current Outpatient Medications  Medication Sig Dispense Refill   acetaminophen  (TYLENOL ) 650 MG CR tablet Take 650 mg by mouth every 8 (eight) hours as needed for pain.     cetirizine (ZYRTEC) 10 MG tablet Take 10 mg by mouth daily.     Cholecalciferol 50 MCG (2000 UT) CAPS Take 1 capsule by mouth daily.     docusate sodium (COLACE) 100 MG capsule Take 100 mg by mouth daily.     losartan  (COZAAR ) 50 MG tablet Take 1 tablet (50 mg total) by mouth daily. 90 tablet 3   MELATONIN PO Take 1 tablet by mouth at bedtime.     meloxicam (MOBIC) 15 MG tablet Take 15 mg by mouth daily as needed for pain (bursitis).     nitrofurantoin (MACRODANTIN) 100 MG capsule Take 100 mg by mouth daily.     nitroGLYCERIN  (NITROSTAT ) 0.4 MG SL tablet Place 0.4 mg under the tongue every 5 (five) minutes as needed for chest pain.     oxybutynin  (DITROPAN -XL) 10 MG 24 hr tablet Take 10 mg by mouth at bedtime.     simvastatin  (ZOCOR ) 20 MG tablet Take 1 tablet (20 mg total) by mouth at bedtime. 30 tablet 2   triamterene-hydrochlorothiazide (MAXZIDE-25) 37.5-25 MG  tablet Take 1 tablet by mouth daily.     No current facility-administered medications for this visit.    Allergies  Allergen Reactions   Rosuvastatin Other (See Comments)    Arthralgia and myalgia   Levofloxacin     Cannot take any Fluoroquinolones due to aortic aneurysm   Trexall [Methotrexate] Other (See Comments)    Hair loss Fatigue    Prevnar 13 [Pneumococcal 13-Val Conj Vacc] Itching, Swelling and Rash    Redness around injection site Swelling around injection site Fever     Review of Systems  Constitutional:  Negative for fatigue.  Respiratory:  Positive for cough. Negative for shortness of breath.   Cardiovascular:  Negative for chest pain and leg swelling.  Neurological:  Negative for dizziness and syncope.    BP 109/68   Pulse 77   Resp 18   Ht 5' 3 (1.6 m)   Wt 138 lb (62.6 kg)   SpO2 95%   BMI 24.45 kg/m  Physical Exam Constitutional:      Appearance: Normal appearance. She is normal weight.  Eyes:     Extraocular Movements: Extraocular movements intact.     Pupils: Pupils are equal, round, and reactive to light.  Cardiovascular:     Rate and Rhythm: Normal rate and regular rhythm.     Pulses: Normal pulses.     Heart sounds: Normal heart sounds. No murmur heard. Pulmonary:     Effort: Pulmonary effort is normal.     Breath sounds: Normal breath sounds.  Musculoskeletal:        General: No swelling.  Skin:    General: Skin is warm and dry.  Neurological:     General: No focal deficit present.     Mental Status: She is alert and oriented to person, place, and time.  Psychiatric:        Mood and Affect: Mood normal.        Behavior: Behavior normal.      Diagnostic Tests:      ECHOCARDIOGRAM REPORT       Patient Name:   Julie Day  Date of Exam: 11/01/2023 Medical Rec #:  [id number]     Height:       63.0 in Accession #:    [id number]    Weight:       118.0 lb Date of Birth:  1945/11/20    BSA:          1.545 m Patient Age:    77  years      BP:           129/81 mmHg Patient Gender: F             HR:           66 bpm. Exam Location:  [address]  Procedure: 2D Echo, 3D Echo, Cardiac Doppler and Color Doppler (Both Spectral            and Color Flow Doppler were utilized during procedure).  Indications:    I71 Ascending Aortic Aneurysm   History:        Patient has no prior history of Echocardiogram examinations. No                 cardiac history.   Sonographer:    Waldo Guadalajara RCS Referring Phys: [id number] K Pammie Chirino  IMPRESSIONS    1. Left ventricular ejection fraction, by estimation, is 55 to 60%. Left ventricular ejection fraction by PLAX is 57 %. The left ventricle has normal function. The left ventricle has no regional wall motion abnormalities. There is mild left ventricular hypertrophy. Left ventricular diastolic parameters are consistent with Grade I diastolic dysfunction (impaired relaxation).  2. Right ventricular systolic function is normal. The right ventricular size is normal. There is normal pulmonary artery systolic pressure. The estimated right ventricular systolic pressure is 17.6 mmHg.  3. The mitral valve is grossly normal. No evidence of mitral valve regurgitation.  4. The aortic valve is tricuspid. Aortic valve regurgitation is moderate. No aortic stenosis is present.  5. Aortic dilatation noted. There is moderate dilatation of  the ascending aorta, measuring 47 mm.  6. The inferior vena cava is normal in size with greater than 50% respiratory variability, suggesting right atrial pressure of 3 mmHg.  Comparison(s): No prior Echocardiogram.  FINDINGS  Left Ventricle: Left ventricular ejection fraction, by estimation, is 55 to 60%. Left ventricular ejection fraction by PLAX is 57 %. The left ventricle has normal function. The left ventricle has no regional wall motion abnormalities. 3D ejection fraction reviewed and evaluated as part of the interpretation.  Alternate measurement of EF is felt to be most reflective of LV function. The left ventricular internal cavity size was normal in size. There is mild left ventricular hypertrophy. Left ventricular diastolic parameters are consistent with Grade I diastolic dysfunction (impaired relaxation). Indeterminate filling pressures.  Right Ventricle: The right ventricular size is normal. No increase in right ventricular wall thickness. Right ventricular systolic function is normal. There is normal pulmonary artery systolic pressure. The tricuspid regurgitant velocity is 1.91 m/s, and  with an assumed right atrial pressure of 3 mmHg, the estimated right ventricular systolic pressure is 17.6 mmHg.  Left Atrium: Left atrial size was normal in size.  Right Atrium: Right atrial size was normal in size.  Pericardium: There is no evidence of pericardial effusion.  Mitral Valve: The mitral valve is grossly normal. No evidence of mitral valve regurgitation.  Tricuspid Valve: The tricuspid valve is grossly normal. Tricuspid valve regurgitation is trivial.  Aortic Valve: The aortic valve is tricuspid. Aortic valve regurgitation is moderate. Aortic regurgitation PHT measures 5786 msec. No aortic stenosis is present.  Pulmonic Valve: The pulmonic valve was grossly normal. Pulmonic valve regurgitation is trivial.  Aorta: Aortic dilatation noted and the aortic root is normal in size and structure. There is moderate dilatation of the ascending aorta, measuring 47 mm.  Venous: The inferior vena cava is normal in size with greater than 50% respiratory variability, suggesting right atrial pressure of 3 mmHg.  IAS/Shunts: No atrial level shunt detected by color flow Doppler.  Additional Comments: 3D was performed not requiring image post processing on an independent workstation and was normal.    LEFT VENTRICLE PLAX 2D LV EF:         Left            Diastology                ventricular     LV e'  medial:    5.44 cm/s                ejection        LV E/e' medial:  8.9                fraction by     LV e' lateral:   7.94 cm/s                PLAX is 57      LV E/e' lateral: 6.1                %. LVIDd:         3.10 cm LVIDs:         2.20 cm LV PW:         1.10 cm LV IVS:        1.00 cm         3D Volume EF: LVOT diam:     1.90 cm         3D EF:        52 %  LV SV:         57              LV EDV:       87 ml LV SV Index:   37              LV ESV:       41 ml LVOT Area:     2.84 cm        LV SV:        45 ml    RIGHT VENTRICLE RV Basal diam:  2.60 cm RV S prime:     9.03 cm/s TAPSE (M-mode): 1.7 cm RVSP:           17.6 mmHg  LEFT ATRIUM             Index        RIGHT ATRIUM           Index LA diam:        2.70 cm 1.75 cm/m   RA Pressure: 3.00 mmHg LA Vol (A2C):   28.7 ml 18.57 ml/m  RA Area:     5.31 cm LA Vol (A4C):   16.6 ml 10.74 ml/m  RA Volume:   8.61 ml   5.57 ml/m LA Biplane Vol: 22.3 ml 14.43 ml/m  AORTIC VALVE LVOT Vmax:   95.80 cm/s LVOT Vmean:  62.500 cm/s LVOT VTI:    0.201 m AI PHT:      5786 msec   AORTA Ao Root diam: 3.30 cm Ao Asc diam:  4.37 cm  MITRAL VALVE               TRICUSPID VALVE MV Area (PHT):             TR Peak grad:   14.6 mmHg MV Decel Time:             TR Vmax:        191.00 cm/s MV E velocity: 48.20 cm/s  Estimated RAP:  3.00 mmHg MV A velocity: 94.30 cm/s  RVSP:           17.6 mmHg MV E/A ratio:  0.51                            SHUNTS                            Systemic VTI:  0.20 m                            Systemic Diam: 1.90 cm  Vinie Maxcy MD Electronically signed by Vinie Maxcy MD Signature Date/Time: 11/01/2023/3:11:10 PM       Final     Narrative & Impression  EXAM: CTA CHEST AORTA 05/08/2024 11:06:40 AM   TECHNIQUE: CTA of the chest was performed after the administration of intravenous contrast. Multiplanar reformatted images are provided for review. MIP images are provided for review. Automated  exposure control, iterative reconstruction, and/or weight based adjustment of the mA/kV was utilized to reduce the radiation dose to as low as reasonably achievable.   COMPARISON: CT angiogram of the chest dated 10/26/2023.   CLINICAL HISTORY: TAA. TAA ; 75ml omnipaque  350.   FINDINGS:   AORTA: No thoracic aortic dissection. Fusiform aneurysmal dilatation of the ascending thoracic aorta, which again measures a maximum of 4.9 x 4.9  cm in cross-sectional diameter on the coronal and sagittal reformations. The diameter measures approximately 3.9 cm at the sinuses of valsalva. The descending thoracic aorta is tortuous and less dilated, measuring approximately 3.6 cm in diameter proximally and 3.7 cm above the abdominal hiatus.   MEDIASTINUM: No mediastinal lymphadenopathy. The heart and pericardium demonstrate no acute abnormality.   LYMPH NODES: No mediastinal, hilar or axillary lymphadenopathy.   LUNGS AND PLEURA: Moderate central lobular emphysema. Mild dependent atelectasis. The pulmonary arteries are patent. No focal consolidation or pulmonary edema. No pleural effusion or pneumothorax.   UPPER ABDOMEN: Limited images of the upper abdomen are unremarkable.   SOFT TISSUES AND BONES: No acute bone or soft tissue abnormality.   IMPRESSION: 1. Fusiform aneurysmal dilatation of the ascending thoracic aorta, measuring a maximum of 4.9 x 4.9 cm in cross-sectional diameter. 2. Tortuous descending thoracic aorta with mild aneurysmal dilatation, measuring approximately 3.6 cm proximally and 3.7 cm above the abdominal hiatus.   Electronically signed by: Evalene Coho MD 05/08/2024 11:35 AM EDT RP Workstation: [id number]     Impression:  This 78 year old woman has a stable 4.9 cm fusiform ascending aortic aneurysm that has increased in size from 4.6 cm on a CT of the chest in 2023.  Echocardiogram shows a normal trileaflet aortic valve with moderate aortic insufficiency.   She is asymptomatic with normal left ventricular internal dimensions and ejection fraction and there is no indication for aortic valve replacement.  Her aneurysm is still below the usual surgical threshold of 5.5 cm in patients with a trileaflet aortic valve.  Her entire aorta is somewhat large for her body size, being 3.6 cm in the proximal descending aorta and 3.7 cm above the diaphragm.  Her CT scan also shows significant emphysematous lung disease and I have strongly encouraged her to stop smoking.  Given her age, COPD and active smoking, and the stability of this aneurysm over the past year still being below the usual surgical threshold of 5.5 cm, I think would be best to continue following this.  I reviewed the CT and echo images with her and her family and answered all of their questions.  I stressed the importance of continued good blood pressure control in preventing further enlargement and acute aortic dissection.  Plan:  I will see her back in 6 months with a CTA of the chest for aortic surveillance.  She will continue to follow-up with cardiology and should have a repeat echocardiogram yearly to evaluate the aortic insufficiency and left ventricular function.  I spent 30 minutes performing this consultation and > 50% of this time was spent face to face counseling and coordinating the care of this patient's ascending aortic aneurysm.  Dorise MARLA Fellers, MD [facility] [phone]

## 2024-05-22 ENCOUNTER — Other Ambulatory Visit: Payer: Self-pay

## 2024-05-22 DIAGNOSIS — E7800 Pure hypercholesterolemia, unspecified: Secondary | ICD-10-CM

## 2024-05-23 MED ORDER — SIMVASTATIN 20 MG PO TABS: 20.0000 mg | ORAL_TABLET | Freq: Every day | ORAL | 1 refills | Status: AC

## 2024-05-25 ENCOUNTER — Other Ambulatory Visit (HOSPITAL_COMMUNITY): Payer: Self-pay

## 2024-06-02 DIAGNOSIS — M4696 Unspecified inflammatory spondylopathy, lumbar region: Secondary | ICD-10-CM | POA: Diagnosis not present

## 2024-06-02 DIAGNOSIS — I2089 Other forms of angina pectoris: Secondary | ICD-10-CM | POA: Diagnosis not present

## 2024-06-02 DIAGNOSIS — E7800 Pure hypercholesterolemia, unspecified: Secondary | ICD-10-CM | POA: Diagnosis not present

## 2024-06-02 DIAGNOSIS — J432 Centrilobular emphysema: Secondary | ICD-10-CM | POA: Diagnosis not present

## 2024-06-28 DIAGNOSIS — M4696 Unspecified inflammatory spondylopathy, lumbar region: Secondary | ICD-10-CM | POA: Diagnosis not present

## 2024-06-28 DIAGNOSIS — M7061 Trochanteric bursitis, right hip: Secondary | ICD-10-CM | POA: Diagnosis not present

## 2024-06-28 DIAGNOSIS — Z87440 Personal history of urinary (tract) infections: Secondary | ICD-10-CM | POA: Diagnosis not present

## 2024-06-28 DIAGNOSIS — I10 Essential (primary) hypertension: Secondary | ICD-10-CM | POA: Diagnosis not present

## 2024-06-28 DIAGNOSIS — M7062 Trochanteric bursitis, left hip: Secondary | ICD-10-CM | POA: Diagnosis not present

## 2024-07-02 DIAGNOSIS — M4696 Unspecified inflammatory spondylopathy, lumbar region: Secondary | ICD-10-CM | POA: Diagnosis not present

## 2024-07-02 DIAGNOSIS — I2089 Other forms of angina pectoris: Secondary | ICD-10-CM | POA: Diagnosis not present

## 2024-07-02 DIAGNOSIS — E7800 Pure hypercholesterolemia, unspecified: Secondary | ICD-10-CM | POA: Diagnosis not present

## 2024-07-02 DIAGNOSIS — J432 Centrilobular emphysema: Secondary | ICD-10-CM | POA: Diagnosis not present

## 2024-07-10 DIAGNOSIS — M25552 Pain in left hip: Secondary | ICD-10-CM | POA: Diagnosis not present

## 2024-07-10 DIAGNOSIS — M25551 Pain in right hip: Secondary | ICD-10-CM | POA: Diagnosis not present
